# Patient Record
Sex: Male | Born: 1970 | Race: White | Hispanic: No | Marital: Single | Smoking: Former smoker
Health system: Southern US, Community
[De-identification: ages and names within clinical notes are randomized; demographics above are authoritative.]

## PROBLEM LIST (undated history)

## (undated) DIAGNOSIS — E119 Type 2 diabetes mellitus without complications: Secondary | ICD-10-CM

## (undated) DIAGNOSIS — F1411 Cocaine abuse, in remission: Secondary | ICD-10-CM

## (undated) DIAGNOSIS — E785 Hyperlipidemia, unspecified: Secondary | ICD-10-CM

## (undated) DIAGNOSIS — I82419 Acute embolism and thrombosis of unspecified femoral vein: Secondary | ICD-10-CM

## (undated) DIAGNOSIS — I2583 Coronary atherosclerosis due to lipid rich plaque: Secondary | ICD-10-CM

## (undated) DIAGNOSIS — Z8673 Personal history of transient ischemic attack (TIA), and cerebral infarction without residual deficits: Secondary | ICD-10-CM

## (undated) DIAGNOSIS — I639 Cerebral infarction, unspecified: Secondary | ICD-10-CM

## (undated) DIAGNOSIS — D689 Coagulation defect, unspecified: Secondary | ICD-10-CM

## (undated) DIAGNOSIS — I82409 Acute embolism and thrombosis of unspecified deep veins of unspecified lower extremity: Secondary | ICD-10-CM

## (undated) DIAGNOSIS — I2510 Atherosclerotic heart disease of native coronary artery without angina pectoris: Secondary | ICD-10-CM

## (undated) DIAGNOSIS — G08 Intracranial and intraspinal phlebitis and thrombophlebitis: Secondary | ICD-10-CM

## (undated) DIAGNOSIS — M1990 Unspecified osteoarthritis, unspecified site: Secondary | ICD-10-CM

## (undated) DIAGNOSIS — I219 Acute myocardial infarction, unspecified: Secondary | ICD-10-CM

## (undated) HISTORY — DX: Hyperlipidemia, unspecified: E78.5

## (undated) HISTORY — PX: HIP SURGERY: SHX245

## (undated) HISTORY — PX: WRIST SURGERY: SHX841

## (undated) HISTORY — DX: Cocaine abuse, in remission: F14.11

## (undated) HISTORY — PX: KNEE ARTHROSCOPY: SUR90

## (undated) HISTORY — DX: Coagulation defect, unspecified: D68.9

## (undated) HISTORY — DX: Personal history of transient ischemic attack (TIA), and cerebral infarction without residual deficits: Z86.73

## (undated) HISTORY — DX: Intracranial and intraspinal phlebitis and thrombophlebitis: G08

## (undated) HISTORY — DX: Coronary atherosclerosis due to lipid rich plaque: I25.83

## (undated) HISTORY — DX: Acute embolism and thrombosis of unspecified femoral vein: I82.419

## (undated) HISTORY — DX: Acute embolism and thrombosis of unspecified deep veins of unspecified lower extremity: I82.409

## (undated) HISTORY — DX: Atherosclerotic heart disease of native coronary artery without angina pectoris: I25.10

---

## 1998-12-08 ENCOUNTER — Emergency Department (HOSPITAL_COMMUNITY): Admission: EM | Admit: 1998-12-08 | Discharge: 1998-12-08 | Payer: Self-pay | Admitting: Emergency Medicine

## 1998-12-08 ENCOUNTER — Encounter: Payer: Self-pay | Admitting: Emergency Medicine

## 1999-04-28 ENCOUNTER — Ambulatory Visit (HOSPITAL_BASED_OUTPATIENT_CLINIC_OR_DEPARTMENT_OTHER): Admission: RE | Admit: 1999-04-28 | Discharge: 1999-04-28 | Payer: Self-pay | Admitting: Orthopedic Surgery

## 1999-08-11 ENCOUNTER — Ambulatory Visit (HOSPITAL_BASED_OUTPATIENT_CLINIC_OR_DEPARTMENT_OTHER): Admission: RE | Admit: 1999-08-11 | Discharge: 1999-08-11 | Payer: Self-pay | Admitting: Orthopedic Surgery

## 2000-01-13 ENCOUNTER — Encounter: Admission: RE | Admit: 2000-01-13 | Discharge: 2000-04-12 | Payer: Self-pay | Admitting: Orthopedic Surgery

## 2001-07-29 ENCOUNTER — Emergency Department (HOSPITAL_COMMUNITY): Admission: EM | Admit: 2001-07-29 | Discharge: 2001-07-29 | Payer: Self-pay | Admitting: Emergency Medicine

## 2001-07-29 ENCOUNTER — Encounter: Payer: Self-pay | Admitting: Emergency Medicine

## 2001-09-21 ENCOUNTER — Emergency Department (HOSPITAL_COMMUNITY): Admission: EM | Admit: 2001-09-21 | Discharge: 2001-09-21 | Payer: Self-pay | Admitting: Emergency Medicine

## 2002-11-07 ENCOUNTER — Emergency Department (HOSPITAL_COMMUNITY): Admission: EM | Admit: 2002-11-07 | Discharge: 2002-11-07 | Payer: Self-pay | Admitting: Emergency Medicine

## 2003-03-27 ENCOUNTER — Ambulatory Visit (HOSPITAL_COMMUNITY): Admission: RE | Admit: 2003-03-27 | Discharge: 2003-03-27 | Payer: Self-pay | Admitting: *Deleted

## 2003-06-30 ENCOUNTER — Emergency Department (HOSPITAL_COMMUNITY): Admission: EM | Admit: 2003-06-30 | Discharge: 2003-06-30 | Payer: Self-pay | Admitting: Family Medicine

## 2006-01-11 HISTORY — PX: CARDIAC CATHETERIZATION: SHX172

## 2006-08-11 ENCOUNTER — Ambulatory Visit: Payer: Self-pay | Admitting: Psychiatry

## 2006-08-11 ENCOUNTER — Inpatient Hospital Stay (HOSPITAL_COMMUNITY): Admission: AD | Admit: 2006-08-11 | Discharge: 2006-08-15 | Payer: Self-pay | Admitting: Psychiatry

## 2007-03-07 ENCOUNTER — Emergency Department (HOSPITAL_COMMUNITY): Admission: EM | Admit: 2007-03-07 | Discharge: 2007-03-08 | Payer: Self-pay | Admitting: Emergency Medicine

## 2007-03-30 ENCOUNTER — Ambulatory Visit (HOSPITAL_BASED_OUTPATIENT_CLINIC_OR_DEPARTMENT_OTHER): Admission: RE | Admit: 2007-03-30 | Discharge: 2007-03-30 | Payer: Self-pay | Admitting: Orthopedic Surgery

## 2007-04-13 ENCOUNTER — Ambulatory Visit (HOSPITAL_BASED_OUTPATIENT_CLINIC_OR_DEPARTMENT_OTHER): Admission: RE | Admit: 2007-04-13 | Discharge: 2007-04-13 | Payer: Self-pay | Admitting: Orthopedic Surgery

## 2007-11-08 ENCOUNTER — Inpatient Hospital Stay (HOSPITAL_COMMUNITY): Admission: EM | Admit: 2007-11-08 | Discharge: 2007-11-10 | Payer: Self-pay | Admitting: Emergency Medicine

## 2009-09-09 ENCOUNTER — Encounter: Admission: RE | Admit: 2009-09-09 | Discharge: 2009-09-09 | Payer: Self-pay | Admitting: Internal Medicine

## 2010-03-11 ENCOUNTER — Inpatient Hospital Stay (HOSPITAL_COMMUNITY): Admission: RE | Admit: 2010-03-11 | Payer: BC Managed Care – PPO | Source: Ambulatory Visit

## 2010-05-26 NOTE — H&P (Signed)
[name], [name] NO.:  [id number]   MEDICAL RECORD NO.:  [id number]          PATIENT TYPE:  IPS   LOCATION:  0504                          FACILITY:  [facility]   PHYSICIAN:  Geoffery Lyons, M.D.      DATE OF BIRTH:  [birth year]   DATE OF ADMISSION:  08/11/2006  DATE OF DISCHARGE:                       PSYCHIATRIC ADMISSION ASSESSMENT   A 40 year old single white male voluntarily admitted on August 11, 2006.   HISTORY OF PRESENT ILLNESS:  The patient presents with a history of drug  use.  Has been using crack cocaine, states a lot of it.  Crack for the  past 4 months, powder for 7 years.  Also has been drinking vodka up to a  half a gallon every 3-4 days, drinking in the morning, experiencing  blackouts.  Denies any seizure activity.  He states that he is at the  point now where he may use enough to kill himself.  His last drink was 6  days ago.  Reports no history of any withdrawal symptoms.  States when  he does drink it is more binge type drinking.  Sleep and appetite has  been satisfactory. He denies any hallucinations.  He states that in the  past he was told he was bipolar.   PAST PSYCHIATRIC HISTORY:  The first admission to [facility].  Was at [facility] before.  In the past has been on  Pamelor and Tegretol.  Reports a history of a suicide gesture in the  past by cutting.   SOCIAL HISTORY:  A 40 year old single white male with two children, 57  and 55 years of age.  He lives with his parents.  It is a new living  arrangement for him.  He works in Sport and exercise psychologist.  No  legal problems.   FAMILY HISTORY:  None.   ALCOHOL/DRUG HISTORY:  The patient smokes.  Denies any IV drug use.   Primary care [name] Holland is none.   MEDICAL PROBLEMS:  Denies any acute or major medical issues.   MEDICATIONS:  None.   DRUG ALLERGIES:  NO KNOWN ALLERGIES.   The patient reports a past history of coronary artery disease,  experiencing two MI's.   His last MI was 2 years ago.  Has had no current  follow-up.  Has not been on any medications.   REVIEW OF SYSTEMS:  The patient denies any fever, chills.  Reports a  decreased appetite with drug use.  Positive for chest pain in the past.  No shortness of breath.  No nausea, vomiting.  No dysuria.  No  headaches.  No seizures.  Positive for depression.  Positive for  polysubstance use.   PHYSICAL EXAMINATION:  Temperature is 98.5, 81 heart rate, 20  respirations, blood pressure 111/78, 6 feet 1 inch tall, 283 pounds.  This is a muscular male in no acute distress.  His head is atraumatic.  EOMs intact.  Negative lymphadenopathy.  CHEST:  Clear.  HEART:  Regular  rate and rhythm with no murmurs or gallops auscultated.  ABDOMEN:  A  soft abdomen with positive bowel sounds  auscultated.  PELVIC/GU:  deferred.  EXTREMITIES:  The patient moves all extremities.  No  clubbing, no deformity, no edema.  SKIN:  Tattoos are present but there  is no lacerations or rashes noted.  NEUROLOGIC:  Findings are intact.  Gait steady.  No tremors.   LABORATORY DATA:  CBC within normal limits.  TSH 2.286.   MENTAL STATUS EXAM:  He is fully alert, cooperative, good eye contact.  Speech is clear, normal pace and tone.  The patient's mood is depressed.  The patient's affect is pleasant, sad.  Thought process; no evidence of  any thought disorder.  Cognitive function intact.  Memory is good.  Judgment and insight is fair.  Concentration intact.  Average  intelligence.   AXIS I:  Polysubstance abuse.  Rule out dependence.  Rule out substance  induced mood disorder. Rule out bipolar disorder per history.  AXIS II:  Deferred.  AXIS III: History of coronary artery disease with status post MI 2 years  ago per patient.  AXIS IV:  Psychosocial problems related to chronic  drug use.  AXIS V:  Current is 45.   PLAN:  To contract for safety.  He will have Librium available on a  p.r.n. basis.  Work on relapse  prevention.  Will reconsider putting the  patient back on his Tegretol.  Will have trazodone for sleep.  The  patient is to attend groups.  Case manager is to assess any potential  rehab programs.  Encouraged the patient to obtain a family medical  doctor for history of coronary artery disease and for other health  maintenance issues.  Tentative length of stay is 4-5 days.      Landry Corporal, N.P.      Geoffery Lyons, M.D.  Electronically Signed    JO/MEDQ  D:  08/15/2006  T:  08/15/2006  Job:  [id number]

## 2010-05-26 NOTE — Cardiovascular Report (Signed)
[name] Holland, [name] Holland NO.:  [id number]   MEDICAL RECORD NO.:  [id number]          PATIENT TYPE:  AMB   LOCATION:  CATH                         FACILITY:  [facility]   PHYSICIAN:  Madaline Savage, M.D.DATE OF BIRTH:  22-Nov-1970   DATE OF PROCEDURE:  11/09/2007  DATE OF DISCHARGE:                            CARDIAC CATHETERIZATION   PROCEDURES PERFORMED:  1. Selective coronary angiography by Judkins technique.  2. Retrograde left heart catheterization.  3. Left ventricular angiography.  4. Intracoronary artery nitroglycerin administration for proximal      right coronary artery spasm successfully reversed.   COMPLICATIONS:  None.   ENTRY SITE:  Right femoral.   DYE USED:  Omnipaque.   CATHETERS USED:  5-French Judkins catheters.   PATIENT PROFILE:  [name] Holland is a 40 year old gentleman with a history  of chest pain occurring at rest starting in his left arm and radiating  down into the left side of his head, shoulders, and across his chest.  He complained of numbness in his arms and fingers and chest pressure.  There was associated shortness of breath and nausea without vomiting.  He reports that he has not used drugs in 4 months.  His drug screen at  [facility] was negative.  On arrival to [facility], his chest pain was still 6/10.  His EKG did not show any  ischemic change.  His history shows that he has multiple cardiac risk  factors including obesity with his current weight being about 315,  history of tobacco use, and family history of his father having had a  heart attack.  He has no known drug allergies.  Today's procedure was  performed by the right percutaneous femoral approach without any  complications.   RESULTS:  Pressures:  The patient's non-simultaneous central aortic and  left ventricular pressure was as follows.  Central aorta was 147/89.  His left ventricular pressure was 115/9, end-diastolic pressure 16.  These were non-simultaneous pressures.   ANGIOGRAPHIC RESULTS:  The patient had angiographically patent coronary  arteries throughout his coronary circulation!   Anatomically, the coronaries were consisted of a huge diameter but short  in length left main coronary artery.  There was a tiny intermediate  ramus branch coming off the left main.  LAD coursed to cardiac apex and  gave rise to one diagonal branch and the LAD itself was rather small.  The circumflex coronary artery was the largest vessel containing a large  trifurcating obtuse marginal branch arising from the midportion of the  circumflex.  There was a posterior descending and a posterolateral  branch coming off the distal circumflex and they were normal.   Right coronary artery was small to medium in size.  There was obvious  catheter induced spasm of the vessel.  It was reversed with 200 mcg of  intracoronary nitroglycerin and following that intervention, there was  no area of narrowing in this nondominant RCA.   Left French ventricular angiogram showed normal LV systolic function  with ejection fraction of 60%.  No wall motion abnormalities.   FINAL IMPRESSIONS:  1.  Angiographically patent coronary arteries.  2. Normal left ventricular systolic function.  3. Proximal right coronary artery pseudostenosis related to catheter-      induced spasm of that vessel.  4. Successful spasm of the proximal right coronary artery with 200 mcg      intracoronary nitroglycerin.  5. Normal left ventricular systolic function.   PLAN:  The patient will be returned to [facility] to  the [facility] and will be a candidate for discharge at the  time that is deemed appropriate by the [facility] Team.           ______________________________  Madaline Savage, M.D.     WHG/MEDQ  D:  11/09/2007  T:  11/10/2007  Job:  [id number]   cc:   [facility] Team

## 2010-05-26 NOTE — Op Note (Signed)
NAMECORNELIUS, MARULLO NO.:  [id number]   MEDICAL RECORD NO.:  [id number]          PATIENT TYPE:  AMB   LOCATION:  DSC                          FACILITY:  [facility]   PHYSICIAN:  Loreta Ave, M.D. DATE OF BIRTH:  January 25, 1970   DATE OF PROCEDURE:  04/13/2007  DATE OF DISCHARGE:                               OPERATIVE REPORT   PREOPERATIVE DIAGNOSIS:  Right knee medial plica and chondral injury  medial femoral condyle.   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   PROCEDURE:  Right knee exam under anesthesia, arthroscopy, chondroplasty  medial femoral condyle and patella, excision medial plica and extensive  lysis and debridement of anterior adhesions.   SURGEON:  Loreta Ave, M.D.   ASSISTANT:  Genene Churn. Barry Dienes, P.A.-C.   ANESTHESIA:  General.   ESTIMATED BLOOD LOSS:  Minimal.   SPECIMEN:  None.   CULTURES:  None.   COMPLICATIONS:  None.   DRESSING:  Soft compressive.   TOURNIQUET TIME:  Not employed.   PROCEDURE IN DETAIL:  The patient was brought to the operating room and  after adequate anesthesia had been obtained, the right knee examined.  Full motion, good stability.  Tourniquet and leg holder applied.  Leg  prepped and draped in the usual sterile fashion.  Three portals created,  one superolateral, one each medial and lateral parapatellar.  Inflow  catheter introduced, knee distended, arthroscope introduced, knee  inspected.  Patellofemoral joint had good tracking.  Some fragmentation  grade 2 and 3 just at the medial border debrided.  Extensive thickening,  arthrofibrosis entire fat extending into a large medial and a small  lateral plica.  All of this completely excised.  Lateral meniscus,  lateral compartment, cruciate ligaments  normal.  Medially, there was a  deep grade 3 lesion with loose chondral flaps right in the center of the  weight bearing dome about 1.5 cm in diameter.  Chondroplasty to the  stable surface.  Although deep, was not full thickness, so  microfracturing not indicated.  I made sure the margins were stable.  Chondral fragments were removed.  Medial meniscus intact.  Entire knee  examined and no other findings appreciated.  Instruments and fluid  removed.  Portals of the knee injected with Marcaine.  Portals closed  with nylon.  Sterile compressive dressing applied.  Anesthesia reversed.  Brought to the recovery room. Tolerated surgery well.  No complications.      Loreta Ave, M.D.  Electronically Signed     DFM/MEDQ  D:  04/13/2007  T:  04/13/2007  Job:  [id number]

## 2010-05-26 NOTE — H&P (Signed)
[name] Holland NO.:  [id number]   MEDICAL RECORD NO.:  [id number]          PATIENT TYPE:  INP   LOCATION:  1425                         FACILITY:  [facility]   PHYSICIAN:  Vania Rea, M.D. DATE OF BIRTH:  [birth year]   DATE OF ADMISSION:  11/08/2007  DATE OF DISCHARGE:                              HISTORY & PHYSICAL   PRIMARY CARE PHYSICIAN:  Unassigned.   CHIEF COMPLAINT:  Left arm and chest pain.   HISTORY OF THE PRESENT ILLNESS:  This is a 40 year old Caucasian  gentleman with a history of cocaine-induced acute MI about 3 years ago  in [place] who also has a history of bipolar disorder and reports that  he has been off all bipolar medications, and has not used cocaine in the  past 4 months.  However, for the past 2 days he has been having a numb-  tingling feeling in his left little and ring finger; and, today it  started to move up his arm into his chest and up into the left side of  his neck.  More recently he also noted ongoing numbness in his right  arm.  He states the chest pain feels like somebody is sitting on his  chest and is associated with some nausea, but there is no diaphoresis or  shortness of breath, or syncope.  He does have a history of sometimes  being short of breath on climbing stairs, but this gentleman is very  obese.  He has a history of snoring when asleep and his wife reports he  sometimes stops breathing in his sleep.  He has daytime somnolence.  He  has no palpitations and no lower extremity edema.  He has no history of  hypertension or diabetes.  He chews tobacco, 1 can a day, but he is not  currently smoking, does not use alcohol or illicit drugs.   PAST MEDICAL HISTORY:  1. 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  2. History of bipolar disorder.  3. Noncompliant with medications.   MEDICATIONS:  Noncompliant with Tegretol and Wellbutrin.   ALLERGIES:  No known drug allergies.   SOCIAL HISTORY:   The social history is as noted above.  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   FAMILY HISTORY:  The family history is positive for diabetes,  hypertension, coronary artery disease and cancers.   REVIEW OF SYSTEMS:  On review of systems, other than noted above, a 10-  point review of systems is unremarkable.   PHYSICAL EXAMINATION:  GENERAL APPEARANCE:  On physical exam this is an  obese young Caucasian gentleman lying flat on the stretcher in no acute  distress.  VITAL SIGNS:  The patient's temperature is 97.8, pulse is 86,  respirations are 18, blood pressure is 123/53, and he is saturating at  96% on room air.  GENERAL:  The patient's pain is described as 6/10.  HEENT:  Pupils are round, equal and react.  Mucous membranes are pink  and he is anicteric.  NECK:  He has no cervical lymphadenopathy, but has a  thick neck.  No  jugular venous distention observed.  CHEST:  The chest is clear to auscultation bilaterally.  HEART:  Cardiovascular system; regular rhythm.  ABDOMEN:  The patient's abdomen is obese, soft and nontender.  EXTREMITIES:  The patient's extremities are without edema.  He has 2+  pulses bilaterally.  MUSCULOSKELETAL:  The patient is tender over about the seventh cervical  vertebra and also tender in the supraclavicular area.  He has no chest  wall tenderness.  NEUROLOGIC EXAMINATION:  Central nervous system; cranial nerves II-XII  are grossly intact.  He has no focal neurological  deficit.   LABORATORY DATA:  White count is not recorded and his hemoglobin is  14.6.  His serum chemistries are unremarkable.  Cardiac enzymes are  negative.  His urine drug screen is negative for all tested substances.  His chest x-ray shows low lung volumes and no acute findings.   ASSESSMENT:  1. Atypical chest pain.  2. Obesity.  3. Probable obstructive sleep apnea.  4. Possible cervical musculoskeletal disease causing the above       symptoms.   PLAN:  1. Because of history of myocardial disease we will admit this      gentleman for cardiac enzymes.  2. We will order a 2-D echo.  3. We will get the medical records from [facility] in      [place].  4. We will cardiology for assistance with management.  5. We will also do a CT scan of the neck to rule out musculoskeletal      neck problems.      Vania Rea, M.D.  Electronically Signed     LC/MEDQ  D:  11/08/2007  T:  11/09/2007  Job:  [id number]

## 2010-05-26 NOTE — Op Note (Signed)
[name], Holland NO.:  [id number]   MEDICAL RECORD NO.:  [id number]          PATIENT TYPE:  AMB   LOCATION:  DSC                          FACILITY:  [facility]   PHYSICIAN:  Loreta Ave, M.D. 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  Medial plica.   POSTOPERATIVE DIAGNOSIS:  Left knee chondromalacia medial femoral  condyle and patella.  Medial plica.   PROCEDURE:  Left knee exam under anesthesia arthroscopy, excision medial  plica.  Chondroplasty apex of patella and centrally weightbearing dome  medial femoral condyle for grade II to III changes.  Removal chondral  loose bodies.   SURGEON:  Loreta Ave, M.D.   ASSISTANT:  Zonia Kief, PA   ANESTHESIA:  General.   BLOOD LOSS:  Minimal.   TOURNIQUET:  Not employed.   SPECIMENS:  None.   CULTURES:  None.   COMPLICATIONS:  None.   DRESSING:  Soft compressive.   PROCEDURE:  The patient brought to the operating room, placed on  operating table in the supine position.  After adequate anesthesia had  been obtained, left knee examined.  Full motion and good stability.  Good patellofemoral tracking.  Tourniquet leg holder applied.  Leg  prepped and draped in usual sterile fashion.  Three portals created, one  superolateral, one each medial and lateral parapatellar.  Inflow  catheter introduced in the knee, standard arthroscope was introduced,  knee inspected.  Large fibrotic medial plica with abrasive changes on  the condyle.  Excised in its entirety.  Some mild grade 3 chondral  changes with flaps apex and patella debrided to a stable surface.  Some  grade 2 changes over lateral border debrided.  Trochlea looked normal.  Excellent tracking.  No tethering.  Medially, there was a relatively  deep half-thickness grade 3  lesion right in the middle of the  weightbearing dome about a centimeter in diameter.  Chondroplasty to a  stable surface.  Not full-thickness.  Chondral loose bodies removed.  Medial tibial plateau, medial meniscus, lateral meniscus, cruciate  ligaments all intact.  Entire knee examined, no other findings  appreciated.  Instruments and fluid removed.  Portals of the knee  injected with Marcaine.  Portals closed with 4-0 nylon.  Sterile  compressive dressing applied.  Anesthesia reversed.  Brought to the  room.  Tolerated surgery well.  No complications.      Loreta Ave, M.D.  Electronically Signed     DFM/MEDQ  D:  03/30/2007  T:  03/30/2007  Job:  [id number]

## 2010-05-29 NOTE — Op Note (Signed)
[place]  Patient:    [name] Holland, [name] Holland                      MRN: [id number] Proc. Date: 04/28/99 Attending:  Katy Fitch. Naaman Plummer., M.D. CC:         Katy Fitch. Sypher, Montez Hageman., M.D. (2)                           Operative Report  PREOPERATIVE DIAGNOSIS:  Chronic right wrist ulnar sided pain, status post fall on outstretched right hand on February 06, 1999.  Rule out peripheral triangular fibrocartilage tear.  POSTOPERATIVE DIAGNOSIS:  Extensive peripheral and dorsal triangular fibrocartilage tear with associated minor hyaline articular chondral injury and lunatotriquetral interosseous ligament partial tear.  SURGEON:  Katy Fitch. Naaman Plummer., M.D.  OPERATION PERFORMED: 1. Diagnostic arthroscopy, right wrist. 2. Arthroscopic debridement of synovitis and hyaline articular cartilage    injury/lunatotriquetral interosseous ligament injury. 3. Arthroscopic reconstruction of peripheral triangular fibrocartilage tear    with 0 Tycron suture x 2.  ANESTHESIA:  General orotracheal.  SUPERVISING ANESTHESIOLOGIST:  Dr. Krista Blue.  INDICATIONS FOR PROCEDURE:  [name] Holland is a 40 year old man who fell six feet off a ladder onto his outstretched right hand on February 06, 1999.  He was initially evaluated in outpatient [facility] where he was noted to have normal x-rays.  He continued to have ulnar sided wrist pain; therefore, a hand surgery consult was requested.  Clinical examination suggested a peripheral triangular fibrocartilage tear.  Repeat plain films did not reveal signs of a fracture or instability.  There was some widening of the distal radial ulnar joint suggesting a substantial triangular fibrocartilage tear with disruption of the radial ulnar ligaments.  Due to his mechanical symptoms and x-ray abnormality he is scheduled for diagnostic arthroscopy at this time anticipating repair of his triangular fibrocartilage.  DESCRIPTION OF  PROCEDURE:  [name] Holland was brought to the operating room and placed in supine position on the operating table.  Following induction of general orotracheal anesthesia, he was carefully positioned in supine position and his right arm prepped with Betadine soap and solution and sterilely draped.  Following exsanguination of the limb with an Esmarch bandage, the arterial tourniquet was inflated to 240 mmHg.  The procedure commenced with distraction of his wrist in a tower designed for wrist arthroscopy.  Finger traps were applied to the index and long fingers and countertraction on the forearm.  10 pounds distraction was applied.  The scope was introduced with standard blunt technique through a 3-4 dorsal portal.  Diagnostic arthroscopy revealed intact hyaline articular cartilage surfaces on the scaphoid, most of the lunate and triquetrum.  The dorsal ulnar aspect of the lunate had an area of partial thickness cartilage injury with a flap tear.  This was photographically documented.  There was a small tear of the lunatotriquetral interosseous ligament and reactive synovitis adjacent to it.  The radial carpal ligaments were intact.  The ulnar carpal ligaments were intact and the radial attachment of the triangular fibrocartilage was intact. There was a significant dorsal and ulnar peripheral triangular fibrocartilage tear with reactive synovitis surrounding it.  This was photographically documented and subsequently thoroughly debrided with a suction shaver brought in through a 6R portal.  After debridement of the hyaline articular cartilage and the LT ligament flap, the margins of the triangular fibrocartilage tear were roughened with the suction shaver.  A Tuohy needle  was used with inside out technique to place two 0 Tycron sutures.  This led to a satisfactory closure of the peripheral tear.  The Tycron sutures were tied over the ulnar and dorsal capsule taking care to avoid the dorsal  ulnar sensory branch and the large caliber dorsal veins.  The tear was satisfactorily closed and photographic documentation completed.  The portals were then infiltrated with 0.25% Marcaine and repaired with intradermal 3-0 Prolene suture.  The wrist was immobilized with Xeroflo, sterile gauze and sterile Webril dressing followed by application of a sugartong splint maintaining the forearm in full supination.  There were no apparent complications.  [name] Holland was awakened from anesthesia and transferred to the recovery room with stable vital signs.  He will be discharged with prescriptions for Levaquin 500 mg 1 p.o. q.d. x 5 days as a prophylactic antibiotic and Percocet 5/325 1 to 2 tablets p.o. q.4-6h. p.r.n. for pain, 24 tablets without refill. DD:  04/28/99 TD:  04/28/99 Job: [id number]

## 2010-05-29 NOTE — Op Note (Signed)
[place]. [facility]  Patient:    [name] Holland                      MRN: [id number] Proc. Date: 08/11/99 Adm. Date:  [id number] Attending:  Susa Day                           Operative Report  PREOPERATIVE DIAGNOSIS:  Status post repair of peripheral triquetral cartilage tear, right wrist with development of recurrent pain approximately four weeks following repair, unresponsive to splinting, activity modification, and therapy.  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  OPERATIONS: 1. Diagnostic arthroscopy of right wrist. 2. Arthroscopic synovectomy and resection of a dorsal plica with abrasion    chondroplasty of dorsal ulnar aspect of lunate.  SURGEON:  [name] Holland, [name] Hageman., M.D.  ASSISTANT:  [name] Holland, P.A.  ANESTHESIA:  Axillary block supplied by anesthesiologist, [name] Holland, M.D.  INDICATIONS:  [name] Holland is a 40 year old man who sustained an injury to his right wrist approximately six months prior.  He developed ulnar sided wrist pain and presented for a consultation in early 2001.  Clinical examination suggested a peripheral triquetral cartilage tear.  He was brought to the operating room for diagnostic arthroscopy, was found to have a peripheral triquetral cartilage tear, and underwent repair approximately three months ago.  Four weeks following his repair, he began to experience significant recurrent ulnar sided wrist pain after an episode where he had to catch a child and strained his wrist.  Clinical examination subsequently suggested possible recurrent internal derangement.  We rested him for a period of six weeks without relief.  Due to his constant mechanical symptoms, he is returned to the operating room at this time for  diagnostic arthroscopy and appropriate intervention.  DESCRIPTION OF PROCEDURE:  [name] Holland was brought to the operating room and placed in the supine position on the operating table.  Axillary placed by [name] Holland, M.D., in the holding area led to satisfactory anesthesia of the right arm.  The arm was prepped with Betadine soap and solution and sterilely draped.  Upon exsanguination of the limb with Esmarch bandage, the arterial tourniquet was inflated to 220 mmHg.  The procedure commenced with distraction of the wrist in the tower designed for wrist arthroscopy.  Ten pounds of traction were applied to the fingertips and the index and long fingers and retraction on the forearm.  The scope was introduced through a standard 3-4 dorsal portal with blunt technique.  Diagnostic arthroscopy revealed intact articular surfaces on the scaphoid lunate and triquetral, palmar, and intermediate surfaces.  There was an area of near full-thickness chondromalacia on the dorsal aspect of the lunate.  I could not visualize the triquetrum due to a large dorsal synovial shelf consistent with a plica.  The scope was maneuvered past the plica and the peripheral triquetral for cartilage repair inspected.  There was noted to be a very satisfactory scar formation at the site of the repair and the Ti-Cron sutures placed previously were not visible.  The triquetral cartilage was carefully palpated with a nerve hook through a 6R portal and was found to have satisfactory tension. Minimal synovitis was noted.  The 6R portal was established with blunt technique and a suction shaver used to debride the  synovitis.  The scope was then placed in the 6R portal and the synovial shelf inspected.  This was documented photographically.  There was noted to be a kissing chondromalacia lesion on the dorsal ulnar aspect of the lunate.  A suction shaver was placed on 3-4 and used to remove the dorsal synovial shelf.  The  margins of the resected synovium were smoothed with a suction shaver followed by inspection of the entire triquetral cartilage. There was noted to be a partial thickness tear of the radial palmar attachment of the triquetral cartilage that was debrided.  The triquetral cartilage was thoroughly debrided and no other lesions identified.  The portals were then repaired with interrupted sutures of 5-0 nylon followed by application Xeroflo, sterile gauze, and a volar plaster splint.  [name] Holland was awakened from anesthesia and transferred to the recovery room in stable condition.  He will be discharged with prescriptions for Percocet, Motrin, and Keflex.  He will return to our office where he will follow up in a week or sooner if there are any problems. DD:  08/11/99 TD:  08/12/99 Job: [id number]

## 2010-05-29 NOTE — Discharge Summary (Signed)
NAMERUSSEL, MORAIN              ACCOUNT NO.:  [id number]   MEDICAL RECORD NO.:  [id number]          PATIENT TYPE:  INP   LOCATION:  1425                         FACILITY:  [facility]   PHYSICIAN:  Hind I Elsaid, MD      DATE OF BIRTH:  1970/10/12   DATE OF ADMISSION:  11/08/2007  DATE OF DISCHARGE:  11/10/2007                               DISCHARGE SUMMARY   DISCHARGE DIAGNOSES:  1. Atypical chest pain.  2. Hyperlipidemia.  3. Left 2 medial finger pain, resolved.  Possibly secondary to ulnar      compression.  4. Obesity.  5. History of obstructive sleep apnea.  6. 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   DISCHARGE MEDICATIONS:  Crestor 10 mg at bedtime.   PROCEDURE:  Cardiac cath which is normal.   CONSULTATIONS:  [facility] consulted for chest  pain.   HISTORY OF PRESENT ILLNESS:  This is a 40 year old Caucasian male with a  history of cocaine-induced acute MI, about 3 years ago, admitted to the  hospital secondary to chest pain with pressure radiating to the left arm  with some numbness of his fingers.  The patient was admitted to the  hospital.  Cardiac enzymes were re-cycled.  Cardiology consulted.  He  underwent cardiac cath.  Results were negative.  The patient was noticed  to have hyperlipidemia, started on Crestor.  The patient was counseled  regarding obesity.  Had a CT scan of his neck to rule out  musculoskeletal neck problems, which was negative.  We felt the left  finger pain was secondary to ulnar nerve compression, and he needs to  follow up with his primary care physician as an outpatient.  The patient  has good peripheral pulses.  At this time, the patient is medically  stable for discharge home.      Hind Bosie Helper, MD  Electronically Signed     HIE/MEDQ  D:  12/01/2007  T:  12/01/2007  Job:  [id number]

## 2010-05-29 NOTE — Discharge Summary (Signed)
NAMERENOLD, KOZAR NO.:  [id number]   MEDICAL RECORD NO.:  [id number]          PATIENT TYPE:  IPS   LOCATION:  0504                          [facility]ACILITY[facility]  [facility]   PHYSICIAN:  Geoffery Lyons, M.D.      DATE OF BIRTH:  [birth year]   DATE OF ADMISSION:  08/11/2006  DATE OF DISCHARGE:  08/15/2006                               DISCHARGE SUMMARY   CHIEF COMPLAINT AND PRESENT ILLNESS:  This was the first admission to  [facility] for this 40 year old single white male  voluntarily admitted.  Had been using crack cocaine, states a lot of it.  Crack for the past four months.  Powder for seven years.  Also drinking  vodka up to half a gallon every three or four days, drinking in the  morning, experiencing blackouts.  Denies any seizure activity.  He was  at a point, he claimed, that he would like to use enough to kill  himself.  Last drink six days prior to this admission.  No history of  withdrawal.  Drinking more so binge-type.   PAST PSYCHIATRIC HISTORY:  First time at [facility].  Was at  [facility] in the past.  Has been on Pamelor and Tegretol.  Past  history of suicide gesture by cutting.   ALCOHOL/DRUG HISTORY:  As already stated, persistent use of cocaine,  snorting and then crack and some alcohol and some binge-drinking.   MEDICAL HISTORY:  Noncontributory.   MEDICATIONS:  None.   PHYSICAL EXAMINATION:  Performed and failed to show any acute findings.   LABORATORY DATA:  CBC revealed white blood cells 7.5, hemoglobin 14.7.  Sodium 143, potassium 4.0, glucose 117.  SGOT 21, SGPT 35, total  bilirubin 0.3, TSH 2.586.   MENTAL STATUS EXAM:  Alert, cooperative male, good eye contact.  Speech  is clear, normal rate, tempo and production.  Mood is depressed.  Affect  is depressed.  Thought processes are clear, rational and goal-oriented.  No evidence of delusions.  No active suicidal or homicidal ideation.  No  hallucinations.  Cognition  well-preserved.   ADMISSION DIAGNOSES:  AXIS I:  Cocaine dependence.  Alcohol abuse; rule  out dependence.  Rule out substance-induced mood disorder.  AXIS II:  No diagnosis.  AXIS III:  History of coronary artery disease, status post myocardial  infarction.  AXIS IV:  Moderate.  AXIS V:  GAF upon admission 35; highest GAF in the last year 70.   HOSPITAL COURSE:  He was admitted.  He was started in individual and  group psychotherapy.  He was given trazodone for sleep.  He was  basically started on Tegretol.  As already stated, crack for the last  four months, seven years cocaine.  He was abstinent for five months in a  halfway house.  Depression got to him.  He claimed as a teenager had  mood swings.  As a teenager, he went to [facility] in [place]  for eight months.  He was placed on Tegretol for mood swings.  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  Family  history of alcohol use.  Alcohol half a gallon every 3-4 days since he  was 22 except for the five months.  Had been on Pamelor and then  Tegretol.  Endorsed mood swings, no happy medium, negative thoughts,  depression or rage, tried to overdose on cocaine.  Endorsed that he felt  he was not going to be able to make it if he was not to going to go into  a residential treatment program.  We put him back on Tegretol.  He was  tolerating the Tegretol pretty well.  On August 14, 2006, he was still  dealing with a lot of the cravings so was started Symmetrel 100 twice a  day.  No side effects to the Tegretol.  Was wanting to pursue it.  On  August 15, 2006, he was in full contact with reality.  He was going to go  into [facility].  He was going to pursue further treatment  through them.  Endorsed he was motivated and committed to abstaining.   DISCHARGE DIAGNOSES:  AXIS I:  Cocaine dependence.  Alcohol dependence.   Mood disorder not otherwise specified.  AXIS II:  No diagnosis.  AXIS III:  Status post myocardial infarction.  AXIS IV:  Moderate.  AXIS V:  GAF upon discharge 55-60.   DISCHARGE ACTIVITIES:  1. Symmetrel 100 mg twice a day.  2. Wellbutrin XL 150 mg per day.  3. Tegretol 200 mg twice a day.   FOLLOWUP:  [facility].      Geoffery Lyons, M.D.  Electronically Signed     IL/MEDQ  D:  09/01/2006  T:  09/02/2006  Job:  [id number]

## 2010-10-02 LAB — URINALYSIS, ROUTINE W REFLEX MICROSCOPIC
Bilirubin Urine: NEGATIVE
Glucose, UA: NEGATIVE
Nitrite: NEGATIVE
Specific Gravity, Urine: 1.031 — ABNORMAL HIGH
pH: 5.5

## 2010-10-02 LAB — CBC
HCT: 43.6
Hemoglobin: 14.9
RBC: 4.91
RDW: 13.9
WBC: 13.3 — ABNORMAL HIGH

## 2010-10-02 LAB — DIFFERENTIAL
Basophils Absolute: 0.2 — ABNORMAL HIGH
Eosinophils Relative: 1
Lymphocytes Relative: 20
Lymphs Abs: 2.6
Monocytes Absolute: 0.8
Monocytes Relative: 6
Neutro Abs: 9.6 — ABNORMAL HIGH

## 2010-10-02 LAB — I-STAT 8, (EC8 V) (CONVERTED LAB)
Acid-base deficit: 1
BUN: 16
Chloride: 106
HCT: 48
Operator id: 277751
Potassium: 3.4 — ABNORMAL LOW
pH, Ven: 7.372 — ABNORMAL HIGH

## 2010-10-02 LAB — RAPID URINE DRUG SCREEN, HOSP PERFORMED
Barbiturates: NOT DETECTED
Cocaine: POSITIVE — AB

## 2010-10-02 LAB — URINE MICROSCOPIC-ADD ON

## 2010-10-02 LAB — POCT CARDIAC MARKERS
CKMB, poc: 4.1
Myoglobin, poc: 112

## 2010-10-02 LAB — POCT I-STAT CREATININE: Operator id: 277751

## 2010-10-05 LAB — POCT HEMOGLOBIN-HEMACUE: Hemoglobin: 14.7

## 2010-10-06 LAB — POCT HEMOGLOBIN-HEMACUE: Hemoglobin: 14.1

## 2010-10-12 LAB — CBC
HCT: 42
Platelets: 143 — ABNORMAL LOW
Platelets: 182
RBC: 4.45
WBC: 7.7
WBC: 9.7

## 2010-10-12 LAB — DIFFERENTIAL
Eosinophils Relative: 1
Lymphocytes Relative: 26
Lymphs Abs: 2.5

## 2010-10-12 LAB — POCT I-STAT, CHEM 8
Calcium, Ion: 1.14
HCT: 43
Hemoglobin: 14.6
TCO2: 27

## 2010-10-12 LAB — TSH: TSH: 6.605 — ABNORMAL HIGH

## 2010-10-12 LAB — POCT CARDIAC MARKERS: Myoglobin, poc: 131

## 2010-10-12 LAB — LIPID PANEL
HDL: 22 — ABNORMAL LOW
Total CHOL/HDL Ratio: 9.3
Triglycerides: 211 — ABNORMAL HIGH
VLDL: 42 — ABNORMAL HIGH

## 2010-10-12 LAB — HOMOCYSTEINE: Homocysteine: 6.9

## 2010-10-12 LAB — BASIC METABOLIC PANEL
BUN: 11
BUN: 13
CO2: 31
Chloride: 105
Creatinine, Ser: 0.96
GFR calc non Af Amer: >60
Glucose, Bld: 119 — ABNORMAL HIGH
Potassium: 3.6
Sodium: 139

## 2010-10-12 LAB — CARDIAC PANEL(CRET KIN+CKTOT+MB+TROPI)
Total CK: 231
Troponin I: <0.01
Troponin I: <0.01

## 2010-10-12 LAB — RAPID URINE DRUG SCREEN, HOSP PERFORMED
Amphetamines: NOT DETECTED
Tetrahydrocannabinol: NOT DETECTED

## 2010-10-12 LAB — MAGNESIUM: Magnesium: 2

## 2010-10-12 LAB — HEMOGLOBIN A1C: Hgb A1c MFr Bld: 6

## 2010-10-26 LAB — COMPREHENSIVE METABOLIC PANEL
Alkaline Phosphatase: 79
BUN: 15
CO2: 27
Chloride: 111
GFR calc non Af Amer: >60
Glucose, Bld: 117 — ABNORMAL HIGH
Potassium: 4
Total Bilirubin: 0.3

## 2010-10-26 LAB — CBC
HCT: 42.2
Hemoglobin: 14.7
MCV: 87.2
WBC: 7.5

## 2010-10-26 LAB — CARBAMAZEPINE LEVEL, TOTAL: Carbamazepine Lvl: 5.4

## 2010-10-26 LAB — TSH: TSH: 2.586

## 2011-12-09 ENCOUNTER — Emergency Department (HOSPITAL_COMMUNITY)
Admission: EM | Admit: 2011-12-09 | Discharge: 2011-12-09 | Disposition: A | Discharge status: Home / Self Care | Payer: Self-pay | Attending: Emergency Medicine | Admitting: Emergency Medicine

## 2011-12-09 ENCOUNTER — Encounter (HOSPITAL_COMMUNITY): Payer: Self-pay | Admitting: Emergency Medicine

## 2011-12-09 ENCOUNTER — Emergency Department (HOSPITAL_COMMUNITY): Payer: Self-pay

## 2011-12-09 DIAGNOSIS — R300 Dysuria: Secondary | ICD-10-CM | POA: Insufficient documentation

## 2011-12-09 DIAGNOSIS — M545 Low back pain, unspecified: Secondary | ICD-10-CM | POA: Insufficient documentation

## 2011-12-09 DIAGNOSIS — N410 Acute prostatitis: Secondary | ICD-10-CM

## 2011-12-09 DIAGNOSIS — N509 Disorder of male genital organs, unspecified: Secondary | ICD-10-CM | POA: Insufficient documentation

## 2011-12-09 DIAGNOSIS — R509 Fever, unspecified: Secondary | ICD-10-CM | POA: Insufficient documentation

## 2011-12-09 DIAGNOSIS — Z79899 Other long term (current) drug therapy: Secondary | ICD-10-CM | POA: Insufficient documentation

## 2011-12-09 DIAGNOSIS — R319 Hematuria, unspecified: Secondary | ICD-10-CM | POA: Insufficient documentation

## 2011-12-09 LAB — BASIC METABOLIC PANEL
Calcium: 9.2 mg/dL (ref 8.4–10.5)
Chloride: 97 mEq/L (ref 96–112)
Creatinine, Ser: 0.87 mg/dL (ref 0.50–1.35)
GFR calc Af Amer: >90 mL/min (ref >90)
Sodium: 133 mEq/L — ABNORMAL LOW (ref 135–145)

## 2011-12-09 LAB — CBC WITH DIFFERENTIAL/PLATELET
Basophils Absolute: 0 10*3/uL (ref 0.0–0.1)
Basophils Relative: 0 % (ref 0–1)
HCT: 41.3 % (ref 39.0–52.0)
MCHC: 32.9 g/dL (ref 30.0–36.0)
Monocytes Absolute: 1 10*3/uL (ref 0.1–1.0)
Neutro Abs: 9.3 10*3/uL — ABNORMAL HIGH (ref 1.7–7.7)
Neutrophils Relative %: 73 % (ref 43–77)
Platelets: 150 10*3/uL (ref 150–400)
RDW: 13.6 % (ref 11.5–15.5)

## 2011-12-09 LAB — URINALYSIS, ROUTINE W REFLEX MICROSCOPIC
Ketones, ur: NEGATIVE mg/dL
Nitrite: NEGATIVE
Protein, ur: 100 mg/dL — AB
pH: 6.5 (ref 5.0–8.0)

## 2011-12-09 MED ORDER — SODIUM CHLORIDE 0.9 % IV SOLN
INTRAVENOUS | Status: DC
  Administered 2011-12-09: 11:00:00 via INTRAVENOUS

## 2011-12-09 MED ORDER — CIPROFLOXACIN HCL 500 MG PO TABS
500.0000 mg | ORAL_TABLET | Freq: Once | ORAL | Status: AC
  Administered 2011-12-09: 500 mg via ORAL
  Filled 2011-12-09: qty 1

## 2011-12-09 MED ORDER — CIPROFLOXACIN HCL 500 MG PO TABS: 500.0000 mg | ORAL_TABLET | Freq: Two times a day (BID) | ORAL | Status: DC

## 2011-12-09 MED ORDER — SODIUM CHLORIDE 0.9 % IV BOLUS (SEPSIS)
1000.0000 mL | Freq: Once | INTRAVENOUS | Status: AC
  Administered 2011-12-09: 1000 mL via INTRAVENOUS

## 2011-12-09 MED ORDER — ONDANSETRON 8 MG PO TBDP: 8.0000 mg | ORAL_TABLET | Freq: Three times a day (TID) | ORAL | Status: DC | PRN

## 2011-12-09 MED ORDER — ACETAMINOPHEN 325 MG PO TABS
650.0000 mg | ORAL_TABLET | Freq: Once | ORAL | Status: DC
  Filled 2011-12-09: qty 2

## 2011-12-09 MED ORDER — ONDANSETRON HCL 4 MG/2ML IJ SOLN
4.0000 mg | Freq: Once | INTRAMUSCULAR | Status: AC
  Administered 2011-12-09: 4 mg via INTRAVENOUS
  Filled 2011-12-09: qty 2

## 2011-12-09 MED ORDER — IBUPROFEN 600 MG PO TABS: 600.0000 mg | ORAL_TABLET | Freq: Four times a day (QID) | ORAL | Status: DC | PRN

## 2011-12-09 NOTE — ED Notes (Signed)
Pt c/o fever yesterday with pain in right flank/lower back area; pt sts hematuria that this am with some abd pain

## 2011-12-09 NOTE — ED Provider Notes (Signed)
History     CSN: [id number]  Arrival date & time 12/09/11  1009   First MD Initiated Contact with Patient 12/09/11 1017      Chief Complaint  Patient presents with  . Hematuria  . Fever  . Flank Pain    (Consider location/radiation/quality/duration/timing/severity/associated sxs/prior treatment) HPI Comments: Patient with past urinary tract problems or kidney stones -- presents with complaint of fever yesterday to 102F and gradually worsening left lower back pain, dysuria, hematuria with clots noted this morning. Pain radiates into R testicle. No headache, upper respiratory infection symptoms, chest pain, shortness of breath, change in stools, pain with defecation. Patient denies urethral discharge. No skin changes or rashes. Patient took Tylenol yesterday with some relief. Onset acute. Course is gradually worsening. Nothing makes the symptoms worse.  The history is provided by the patient.    History reviewed. No pertinent past medical history.  History reviewed. No pertinent past surgical history.  History reviewed. No pertinent family history.  History  Substance Use Topics  . Smoking status: Never Smoker   . Smokeless tobacco: Not on file  . Alcohol Use: No      Review of Systems  Constitutional: Positive for fever and chills.  HENT: Negative for sore throat and rhinorrhea.   Eyes: Negative for redness.  Respiratory: Negative for cough.   Cardiovascular: Negative for chest pain.  Gastrointestinal: Negative for nausea, vomiting, abdominal pain and diarrhea.  Genitourinary: Positive for dysuria, hematuria, flank pain and testicular pain. Negative for frequency, discharge, penile swelling and difficulty urinating.  Musculoskeletal: Negative for myalgias.  Skin: Negative for rash.  Neurological: Negative for headaches.    Allergies  Review of patient's allergies indicates no known allergies.  Home Medications   Current Outpatient Rx  Name  Route  Sig   Dispense  Refill  . CARBAMAZEPINE 200 MG PO TABS   Oral   Take 100 mg by mouth 2 (two) times daily.         Marland Kitchen FLUOXETINE HCL 20 MG PO CAPS   Oral   Take 20 mg by mouth daily.           BP 141/93  Pulse 102  Temp 98.9 F (37.2 C) (Oral)  Resp 18  SpO2 95%  Physical Exam  Nursing note and vitals reviewed. Constitutional: He appears well-developed and well-nourished.  HENT:  Head: Normocephalic and atraumatic.  Eyes: Conjunctivae normal are normal. Right eye exhibits no discharge. Left eye exhibits no discharge.  Neck: Normal range of motion. Neck supple.  Cardiovascular: Normal rate, regular rhythm and normal heart sounds.   Pulmonary/Chest: Effort normal and breath sounds normal.  Abdominal: Soft. There is no tenderness. There is CVA tenderness (Left-sided). There is no rigidity, no rebound and no guarding.  Genitourinary: Right testis shows tenderness (mild). Right testis shows no mass and no swelling. Right testis is descended. Left testis shows no mass, no swelling and no tenderness. Left testis is descended. Circumcised. No penile erythema or penile tenderness. No discharge found.  Neurological: He is alert.  Skin: Skin is warm and dry.  Psychiatric: He has a normal mood and affect.    ED Course  Procedures (including critical care time)  Labs Reviewed  CBC WITH DIFFERENTIAL - Abnormal; Notable for the following:    WBC 12.7 (*)     Neutro Abs 9.3 (*)     All other components within normal limits  BASIC METABOLIC PANEL - Abnormal; Notable for the following:    Sodium 133 (*)  All other components within normal limits  URINALYSIS, ROUTINE W REFLEX MICROSCOPIC - Abnormal; Notable for the following:    APPearance CLOUDY (*)     Specific Gravity, Urine 1.003 (*)     Hgb urine dipstick LARGE (*)     Protein, ur 100 (*)     Leukocytes, UA MODERATE (*)     All other components within normal limits  URINE MICROSCOPIC-ADD ON  URINE CULTURE   Ct Abdomen Pelvis  Wo Contrast  12/09/2011  *RADIOLOGY REPORT*  Clinical Data: Hematuria, back and lower abdominal pain, fever  CT ABDOMEN AND PELVIS WITHOUT CONTRAST  Technique:  Multidetector CT imaging of the abdomen and pelvis was performed following the standard protocol without intravenous contrast.  Comparison: None.  Findings:  The lack of intravenous contrast limits the ability to evaluate solid abdominal organs.  Examination is further degraded secondary to patient body habitus.  Prostatic calcifications.  The urinary bladder appears minimally thick walled.  No free fluid within the pelvis, however stranding is noted adjacent to the base of the urinary bladder, prostate and seminal vesicles.  Incidental note is made of a prominent perirectal lymph node measuring 6 mm in short axis diameter (image 98, series 2).  Colonic diverticulosis.  There is a minimal amount of mesenteric stranding adjacent to the sigmoid colon within the left hemi pelvis (images 89 and 90, series 2).  The bowel is otherwise normal in course and caliber without wall thickening or evidence of obstruction.  Normal appearance of the appendix.  No pneumoperitoneum, pneumatosis or portal venous gas.  Normal hepatic contour.  There is diffuse decreased attenuation of the hepatic parenchyma suggestive of hepatic steatosis.  Normal noncontrast appearance of the gallbladder.  No ascites.  Normal noncontrast appearance of the bilateral kidneys.  No renal stones or evidence of urinary obstruction.  No perinephric stranding.  Normal noncontrast appearance of the bilateral adrenal glands, pancreas and spleen, though note, the spleen is borderline enlarged.  Normal caliber of the abdominal aorta.  Incidental note is made of a prominent perirectal lymph node measuring 6 mm in short axis diameter (image 98, series 2).  No retroperitoneal, mesenteric or inguinal lymphadenopathy on this noncontrast examination.  Limited visualization of the lower thorax is negative for  focal airspace opacity or pleural effusion.  Normal heart size.  No pericardial effusion.  No acute or aggressive osseous abnormalities.  L5 - S1 degenerative change. Bilateral mesenteric fat containing indirect inguinal hernias, left greater than right.  IMPRESSION:  1.  Mild thickening of the urinary bladder wall, which while possibly secondary to under distension, is worrisome for an infectious or inflammatory process.  Correlation with urinalysis is recommended. 2.  Nonspecific stranding centered within the lower pelvis center around the prostate and seminal vesicles.  Clinical correlation for an STD or prostatitis is advised.  3.  Prominent left-sided perirectal lymph node is presumably reactive in etiology.  If not recently performed, further evaluation with non emergent colonoscopy is recommended. 4.  Colonic diverticulosis with minimal mesenteric stranding adjacent to the sigmoid colon within the left lobe lower abdominal quadrant, while possibly an extension of the inflammatory process centered within the lower pelvis, early uncomplicated diverticulitis may have a similar appearance.  5.  No evidence of nephrolithiasis or urinary obstruction. 6.  Hepatic steatosis with borderline splenomegaly.  Correlation with LFTs is recommended.   Original Report Authenticated By: Tacey Ruiz, MD      1. Acute prostatitis with hematuria     10:23  AM Patient not yet in room.   10:33 AM Patient seen and examined. Work-up initiated. Pain medication deferred.   Vital signs reviewed and are as follows: Filed Vitals:   12/09/11 1017  BP: 141/93  Pulse: 102  Temp: 98.9 F (37.2 C)  Resp: 18   12:15 PM Patient d/w Dr. Patria Mane. CT abd/pelvis without contrast ordered.  CT suggests acute prostatitis/cystitis? Will treat with cipro for 14 days. Urology f/u encouraged, especially in light of prostatic calcifications. Patient agrees. Urged return with high persistent fever, persistent vomiting, worsening  symptoms, or other concerns. He will use tylenol/ibuprofen for pain.   Patient verbalizes understanding and agrees with plan.     MDM  Acute prostatitis likely cause of fever, chills, hematuria. Patient improved in ED and is tolerating PO meds. He is appropriate for outpatient treatment with PO meds, urology follow-up with prostatic calcifications. Doubt STD etiology given history, calcifications.         Renne Crigler, [place] 12/09/11 [phone]

## 2011-12-09 NOTE — ED Provider Notes (Signed)
Medical screening examination/treatment/procedure(s) were performed by non-physician practitioner and as supervising physician I was immediately available for consultation/collaboration.   Lyanne Co, MD 12/09/11 [phone]

## 2011-12-09 NOTE — ED Notes (Signed)
Pt presents to department for evaluation of hematuria, dysuria and abdominal pain. Pt states that he had fever of 102.0 last night, also states lower back pain, and body aches. States he woke up this morning and began passing large blood clots in urine. 3/10 pain at the time. He is alert and oriented x4. Pt is recovering from narcotic abuse. No signs of distress noted at the time.

## 2011-12-09 NOTE — ED Notes (Signed)
Pt transported to CT scan.

## 2011-12-09 NOTE — ED Notes (Signed)
Pt returned to exam room. 

## 2011-12-09 NOTE — ED Notes (Signed)
Pt took 500mg  of Tylenol from home, PA notified.

## 2011-12-11 LAB — URINE CULTURE: Colony Count: 70000

## 2011-12-12 NOTE — ED Notes (Signed)
+  Urine. Patient treated with Cipro. Sensitive to same. Per protocol MD. °

## 2012-01-12 DIAGNOSIS — F1411 Cocaine abuse, in remission: Secondary | ICD-10-CM

## 2012-01-12 HISTORY — DX: Cocaine abuse, in remission: F14.11

## 2012-06-06 ENCOUNTER — Encounter (HOSPITAL_BASED_OUTPATIENT_CLINIC_OR_DEPARTMENT_OTHER): Payer: Self-pay | Admitting: *Deleted

## 2012-06-06 ENCOUNTER — Emergency Department (HOSPITAL_BASED_OUTPATIENT_CLINIC_OR_DEPARTMENT_OTHER)
Admission: EM | Admit: 2012-06-06 | Discharge: 2012-06-07 | Disposition: A | Discharge status: Home / Self Care | Payer: Self-pay | Attending: Emergency Medicine | Admitting: Emergency Medicine

## 2012-06-06 DIAGNOSIS — R0602 Shortness of breath: Secondary | ICD-10-CM | POA: Insufficient documentation

## 2012-06-06 DIAGNOSIS — R079 Chest pain, unspecified: Secondary | ICD-10-CM

## 2012-06-06 DIAGNOSIS — R0789 Other chest pain: Secondary | ICD-10-CM | POA: Insufficient documentation

## 2012-06-06 DIAGNOSIS — Z79899 Other long term (current) drug therapy: Secondary | ICD-10-CM | POA: Insufficient documentation

## 2012-06-06 LAB — CBC WITH DIFFERENTIAL/PLATELET
Basophils Absolute: 0 10*3/uL (ref 0.0–0.1)
Eosinophils Relative: 2 % (ref 0–5)
Lymphocytes Relative: 30 % (ref 12–46)
Neutro Abs: 5.4 10*3/uL (ref 1.7–7.7)
Platelets: 176 10*3/uL (ref 150–400)
RDW: 14.4 % (ref 11.5–15.5)
WBC: 9.1 10*3/uL (ref 4.0–10.5)

## 2012-06-06 LAB — BASIC METABOLIC PANEL
Calcium: 9.4 mg/dL (ref 8.4–10.5)
Creatinine, Ser: 0.9 mg/dL (ref 0.50–1.35)
GFR calc Af Amer: >90 mL/min (ref >90)

## 2012-06-06 LAB — TROPONIN I: Troponin I: <0.3 ng/mL (ref <0.30)

## 2012-06-06 MED ORDER — ASPIRIN 81 MG PO CHEW
162.0000 mg | CHEWABLE_TABLET | Freq: Once | ORAL | Status: AC
  Administered 2012-06-06: 162 mg via ORAL
  Filled 2012-06-06: qty 2

## 2012-06-06 NOTE — ED Notes (Signed)
Chest pain.  30 minutes ago while laying down his hands got numb then his chest started to hurt. Dull aching pain. Hx of same when he was diagnosed with pleurisy. In pt at [facility]. Off cocaine x 2 weeks.

## 2012-06-07 MED ORDER — ASPIRIN EC 325 MG PO TBEC: 325.0000 mg | DELAYED_RELEASE_TABLET | Freq: Every day | ORAL | Status: DC

## 2012-06-07 NOTE — ED Provider Notes (Signed)
History     CSN: [id number]  Arrival date & time 06/06/12  2154   First MD Initiated Contact with Patient 06/06/12 2306      Chief Complaint  Patient presents with  . Chest Pain    (Consider location/radiation/quality/duration/timing/severity/associated sxs/prior treatment) HPI Comments: Pt comes in w/ cc of chest pain. Pt has no medical problems, but admits to using cocaine. States that he started having sudden onset chest pain around 9 pm. The pain is sharp, constant, left sided, and his hands got numb. During my evaluation, he had a mild chest pain. No nausea, diaphoresis admits to some dib. Last cocaine use was 2 weeks ago. Pt's other cardiac risk factor is smoking. No family hx of premature CAD. No trauma, no cough, no hx of DVT and PE and no risk factors for the same.  Patient is a 42 y.o. male presenting with chest pain. The history is provided by the patient.  Chest Pain Associated symptoms: shortness of breath   Associated symptoms: no cough, no dizziness, no fever and no headache     History reviewed. No pertinent past medical history.  History reviewed. No pertinent past surgical history.  No family history on file.  History  Substance Use Topics  . Smoking status: Never Smoker   . Smokeless tobacco: Not on file  . Alcohol Use: No      Review of Systems  Constitutional: Negative for fever, chills and activity change.  HENT: Negative for neck pain.   Eyes: Negative for visual disturbance.  Respiratory: Positive for shortness of breath. Negative for cough and chest tightness.   Cardiovascular: Positive for chest pain.  Gastrointestinal: Negative for abdominal distention.  Genitourinary: Negative for dysuria, enuresis and difficulty urinating.  Musculoskeletal: Negative for arthralgias.  Neurological: Negative for dizziness, light-headedness and headaches.  Psychiatric/Behavioral: Negative for confusion.    Allergies  Review of patient's allergies  indicates no known allergies.  Home Medications   Current Outpatient Rx  Name  Route  Sig  Dispense  Refill  . ARIPiprazole (ABILIFY PO)   Oral   Take by mouth.         . HydrOXYzine Pamoate (VISTARIL PO)   Oral   Take by mouth.         . carbamazepine (TEGRETOL) 200 MG tablet   Oral   Take 100 mg by mouth 2 (two) times daily.         . ciprofloxacin (CIPRO) 500 MG tablet   Oral   Take 1 tablet (500 mg total) by mouth 2 (two) times daily. Take for 14 days.   28 tablet   0   . FLUoxetine (PROZAC) 20 MG capsule   Oral   Take 20 mg by mouth daily.         Marland Kitchen ibuprofen (ADVIL,MOTRIN) 600 MG tablet   Oral   Take 1 tablet (600 mg total) by mouth every 6 (six) hours as needed for pain.   20 tablet   0   . ondansetron (ZOFRAN ODT) 8 MG disintegrating tablet   Oral   Take 1 tablet (8 mg total) by mouth every 8 (eight) hours as needed for nausea.   6 tablet   0     BP 137/90  Pulse 88  Temp(Src) 98.5 F (36.9 C) (Oral)  Resp 20  Wt 316 lb (143.337 kg)  SpO2 97%  Physical Exam  Nursing note and vitals reviewed. Constitutional: He is oriented to person, place, and time. He appears well-developed.  HENT:  Head: Normocephalic and atraumatic.  Eyes: Conjunctivae and EOM are normal. Pupils are equal, round, and reactive to light.  Neck: Normal range of motion. Neck supple. No JVD present.  Cardiovascular: Normal rate and regular rhythm.   Pulmonary/Chest: Effort normal and breath sounds normal.  Abdominal: Soft. Bowel sounds are normal. He exhibits no distension. There is no tenderness. There is no rebound and no guarding.  Neurological: He is alert and oriented to person, place, and time.  Skin: Skin is warm.    ED Course  Procedures (including critical care time)  Labs Reviewed  BASIC METABOLIC PANEL - Abnormal; Notable for the following:    Glucose, Bld 119 (*)    All other components within normal limits  CBC WITH DIFFERENTIAL  TROPONIN I  TROPONIN  I   No results found.   No diagnosis found.    MDM   Date: 06/07/2012  Rate: 87  Rhythm: normal sinus rhythm  QRS Axis: normal  Intervals: normal  ST/T Wave abnormalities: normal  Conduction Disutrbances: none  Narrative Interpretation: unremarkable  Differential diagnosis includes: ACS syndrome CHF exacerbation Valvular disorder Myocarditis Pericarditis Pericardial effusion Pneumonia Pleural effusion Pulmonary edema PE Anemia Musculoskeletal pain  Pt comes in with cc of chest pain. Pt has hx of cocaine use, admits to similar pain in the past - likely with cocaine use. Although last use was 2 weeks back ,this could be cocaine related coronary vasospasms. We will get tropx 2, and give ASA. Benzo for pain if needed.  4:23 AM Pt is pain free. Trops x 2 negative. Will d.c.    Derwood Kaplan, MD 06/07/12 [phone]

## 2012-06-07 NOTE — ED Notes (Signed)
Pt provided with ginger ale and crackers

## 2012-06-07 NOTE — ED Notes (Signed)
MD at bedside. 

## 2012-06-23 ENCOUNTER — Emergency Department (HOSPITAL_BASED_OUTPATIENT_CLINIC_OR_DEPARTMENT_OTHER)
Admission: EM | Admit: 2012-06-23 | Discharge: 2012-06-23 | Disposition: A | Discharge status: Home / Self Care | Payer: Self-pay | Attending: Emergency Medicine | Admitting: Emergency Medicine

## 2012-06-23 ENCOUNTER — Emergency Department (HOSPITAL_BASED_OUTPATIENT_CLINIC_OR_DEPARTMENT_OTHER): Payer: Self-pay

## 2012-06-23 ENCOUNTER — Encounter (HOSPITAL_BASED_OUTPATIENT_CLINIC_OR_DEPARTMENT_OTHER): Payer: Self-pay | Admitting: Emergency Medicine

## 2012-06-23 DIAGNOSIS — W219XXA Striking against or struck by unspecified sports equipment, initial encounter: Secondary | ICD-10-CM | POA: Insufficient documentation

## 2012-06-23 DIAGNOSIS — S8990XA Unspecified injury of unspecified lower leg, initial encounter: Secondary | ICD-10-CM | POA: Insufficient documentation

## 2012-06-23 DIAGNOSIS — Z79899 Other long term (current) drug therapy: Secondary | ICD-10-CM | POA: Insufficient documentation

## 2012-06-23 DIAGNOSIS — Y92838 Other recreation area as the place of occurrence of the external cause: Secondary | ICD-10-CM | POA: Insufficient documentation

## 2012-06-23 DIAGNOSIS — Z96659 Presence of unspecified artificial knee joint: Secondary | ICD-10-CM | POA: Insufficient documentation

## 2012-06-23 DIAGNOSIS — Y936A Activity, physical games generally associated with school recess, summer camp and children: Secondary | ICD-10-CM | POA: Insufficient documentation

## 2012-06-23 DIAGNOSIS — Y9239 Other specified sports and athletic area as the place of occurrence of the external cause: Secondary | ICD-10-CM | POA: Insufficient documentation

## 2012-06-23 DIAGNOSIS — S99929A Unspecified injury of unspecified foot, initial encounter: Secondary | ICD-10-CM | POA: Insufficient documentation

## 2012-06-23 MED ORDER — IBUPROFEN 800 MG PO TABS: 800.0000 mg | ORAL_TABLET | Freq: Three times a day (TID) | ORAL | Status: DC

## 2012-06-23 MED ORDER — IBUPROFEN 800 MG PO TABS
800.0000 mg | ORAL_TABLET | Freq: Once | ORAL | Status: AC
  Administered 2012-06-23: 800 mg via ORAL
  Filled 2012-06-23: qty 1

## 2012-06-23 NOTE — ED Notes (Signed)
Pt sts hit L great  toe unto ground while playing kickball, ecchmymosis and pain to toe, limited movement to toe d/t pain.

## 2012-06-23 NOTE — ED Provider Notes (Signed)
History/physical exam/procedure(s) were performed by non-physician practitioner and as supervising physician I was immediately available for consultation/collaboration. I have reviewed all notes and am in agreement with care and plan.   Marcos Ruelas S Addisynn Vassell, MD 06/23/12 2207 

## 2012-06-23 NOTE — ED Provider Notes (Signed)
History     CSN: [id number]  Arrival date & time 06/23/12  1434   None     Chief Complaint  Patient presents with  . Toe Injury    (Consider location/radiation/quality/duration/timing/severity/associated sxs/prior treatment) HPI Comments: Patient is a 42 year old male who presents with left great toe pain that started today prior to arrival. Patient reports playing kickball when he kicked his toe into the ground. He reports sudden onset of throbbing, severe pain that does not radiate. He reports associated bruising and swelling. Walking and movement of the toe makes the pain worse. Nothing makes the pain better.    No past medical history on file.  Past Surgical History  Procedure Laterality Date  . Knee arthroscopy      No family history on file.  History  Substance Use Topics  . Smoking status: Never Smoker   . Smokeless tobacco: Not on file  . Alcohol Use: No      Review of Systems  Musculoskeletal: Positive for joint swelling.  All other systems reviewed and are negative.    Allergies  Review of patient's allergies indicates no known allergies.  Home Medications   Current Outpatient Rx  Name  Route  Sig  Dispense  Refill  . ARIPiprazole (ABILIFY PO)   Oral   Take by mouth.         Marland Kitchen aspirin EC 325 MG tablet   Oral   Take 1 tablet (325 mg total) by mouth daily.   30 tablet   0   . carbamazepine (TEGRETOL) 200 MG tablet   Oral   Take 100 mg by mouth 2 (two) times daily.         . ciprofloxacin (CIPRO) 500 MG tablet   Oral   Take 1 tablet (500 mg total) by mouth 2 (two) times daily. Take for 14 days.   28 tablet   0   . FLUoxetine (PROZAC) 20 MG capsule   Oral   Take 20 mg by mouth daily.         . HydrOXYzine Pamoate (VISTARIL PO)   Oral   Take by mouth.         Marland Kitchen ibuprofen (ADVIL,MOTRIN) 600 MG tablet   Oral   Take 1 tablet (600 mg total) by mouth every 6 (six) hours as needed for pain.   20 tablet   0   . ondansetron  (ZOFRAN ODT) 8 MG disintegrating tablet   Oral   Take 1 tablet (8 mg total) by mouth every 8 (eight) hours as needed for nausea.   6 tablet   0     BP 132/77  Pulse 98  Temp(Src) 98.4 F (36.9 C) (Oral)  Resp 16  Ht 6\' 1"  (1.854 m)  Wt 315 lb (142.883 kg)  BMI 41.57 kg/m2  SpO2 99%  Physical Exam  Nursing note and vitals reviewed. Constitutional: He appears well-developed and well-nourished. No distress.  HENT:  Head: Normocephalic and atraumatic.  Eyes: Conjunctivae are normal.  Neck: Normal range of motion.  Cardiovascular: Normal rate and regular rhythm.  Exam reveals no gallop and no friction rub.   No murmur heard. Pulmonary/Chest: Effort normal and breath sounds normal. He has no wheezes. He has no rales. He exhibits no tenderness.  Abdominal: Soft. There is no tenderness.  Musculoskeletal: Normal range of motion.  Left great toe limited ROM due to pain. No obvious deformity.   Neurological: He is alert.  Speech is goal-oriented. Moves limbs without ataxia.  Skin: Skin is warm and dry.  Erythema and bruising noted to left great toe.   Psychiatric: He has a normal mood and affect. His behavior is normal.    ED Course  Procedures (including critical care time)  Labs Reviewed - No data to display Dg Toe Great Left  06/23/2012   *RADIOLOGY REPORT*  Clinical Data: Great toe injury, pain and bruising  LEFT GREAT TOE  Comparison: None.  Findings: Three views of the left first toe submitted.  No acute fracture or subluxation.  No radiopaque foreign body.  IMPRESSION: No acute fracture or subluxation.   Original Report Authenticated By: Natasha Mead, M.D.     1. Injury of great toe of left foot, left, initial encounter       MDM  2:58 PM Xray left great toe pending.   3:40 PM Xray of left great toe unremarkable for acute changes. Patient will have ibuprofen for pain. No neurovascular compromise. Vitals stable and patient afebrile. Patient instructed to ice and  elevate affected toe.        Emilia Beck, PA-C 06/23/12 1547

## 2013-05-28 ENCOUNTER — Encounter (HOSPITAL_COMMUNITY): Payer: Self-pay | Admitting: Emergency Medicine

## 2013-05-28 ENCOUNTER — Emergency Department (HOSPITAL_COMMUNITY)
Admission: EM | Admit: 2013-05-28 | Discharge: 2013-05-29 | Disposition: A | Discharge status: Home / Self Care | Payer: Self-pay | Attending: Emergency Medicine | Admitting: Emergency Medicine

## 2013-05-28 ENCOUNTER — Emergency Department (HOSPITAL_COMMUNITY): Payer: BC Managed Care – PPO

## 2013-05-28 DIAGNOSIS — R7309 Other abnormal glucose: Secondary | ICD-10-CM | POA: Insufficient documentation

## 2013-05-28 DIAGNOSIS — R21 Rash and other nonspecific skin eruption: Secondary | ICD-10-CM | POA: Insufficient documentation

## 2013-05-28 DIAGNOSIS — R109 Unspecified abdominal pain: Secondary | ICD-10-CM | POA: Insufficient documentation

## 2013-05-28 DIAGNOSIS — R739 Hyperglycemia, unspecified: Secondary | ICD-10-CM

## 2013-05-28 LAB — URINALYSIS, ROUTINE W REFLEX MICROSCOPIC
Bilirubin Urine: NEGATIVE
Glucose, UA: >1000 mg/dL — AB
Ketones, ur: NEGATIVE mg/dL
Leukocytes, UA: NEGATIVE
NITRITE: NEGATIVE
PH: 5.5 (ref 5.0–8.0)
Protein, ur: 30 mg/dL — AB
SPECIFIC GRAVITY, URINE: 1.034 — AB (ref 1.005–1.030)
UROBILINOGEN UA: 0.2 mg/dL (ref 0.0–1.0)

## 2013-05-28 LAB — URINE MICROSCOPIC-ADD ON

## 2013-05-28 MED ORDER — IBUPROFEN 200 MG PO TABS
400.0000 mg | ORAL_TABLET | Freq: Once | ORAL | Status: AC
  Administered 2013-05-28: 400 mg via ORAL
  Filled 2013-05-28: qty 2

## 2013-05-28 NOTE — ED Notes (Signed)
Pt st's he had sudden onset of left flank pain earlier today and continues to have the pain.  Also c/o poison ivy on trunk of body.

## 2013-05-28 NOTE — ED Notes (Signed)
Patient transported to CT 

## 2013-05-28 NOTE — ED Notes (Signed)
Pt states his rash is continuing to itch. Denies any swelling to throat or tongue. 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

## 2013-05-28 NOTE — ED Provider Notes (Signed)
CSN: [id number]     Arrival date & time 05/28/13  1837 History   First MD Initiated Contact with Patient 05/28/13 2248     Chief Complaint  Patient presents with  . Flank Pain     (Consider location/radiation/quality/duration/timing/severity/associated sxs/prior Treatment) HPI Comments: Patient presents with a chief complaint of left flank pain.  He reports that the pain came on suddenly at 3:30 PM this afternoon.  He describes the pain as a sharp, stabbing pain.  Pain does not radiate. He denies any hematuria.  Denies dysuria, increased frequency, or urgency.  Denies fever or chills.  Denies numbness, tingling, bowel or bladder incontinence.  He reports that the pain is worse with standing and also with movement.  He has not taken anything for pain.  He denies any injury.  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  However, he reports that he has not done anything out of the ordinary.  He denies any prior history of Kidney Stones.  The history is provided by the patient.    History reviewed. No pertinent past medical history. Past Surgical History  Procedure Laterality Date  . Knee arthroscopy     No family history on file. History  Substance Use Topics  . Smoking status: Never Smoker   . Smokeless tobacco: Not on file  . Alcohol Use: No    Review of Systems  All other systems reviewed and are negative.     Allergies  Review of patient's allergies indicates no known allergies.  Home Medications   Prior to Admission medications   Not on File   BP 125/61  Pulse 89  Temp(Src) 97.8 F (36.6 C) (Oral)  Resp 18  Ht 6\' 1"  (1.854 m)  Wt 360 lb (163.295 kg)  BMI 47.51 kg/m2  SpO2 96% Physical Exam  Nursing note and vitals reviewed. Constitutional: He appears well-developed and well-nourished.  HENT:  Head: Normocephalic and atraumatic.  Mouth/Throat: Oropharynx is clear and moist.  Neck: Normal range of  motion. Neck supple.  Cardiovascular: Normal rate, regular rhythm and normal heart sounds.   Pulmonary/Chest: Effort normal and breath sounds normal.  Abdominal: Normal appearance and bowel sounds are normal. There is no tenderness. There is CVA tenderness.  Left CVA tenderness  Neurological: He is alert.  Skin: Skin is warm and dry.  Psychiatric: He has a normal mood and affect.    ED Course  Procedures (including critical care time) Labs Review Labs Reviewed  URINALYSIS, ROUTINE W REFLEX MICROSCOPIC - Abnormal; Notable for the following:    Specific Gravity, Urine 1.034 (*)    Glucose, UA >1000 (*)    Hgb urine dipstick TRACE (*)    Protein, ur 30 (*)    All other components within normal limits  URINE MICROSCOPIC-ADD ON  CBC WITH DIFFERENTIAL  BASIC METABOLIC PANEL    Imaging Review No results found.   EKG Interpretation None      MDM   Final diagnoses:  None   Patient presenting with left flank pain.  No known injury.  UA not showing signs of infection.  Labs unremarkable.  Abdominal CT does not show any evidence of kidney stones.  Therefore, suspect that the pain is musculoskeletal.  Patient did not want pain medication or muscle relaxer due to his past history of addiction. Patient stable for discharge.  Return precautions given.    Santiago Glad, PA-C 05/31/13 0002

## 2013-05-29 LAB — CBC WITH DIFFERENTIAL/PLATELET
BASOS ABS: 0 10*3/uL (ref 0.0–0.1)
BASOS PCT: 0 % (ref 0–1)
EOS ABS: 0.3 10*3/uL (ref 0.0–0.7)
EOS PCT: 4 % (ref 0–5)
HCT: 42.4 % (ref 39.0–52.0)
Hemoglobin: 13.9 g/dL (ref 13.0–17.0)
Lymphocytes Relative: 33 % (ref 12–46)
Lymphs Abs: 2.9 10*3/uL (ref 0.7–4.0)
MCH: 29.2 pg (ref 26.0–34.0)
MCHC: 32.8 g/dL (ref 30.0–36.0)
MCV: 89.1 fL (ref 78.0–100.0)
Monocytes Absolute: 0.6 10*3/uL (ref 0.1–1.0)
Monocytes Relative: 7 % (ref 3–12)
Neutro Abs: 4.8 10*3/uL (ref 1.7–7.7)
Neutrophils Relative %: 56 % (ref 43–77)
PLATELETS: 171 10*3/uL (ref 150–400)
RBC: 4.76 MIL/uL (ref 4.22–5.81)
RDW: 14 % (ref 11.5–15.5)
WBC: 8.6 10*3/uL (ref 4.0–10.5)

## 2013-05-29 LAB — BASIC METABOLIC PANEL
BUN: 16 mg/dL (ref 6–23)
CALCIUM: 9.3 mg/dL (ref 8.4–10.5)
CO2: 25 mEq/L (ref 19–32)
Chloride: 105 mEq/L (ref 96–112)
Creatinine, Ser: 0.8 mg/dL (ref 0.50–1.35)
Glucose, Bld: 256 mg/dL — ABNORMAL HIGH (ref 70–99)
Potassium: 3.8 mEq/L (ref 3.7–5.3)
SODIUM: 141 meq/L (ref 137–147)

## 2013-05-29 MED ORDER — METHOCARBAMOL 500 MG PO TABS
500.0000 mg | ORAL_TABLET | Freq: Once | ORAL | Status: DC
  Filled 2013-05-29: qty 1

## 2013-05-29 MED ORDER — KETOROLAC TROMETHAMINE 30 MG/ML IJ SOLN
30.0000 mg | Freq: Once | INTRAMUSCULAR | Status: AC
  Administered 2013-05-29: 30 mg via INTRAMUSCULAR
  Filled 2013-05-29: qty 1

## 2013-05-29 MED ORDER — DEXAMETHASONE SODIUM PHOSPHATE 10 MG/ML IJ SOLN
10.0000 mg | Freq: Once | INTRAMUSCULAR | Status: AC
  Administered 2013-05-29: 10 mg via INTRAMUSCULAR
  Filled 2013-05-29: qty 1

## 2013-05-29 MED ORDER — METFORMIN HCL 500 MG PO TABS
500.0000 mg | ORAL_TABLET | Freq: Two times a day (BID) | ORAL | Status: DC
Start: 2013-05-29 — End: 2014-06-02

## 2013-05-29 MED ORDER — HYDROCORTISONE 2.5 % EX LOTN: TOPICAL_LOTION | Freq: Two times a day (BID) | CUTANEOUS | Status: DC

## 2013-05-31 NOTE — ED Provider Notes (Signed)
Medical screening examination/treatment/procedure(s) were performed by non-physician practitioner and as supervising physician I was immediately available for consultation/collaboration.   Hazelene Doten, MD 05/31/13 0742 

## 2013-07-19 ENCOUNTER — Emergency Department (INDEPENDENT_AMBULATORY_CARE_PROVIDER_SITE_OTHER)
Admission: EM | Admit: 2013-07-19 | Discharge: 2013-07-19 | Disposition: A | Discharge status: Home / Self Care | Payer: Self-pay | Source: Home / Self Care | Attending: Family Medicine | Admitting: Family Medicine

## 2013-07-19 ENCOUNTER — Encounter (HOSPITAL_COMMUNITY): Payer: Self-pay | Admitting: Emergency Medicine

## 2013-07-19 DIAGNOSIS — E860 Dehydration: Secondary | ICD-10-CM

## 2013-07-19 DIAGNOSIS — E1165 Type 2 diabetes mellitus with hyperglycemia: Secondary | ICD-10-CM

## 2013-07-19 DIAGNOSIS — E119 Type 2 diabetes mellitus without complications: Secondary | ICD-10-CM

## 2013-07-19 DIAGNOSIS — J069 Acute upper respiratory infection, unspecified: Secondary | ICD-10-CM

## 2013-07-19 LAB — POCT I-STAT, CHEM 8
BUN: 12 mg/dL (ref 6–23)
CREATININE: 0.7 mg/dL (ref 0.50–1.35)
Calcium, Ion: 1.28 mmol/L — ABNORMAL HIGH (ref 1.12–1.23)
Chloride: 99 mEq/L (ref 96–112)
Glucose, Bld: 134 mg/dL — ABNORMAL HIGH (ref 70–99)
HCT: 53 % — ABNORMAL HIGH (ref 39.0–52.0)
HEMOGLOBIN: 18 g/dL — AB (ref 13.0–17.0)
Potassium: 4.3 mEq/L (ref 3.7–5.3)
SODIUM: 140 meq/L (ref 137–147)
TCO2: 27 mmol/L (ref 0–100)

## 2013-07-19 LAB — POCT RAPID STREP A: STREPTOCOCCUS, GROUP A SCREEN (DIRECT): NEGATIVE

## 2013-07-19 MED ORDER — METFORMIN HCL 500 MG PO TABS: 500.0000 mg | ORAL_TABLET | Freq: Every day | ORAL | Status: DC

## 2013-07-19 NOTE — ED Notes (Signed)
C/o  Productive cough with green mucus off/on.  Mild sob.  Sore throat.  Sinus pressure and pain.  Chills.   Symptoms present 7/6.   No relief with otc meds.

## 2013-07-19 NOTE — ED Provider Notes (Signed)
CSN: [id number]     Arrival date & time 07/19/13  1428 History   First MD Initiated Contact with Patient 07/19/13 1540     Chief Complaint  Patient presents with  . URI  . Medication Refill   (Consider location/radiation/quality/duration/timing/severity/associated sxs/prior Treatment) HPI Comments: Also mentions that he does not have PCP and has run out of Metformin that he takes for T2DM.   Patient is a 43 y.o. male presenting with URI. The history is provided by the patient.  URI Presenting symptoms: congestion, cough, rhinorrhea and sore throat   Presenting symptoms: no ear pain, no facial pain, no fatigue and no fever   Severity:  Moderate Onset quality:  Gradual Duration:  4 days Timing:  Constant Progression:  Unchanged Chronicity:  New Associated symptoms: sinus pain   Associated symptoms: no arthralgias, no headaches, no myalgias, no neck pain, no swollen glands and no wheezing   Risk factors: sick contacts   Risk factors comment:  States girlfriend ill with same last week.    History reviewed. No pertinent past medical history. Past Surgical History  Procedure Laterality Date  . Knee arthroscopy     History reviewed. No pertinent family history. History  Substance Use Topics  . Smoking status: Never Smoker   . Smokeless tobacco: Not on file  . Alcohol Use: No    Review of Systems  Constitutional: Negative for fever and fatigue.  HENT: Positive for congestion, rhinorrhea and sore throat. Negative for ear pain, mouth sores, postnasal drip, trouble swallowing and voice change.   Eyes: Negative.   Respiratory: Positive for cough. Negative for wheezing.   Cardiovascular: Negative.   Gastrointestinal: Negative.   Endocrine: Negative for polydipsia, polyphagia and polyuria.  Genitourinary: Negative.   Musculoskeletal: Negative for arthralgias, back pain, myalgias and neck pain.  Allergic/Immunologic: Negative for immunocompromised state.  Neurological: Negative for  dizziness, weakness and headaches.    Allergies  Review of patient's allergies indicates no known allergies.  Home Medications   Prior to Admission medications   Medication Sig Start Date End Date Taking? Authorizing Provider  metFORMIN (GLUCOPHAGE) 500 MG tablet Take 1 tablet (500 mg total) by mouth 2 (two) times daily with a meal. 05/29/13  Yes Heather Laisure, PA-C  hydrocortisone 2.5 % lotion Apply topically 2 (two) times daily. 05/29/13   Santiago Glad, PA-C  metFORMIN (GLUCOPHAGE) 500 MG tablet Take 1 tablet (500 mg total) by mouth daily with breakfast. 07/19/13   Jess Barters Bedie Dominey, PA   BP 128/73  Pulse 115  Temp(Src) 98.7 F (37.1 C) (Oral)  Resp 18  SpO2 93% Physical Exam  Nursing note and vitals reviewed. Constitutional: He is oriented to person, place, and time. He appears well-developed and well-nourished. No distress.  HENT:  Head: Normocephalic and atraumatic.  Right Ear: Hearing, tympanic membrane, external ear and ear canal normal.  Left Ear: Hearing, tympanic membrane, external ear and ear canal normal.  Nose: Nose normal.  Mouth/Throat: Uvula is midline, oropharynx is clear and moist and mucous membranes are normal.  Eyes: Conjunctivae are normal. No scleral icterus.  Neck: Normal range of motion. Neck supple.  Cardiovascular: Normal rate, regular rhythm and normal heart sounds.   Pulmonary/Chest: Effort normal and breath sounds normal.  Musculoskeletal: Normal range of motion.  Lymphadenopathy:    He has no cervical adenopathy.  Neurological: He is alert and oriented to person, place, and time.  Skin: Skin is warm and dry. No rash noted. No erythema.  Psychiatric: He has a  normal mood and affect. His behavior is normal.    ED Course  Procedures (including critical care time) Labs Review Labs Reviewed  POCT I-STAT, CHEM 8 - Abnormal; Notable for the following:    Glucose, Bld 134 (*)    Calcium, Ion 1.28 (*)    Hemoglobin 18.0 (*)    HCT 53.0 (*)     All other components within normal limits  POCT RAPID STREP A ([facility]ARE ONLY)    Imaging Review No results found.   MDM   1. Dehydration   2. Hyperglycemia due to type 2 diabetes mellitus   3. URI (upper respiratory infection)   Mild hyperglycemia with mild dehydration but no evidence of DKA. Advised to resume taking metformin as prescribed and work towrd locating primary car provider. Increase oral fluid intake at home. Rapid strep negative. Symptomatic care of mild viral URI symptoms at home.    Jess BartersJennifer Lee MuncyPresson, GeorgiaPA 07/19/13 [phone]

## 2013-07-19 NOTE — ED Notes (Signed)
Pt requesting refill on metformin.  States had last dose on 7/1.   Mw,cma

## 2013-07-19 NOTE — ED Provider Notes (Signed)
Medical screening examination/treatment/procedure(s) were performed by resident physician or non-physician practitioner and as supervising physician I was immediately available for consultation/collaboration.   Ivo Moga DOUGLAS MD.   Olympia Adelsberger D Mililani Murthy, MD 07/19/13 2039 

## 2013-07-19 NOTE — Discharge Instructions (Signed)
Antibiotic Nonuse  Your caregiver felt that the infection or problem was not one that would be helped with an antibiotic. Infections may be caused by viruses or bacteria. Only a caregiver can tell which one of these is the likely cause of an illness. A cold is the most common cause of infection in both adults and children. A cold is a virus. Antibiotic treatment will have no effect on a viral infection. Viruses can lead to many lost days of work caring for sick children and many missed days of school. Children may catch as many as 10 "colds" or "flus" per year during which they can be tearful, cranky, and uncomfortable. The goal of treating a virus is aimed at keeping the ill person comfortable. Antibiotics are medications used to help the body fight bacterial infections. There are relatively few types of bacteria that cause infections but there are hundreds of viruses. While both viruses and bacteria cause infection they are very different types of germs. A viral infection will typically go away by itself within 7 to 10 days. Bacterial infections may spread or get worse without antibiotic treatment. Examples of bacterial infections are:  Sore throats (like strep throat or tonsillitis).  Infection in the lung (pneumonia).  Ear and skin infections. Examples of viral infections are:  Colds or flus.  Most coughs and bronchitis.  Sore throats not caused by Strep.  Runny noses. It is often best not to take an antibiotic when a viral infection is the cause of the problem. Antibiotics can kill off the helpful bacteria that we have inside our body and allow harmful bacteria to start growing. Antibiotics can cause side effects such as allergies, nausea, and diarrhea without helping to improve the symptoms of the viral infection. Additionally, repeated uses of antibiotics can cause bacteria inside of our body to become resistant. That resistance can be passed onto harmful bacterial. The next time you have  an infection it may be harder to treat if antibiotics are used when they are not needed. Not treating with antibiotics allows our own immune system to develop and take care of infections more efficiently. Also, antibiotics will work better for [place] when they are prescribed for bacterial infections. Treatments for a child that is ill may include:  Give extra fluids throughout the day to stay hydrated.  Get plenty of rest.  Only give your child over-the-counter or prescription medicines for pain, discomfort, or fever as directed by your caregiver.  The use of a cool mist humidifier may help stuffy noses.  Cold medications if suggested by your caregiver. Your caregiver may decide to start you on an antibiotic if:  The problem you were seen for today continues for a longer length of time than expected.  You develop a secondary bacterial infection. SEEK MEDICAL CARE IF:  Fever lasts longer than 5 days.  Symptoms continue to get worse after 5 to 7 days or become severe.  Difficulty in breathing develops.  Signs of dehydration develop (poor drinking, rare urinating, dark colored urine).  Changes in behavior or worsening tiredness (listlessness or lethargy). Document Released: 03/08/2001 Document Revised: 03/22/2011 Document Reviewed: 09/04/2008 [facility] Patient Information 2015 [place], [place]. This information is not intended to replace advice given to you by your health care provider. Make sure you discuss any questions you have with your health care provider.  Cough, Adult  A cough is a reflex that helps clear your throat and airways. It can help heal the body or may be a reaction  to an irritated airway. A cough may only last 2 or 3 weeks (acute) or may last more than 8 weeks (chronic).  CAUSES Acute cough:  Viral or bacterial infections. Chronic cough:  Infections.  Allergies.  Asthma.  Post-nasal drip.  Smoking.  Heartburn or acid reflux.  Some medicines.  Chronic  lung problems (COPD).  Cancer. SYMPTOMS   Cough.  Fever.  Chest pain.  Increased breathing rate.  High-pitched whistling sound when breathing (wheezing).  Colored mucus that you cough up (sputum). TREATMENT   A bacterial cough may be treated with antibiotic medicine.  A viral cough must run its course and will not respond to antibiotics.  Your caregiver may recommend other treatments if you have a chronic cough. HOME CARE INSTRUCTIONS   Only take over-the-counter or prescription medicines for pain, discomfort, or fever as directed by your caregiver. Use cough suppressants only as directed by your caregiver.  Use a cold steam vaporizer or humidifier in your bedroom or home to help loosen secretions.  Sleep in a semi-upright position if your cough is worse at night.  Rest as needed.  Stop smoking if you smoke. SEEK IMMEDIATE MEDICAL CARE IF:   You have pus in your sputum.  Your cough starts to worsen.  You cannot control your cough with suppressants and are losing sleep.  You begin coughing up blood.  You have difficulty breathing.  You develop pain which is getting worse or is uncontrolled with medicine.  You have a fever. MAKE SURE YOU:   Understand these instructions.  Will watch your condition.  Will get help right away if you are not doing well or get worse. Document Released: 06/26/2010 Document Revised: 03/22/2011 Document Reviewed: 06/26/2010 [facility] Patient Information 2015 [place], [place]. This information is not intended to replace advice given to you by your health care provider. Make sure you discuss any questions you have with your health care provider.  Dehydration, Adult Dehydration is when you lose more fluids from the body than you take in. Vital organs like the kidneys, brain, and heart cannot function without a proper amount of fluids and salt. Any loss of fluids from the body can cause dehydration.  CAUSES    Vomiting.  Diarrhea.  Excessive sweating.  Excessive urine output.  Fever. SYMPTOMS  Mild dehydration  Thirst.  Dry lips.  Slightly dry mouth. Moderate dehydration  Very dry mouth.  Sunken eyes.  Skin does not bounce back quickly when lightly pinched and released.  Dark urine and decreased urine production.  Decreased tear production.  Headache. Severe dehydration  Very dry mouth.  Extreme thirst.  Rapid, weak pulse (more than 100 beats per minute at rest).  Cold hands and feet.  Not able to sweat in spite of heat and temperature.  Rapid breathing.  Blue lips.  Confusion and lethargy.  Difficulty being awakened.  Minimal urine production.  No tears. DIAGNOSIS  Your caregiver will diagnose dehydration based on your symptoms and your exam. Blood and urine tests will help confirm the diagnosis. The diagnostic evaluation should also identify the cause of dehydration. TREATMENT  Treatment of mild or moderate dehydration can often be done at home by increasing the amount of fluids that you drink. It is best to drink small amounts of fluid more often. Drinking too much at one time can make vomiting worse. Refer to the home care instructions below. Severe dehydration needs to be treated at the hospital where you will probably be given intravenous (IV) fluids that contain  water and electrolytes. HOME CARE INSTRUCTIONS   Ask your caregiver about specific rehydration instructions.  Drink enough fluids to keep your urine clear or pale yellow.  Drink small amounts frequently if you have nausea and vomiting.  Eat as you normally do.  Avoid:  Foods or drinks high in sugar.  Carbonated drinks.  Juice.  Extremely hot or cold fluids.  Drinks with caffeine.  Fatty, greasy foods.  Alcohol.  Tobacco.  Overeating.  Gelatin desserts.  Wash your hands well to avoid spreading bacteria and viruses.  Only take over-the-counter or prescription  medicines for pain, discomfort, or fever as directed by your caregiver.  Ask your caregiver if you should continue all prescribed and over-the-counter medicines.  Keep all follow-up appointments with your caregiver. SEEK MEDICAL CARE IF:  You have abdominal pain and it increases or stays in one area (localizes).  You have a rash, stiff neck, or severe headache.  You are irritable, sleepy, or difficult to awaken.  You are weak, dizzy, or extremely thirsty. SEEK IMMEDIATE MEDICAL CARE IF:   You are unable to keep fluids down or you get worse despite treatment.  You have frequent episodes of vomiting or diarrhea.  You have blood or green matter (bile) in your vomit.  You have blood in your stool or your stool looks black and tarry.  You have not urinated in 6 to 8 hours, or you have only urinated a small amount of very dark urine.  You have a fever.  You faint. MAKE SURE YOU:   Understand these instructions.  Will watch your condition.  Will get help right away if you are not doing well or get worse. Document Released: 12/28/2004 Document Revised: 03/22/2011 Document Reviewed: 08/17/2010 [facility] Patient Information 2015 [place], [place]. This information is not intended to replace advice given to you by your health care provider. Make sure you discuss any questions you have with your health care provider.  Upper Respiratory Infection, Adult An upper respiratory infection (URI) is also sometimes known as the common cold. The upper respiratory tract includes the nose, sinuses, throat, trachea, and bronchi. Bronchi are the airways leading to the lungs. Most people improve within 1 week, but symptoms can last up to 2 weeks. A residual cough may last even longer.  CAUSES Many different viruses can infect the tissues lining the upper respiratory tract. The tissues become irritated and inflamed and often become very moist. Mucus production is also common. A cold is contagious. You can  easily spread the virus to others by oral contact. This includes kissing, sharing a glass, coughing, or sneezing. Touching your mouth or nose and then touching a surface, which is then touched by another person, can also spread the virus. SYMPTOMS  Symptoms typically develop 1 to 3 days after you come in contact with a cold virus. Symptoms vary from person to person. They may include:  Runny nose.  Sneezing.  Nasal congestion.  Sinus irritation.  Sore throat.  Loss of voice (laryngitis).  Cough.  Fatigue.  Muscle aches.  Loss of appetite.  Headache.  Low-grade fever. DIAGNOSIS  You might diagnose your own cold based on familiar symptoms, since most people get a cold 2 to 3 times a year. Your caregiver can confirm this based on your exam. Most importantly, your caregiver can check that your symptoms are not due to another disease such as strep throat, sinusitis, pneumonia, asthma, or epiglottitis. Blood tests, throat tests, and X-rays are not necessary to diagnose a common  cold, but they may sometimes be helpful in excluding other more serious diseases. Your caregiver will decide if any further tests are required. RISKS AND COMPLICATIONS  You may be at risk for a more severe case of the common cold if you smoke cigarettes, have chronic heart disease (such as heart failure) or lung disease (such as asthma), or if you have a weakened immune system. The very young and very old are also at risk for more serious infections. Bacterial sinusitis, middle ear infections, and bacterial pneumonia can complicate the common cold. The common cold can worsen asthma and chronic obstructive pulmonary disease (COPD). Sometimes, these complications can require emergency medical care and may be life-threatening. PREVENTION  The best way to protect against getting a cold is to practice good hygiene. Avoid oral or hand contact with people with cold symptoms. Wash your hands often if contact occurs. There is  no clear evidence that vitamin C, vitamin E, echinacea, or exercise reduces the chance of developing a cold. However, it is always recommended to get plenty of rest and practice good nutrition. TREATMENT  Treatment is directed at relieving symptoms. There is no cure. Antibiotics are not effective, because the infection is caused by a virus, not by bacteria. Treatment may include:  Increased fluid intake. Sports drinks offer valuable electrolytes, sugars, and fluids.  Breathing heated mist or steam (vaporizer or shower).  Eating chicken soup or other clear broths, and maintaining good nutrition.  Getting plenty of rest.  Using gargles or lozenges for comfort.  Controlling fevers with ibuprofen or acetaminophen as directed by your caregiver.  Increasing usage of your inhaler if you have asthma. Zinc gel and zinc lozenges, taken in the first 24 hours of the common cold, can shorten the duration and lessen the severity of symptoms. Pain medicines may help with fever, muscle aches, and throat pain. A variety of non-prescription medicines are available to treat congestion and runny nose. Your caregiver can make recommendations and may suggest nasal or lung inhalers for other symptoms.  HOME CARE INSTRUCTIONS   Only take over-the-counter or prescription medicines for pain, discomfort, or fever as directed by your caregiver.  Use a warm mist humidifier or inhale steam from a shower to increase air moisture. This may keep secretions moist and make it easier to breathe.  Drink enough water and fluids to keep your urine clear or pale yellow.  Rest as needed.  Return to work when your temperature has returned to normal or as your caregiver advises. You may need to stay home longer to avoid infecting others. You can also use a face mask and careful hand washing to prevent spread of the virus. SEEK MEDICAL CARE IF:   After the first few days, you feel you are getting worse rather than better.  You  need your caregiver's advice about medicines to control symptoms.  You develop chills, worsening shortness of breath, or brown or red sputum. These may be signs of pneumonia.  You develop yellow or brown nasal discharge or pain in the face, especially when you bend forward. These may be signs of sinusitis.  You develop a fever, swollen neck glands, pain with swallowing, or white areas in the back of your throat. These may be signs of strep throat. SEEK IMMEDIATE MEDICAL CARE IF:   You have a fever.  You develop severe or persistent headache, ear pain, sinus pain, or chest pain.  You develop wheezing, a prolonged cough, cough up blood, or have a change in  your usual mucus (if you have chronic lung disease).  You develop sore muscles or a stiff neck. Document Released: 06/23/2000 Document Revised: 03/22/2011 Document Reviewed: 05/01/2010 [facility] Patient Information 2015 [place], [place]. This information is not intended to replace advice given to you by your health care provider. Make sure you discuss any questions you have with your health care provider.  Rehydration, Adult Rehydration is the replacement of body fluids lost during dehydration. Dehydration is an extreme loss of body fluids to the point of body function impairment. There are many ways extreme fluid loss can occur, including vomiting, diarrhea, or excess sweating. Recovering from dehydration requires replacing lost fluids, continuing to eat to maintain strength, and avoiding foods and beverages that may contribute to further fluid loss or may increase nausea. HOW TO REHYDRATE In most cases, rehydration involves the replacement of not only fluids but also carbohydrates and basic body salts. Rehydration with an oral rehydration solution is one way to replace essential nutrients lost through dehydration. An oral rehydration solution can be purchased at pharmacies, retail stores, and online. Premixed packets of powder that you combine  with water to make a solution are also sold. You can prepare an oral rehydration solution at home by mixing the following ingredients together:    - tsp table salt.   tsp baking soda.   tsp salt substitute containing potassium chloride.  1 tablespoons sugar.  1 L (34 oz) of water. Be sure to use exact measurements. Including too much sugar can make diarrhea worse. Drink -1 cup (120-240 mL) of oral rehydration solution each time you have diarrhea or vomit. If drinking this amount makes your vomiting worse, try drinking smaller amounts more often. For example, drink 1-3 tsp every 5-10 minutes.  A general rule for staying hydrated is to drink 1-2 L of fluid per day. Talk to your caregiver about the specific amount you should be drinking each day. Drink enough fluids to keep your urine clear or pale yellow. EATING WHEN DEHYDRATED Even if you have had severe sweating or you are having diarrhea, do not stop eating. Many healthy items in a normal diet are okay to continue eating while recovering from dehydration. The following tips can help you to lessen nausea when you eat:  Ask someone else to prepare your food. Cooking smells may worsen nausea.  Eat in a well-ventilated room away from cooking smells.  Sit up when you eat. Avoid lying down until 1-2 hours after eating.  Eat small amounts when you eat.  Eat foods that are easy to digest. These include soft, well-cooked, or mashed foods. FOODS AND BEVERAGES TO AVOID Avoid eating or drinking the following foods and beverages that may increase nausea or further loss of fluid:   Fruit juices with a high sugar content, such as concentrated juices.  Alcohol.  Beverages containing caffeine.  Carbonated drinks. They may cause a lot of gas.  Foods that may cause a lot of gas, such as cabbage, broccoli, and beans.  Fatty, greasy, and fried foods.  Spicy, very salty, and very sweet foods or drinks.  Foods or drinks that are very hot or  very cold. Consume food or drinks at or near room temperature.  Foods that need a lot of chewing, such as raw vegetables.  Foods that are sticky or hard to swallow, such as peanut butter. Document Released: 03/22/2011 Document Revised: 09/22/2011 Document Reviewed: 03/22/2011 [facility] Patient Information 2015 [place], [place]. This information is not intended to replace advice given to  you by your health care provider. Make sure you discuss any questions you have with your health care provider.

## 2013-07-21 LAB — CULTURE, GROUP A STREP

## 2014-01-11 DIAGNOSIS — I219 Acute myocardial infarction, unspecified: Secondary | ICD-10-CM

## 2014-01-11 HISTORY — DX: Acute myocardial infarction, unspecified: I21.9

## 2014-05-12 DIAGNOSIS — I639 Cerebral infarction, unspecified: Secondary | ICD-10-CM

## 2014-05-12 HISTORY — DX: Cerebral infarction, unspecified: I63.9

## 2014-05-31 ENCOUNTER — Encounter (HOSPITAL_COMMUNITY): Payer: Self-pay

## 2014-05-31 ENCOUNTER — Inpatient Hospital Stay (HOSPITAL_COMMUNITY)
Admission: EM | Admit: 2014-05-31 | Discharge: 2014-06-02 | DRG: 092 | Disposition: A | Discharge status: Home / Self Care | Payer: Self-pay | Attending: Internal Medicine | Admitting: Internal Medicine

## 2014-05-31 ENCOUNTER — Emergency Department (HOSPITAL_COMMUNITY): Payer: Self-pay

## 2014-05-31 DIAGNOSIS — Z79899 Other long term (current) drug therapy: Secondary | ICD-10-CM

## 2014-05-31 DIAGNOSIS — I8290 Acute embolism and thrombosis of unspecified vein: Secondary | ICD-10-CM | POA: Insufficient documentation

## 2014-05-31 DIAGNOSIS — M542 Cervicalgia: Secondary | ICD-10-CM

## 2014-05-31 DIAGNOSIS — Z6841 Body Mass Index (BMI) 40.0 and over, adult: Secondary | ICD-10-CM

## 2014-05-31 DIAGNOSIS — E119 Type 2 diabetes mellitus without complications: Secondary | ICD-10-CM | POA: Diagnosis present

## 2014-05-31 DIAGNOSIS — Z8673 Personal history of transient ischemic attack (TIA), and cerebral infarction without residual deficits: Secondary | ICD-10-CM

## 2014-05-31 DIAGNOSIS — Z8249 Family history of ischemic heart disease and other diseases of the circulatory system: Secondary | ICD-10-CM

## 2014-05-31 DIAGNOSIS — Z833 Family history of diabetes mellitus: Secondary | ICD-10-CM

## 2014-05-31 DIAGNOSIS — E1141 Type 2 diabetes mellitus with diabetic mononeuropathy: Secondary | ICD-10-CM

## 2014-05-31 DIAGNOSIS — I639 Cerebral infarction, unspecified: Secondary | ICD-10-CM | POA: Diagnosis present

## 2014-05-31 DIAGNOSIS — Z87891 Personal history of nicotine dependence: Secondary | ICD-10-CM

## 2014-05-31 DIAGNOSIS — E1149 Type 2 diabetes mellitus with other diabetic neurological complication: Secondary | ICD-10-CM | POA: Diagnosis present

## 2014-05-31 DIAGNOSIS — E785 Hyperlipidemia, unspecified: Secondary | ICD-10-CM | POA: Diagnosis present

## 2014-05-31 DIAGNOSIS — E669 Obesity, unspecified: Secondary | ICD-10-CM | POA: Insufficient documentation

## 2014-05-31 DIAGNOSIS — G08 Intracranial and intraspinal phlebitis and thrombophlebitis: Secondary | ICD-10-CM

## 2014-05-31 DIAGNOSIS — Z86718 Personal history of other venous thrombosis and embolism: Secondary | ICD-10-CM

## 2014-05-31 DIAGNOSIS — R202 Paresthesia of skin: Secondary | ICD-10-CM

## 2014-05-31 DIAGNOSIS — G4482 Headache associated with sexual activity: Secondary | ICD-10-CM | POA: Insufficient documentation

## 2014-05-31 HISTORY — DX: Intracranial and intraspinal phlebitis and thrombophlebitis: G08

## 2014-05-31 HISTORY — DX: Personal history of transient ischemic attack (TIA), and cerebral infarction without residual deficits: Z86.73

## 2014-05-31 LAB — I-STAT CHEM 8, ED
BUN: 9 mg/dL (ref 6–20)
Calcium, Ion: 1.19 mmol/L (ref 1.12–1.23)
Chloride: 100 mmol/L — ABNORMAL LOW (ref 101–111)
Creatinine, Ser: 1 mg/dL (ref 0.61–1.24)
Glucose, Bld: 205 mg/dL — ABNORMAL HIGH (ref 65–99)
HEMATOCRIT: 54 % — AB (ref 39.0–52.0)
HEMOGLOBIN: 18.4 g/dL — AB (ref 13.0–17.0)
Potassium: 3.9 mmol/L (ref 3.5–5.1)
SODIUM: 138 mmol/L (ref 135–145)
TCO2: 22 mmol/L (ref 0–100)

## 2014-05-31 LAB — APTT: APTT: 80 s — AB (ref 24–37)

## 2014-05-31 LAB — PROTIME-INR
INR: 1.11 (ref 0.00–1.49)
PROTHROMBIN TIME: 14.5 s (ref 11.6–15.2)

## 2014-05-31 MED ORDER — HEPARIN BOLUS VIA INFUSION
4000.0000 [IU] | Freq: Once | INTRAVENOUS | Status: AC
  Administered 2014-05-31: 4000 [IU] via INTRAVENOUS
  Filled 2014-05-31: qty 4000

## 2014-05-31 MED ORDER — IOHEXOL 350 MG/ML SOLN
100.0000 mL | Freq: Once | INTRAVENOUS | Status: AC | PRN
  Administered 2014-05-31: 100 mL via INTRAVENOUS

## 2014-05-31 MED ORDER — HEPARIN (PORCINE) IN NACL 100-0.45 UNIT/ML-% IJ SOLN
2000.0000 [IU]/h | INTRAMUSCULAR | Status: DC
  Administered 2014-05-31: 2000 [IU]/h via INTRAVENOUS
  Filled 2014-05-31: qty 250

## 2014-05-31 MED ORDER — IOHEXOL 350 MG/ML SOLN: 100.0000 mL | Freq: Once | INTRAVENOUS | Status: AC | PRN

## 2014-05-31 NOTE — H&P (Addendum)
PCP: none   Referring provider MErcedez   Chief Complaint:  headache  HPI: [name] Holland is a 44 y.o. male   has a past medical history of Diabetes mellitus without complication.   At 4 PM he exerted himself felt a sharp pop in his left side of the neck and severe pain followed by tingling in the left side of face. Deneis any weakness. CT was unremarkable but CTA showed Filling defect left transverse sinus compatible with thrombus which appears acute.   Hospitalist was called for admission for CVA  Review of Systems:    Pertinent positives include:  Facial tingling  Constitutional:  No weight loss, night sweats, Fevers, chills, fatigue, weight loss  HEENT:  No headaches, Difficulty swallowing,Tooth/dental problems,Sore throat,  No sneezing, itching, ear ache, nasal congestion, post nasal drip,  Cardio-vascular:  No chest pain, Orthopnea, PND, anasarca, dizziness, palpitations.no Bilateral lower extremity swelling  GI:  No heartburn, indigestion, abdominal pain, nausea, vomiting, diarrhea, change in bowel habits, loss of appetite, melena, blood in stool, hematemesis Resp:  no shortness of breath at rest. No dyspnea on exertion, No excess mucus, no productive cough, No non-productive cough, No coughing up of blood.No change in color of mucus.No wheezing. Skin:  no rash or lesions. No jaundice GU:  no dysuria, change in color of urine, no urgency or frequency. No straining to urinate.  No flank pain.  Musculoskeletal:  No joint pain or no joint swelling. No decreased range of motion. No back pain.  Psych:  No change in mood or affect. No depression or anxiety. No memory loss.  Neuro: no localizing neurological complaints, no tingling, no weakness, no double vision, no gait abnormality, no slurred speech, no confusion  Otherwise ROS are negative except for above, 10 systems were reviewed  Past Medical History: Past Medical History  Diagnosis Date  . Diabetes  mellitus without complication    Past Surgical History  Procedure Laterality Date  . Knee arthroscopy       Medications: Prior to Admission medications   Medication Sig Start Date End Date Taking? Authorizing Provider  hydrocortisone 2.5 % lotion Apply topically 2 (two) times daily. Patient not taking: Reported on 05/31/2014 05/29/13   Santiago Glad, PA-C  metFORMIN (GLUCOPHAGE) 500 MG tablet Take 1 tablet (500 mg total) by mouth 2 (two) times daily with a meal. Patient not taking: Reported on 05/31/2014 05/29/13   Santiago Glad, PA-C  metFORMIN (GLUCOPHAGE) 500 MG tablet Take 1 tablet (500 mg total) by mouth daily with breakfast. 07/19/13   Ria Clock, PA    Allergies:  No Known Allergies  Social History:  Ambulatory  Independently  Lives at home   With family     reports that he has quit smoking. He does not have any smokeless tobacco history on file. He reports that he does not drink alcohol or use illicit drugs.    Family History: family history includes CAD in his father; Diabetes type II in his mother; Other in his mother.    Physical Exam: Patient Vitals for the past 24 hrs:  BP Temp Temp src Pulse Resp SpO2 Height Weight  05/31/14 2134 - - - - - - 6\' 1"  (1.854 m) (!) 161.027 kg (355 lb)  05/31/14 1807 107/57 mmHg - - (!) 52 16 94 % - -  05/31/14 1715 137/84 mmHg 98.4 F (36.9 C) Oral 107 18 96 % - -    1. General:  in No Acute distress 2.  Psychological: Alert and   Oriented 3. Head/ENT:   Moist  Mucous Membranes                          Head Non traumatic, neck supple                          Normal  Dentition 4. SKIN: normal  Skin turgor,  Skin clean Dry and intact no rash 5. Heart: Regular rate and rhythm no Murmur, Rub or gallop 6. Lungs: Clear to auscultation bilaterally, no wheezes or crackles   7. Abdomen: Soft, non-tender, Non distended 8. Lower extremities: no clubbing, cyanosis, or edema 9. Neurologically Grossly intact, moving all 4  extremities equally 10. MSK: Normal range of motion  body mass index is 46.85 kg/(m^2).   Labs on Admission:   Results for orders placed or performed during the hospital encounter of 05/31/14 (from the past 24 hour(s))  I-stat chem 8, ed     Status: Abnormal   Collection Time: 05/31/14  6:05 PM  Result Value Ref Range   Sodium 138 135 - 145 mmol/L   Potassium 3.9 3.5 - 5.1 mmol/L   Chloride 100 (L) 101 - 111 mmol/L   BUN 9 6 - 20 mg/dL   Creatinine, Ser 1.61 0.61 - 1.24 mg/dL   Glucose, Bld 096 (H) 65 - 99 mg/dL   Calcium, Ion 0.45 4.09 - 1.23 mmol/L   TCO2 22 0 - 100 mmol/L   Hemoglobin 18.4 (H) 13.0 - 17.0 g/dL   HCT 81.1 (H) 91.4 - 78.2 %  Protime-INR     Status: None   Collection Time: 05/31/14 10:06 PM  Result Value Ref Range   Prothrombin Time 14.5 11.6 - 15.2 seconds   INR 1.11 0.00 - 1.49  APTT     Status: Abnormal   Collection Time: 05/31/14 10:06 PM  Result Value Ref Range   aPTT 80 (H) 24 - 37 seconds    UA no evidence of UTI  Lab Results  Component Value Date   HGBA1C  11/09/2007    6.0 (NOTE)   The ADA recommends the following therapeutic goal for glycemic   control related to Hgb A1C measurement:   Goal of Therapy:   < 7.0% Hgb A1C   Reference: American Diabetes Association: Clinical Practice   Recommendations 2008, Diabetes Care,  2008, 31:(Suppl 1).    Estimated Creatinine Clearance: 151.3 mL/min (by C-G formula based on Cr of 1).  BNP (last 3 results) No results for input(s): PROBNP in the last 8760 hours.  Other results:  I have pearsonaly reviewed this: ECG REPORT  Rate: 87  Rhythm: Sinus rhythm  ST&T Change:  Inverted T waves in multiple leads   Filed Weights   05/31/14 2134  Weight: 161.027 kg (355 lb)     Cultures:    Component Value Date/Time   SDES THROAT 07/19/2013 1549   SPECREQUEST NONE 07/19/2013 1549   CULT  07/19/2013 1549    No Beta Hemolytic Streptococci Isolated Performed at [facility]   REPTSTATUS  07/21/2013 FINAL 07/19/2013 1549     Radiological Exams on Admission: Ct Angio Head W/cm &/or Wo Cm  05/31/2014   CLINICAL DATA:  Headache.  Posterior neck pain.  Diabetes.  EXAM: CT ANGIOGRAPHY HEAD AND NECK  TECHNIQUE: Multidetector CT imaging of the head and neck was performed using the standard protocol during bolus administration of intravenous contrast.  Multiplanar CT image reconstructions and MIPs were obtained to evaluate the vascular anatomy. Carotid stenosis measurements (when applicable) are obtained utilizing NASCET criteria, using the distal internal carotid diameter as the denominator.  CONTRAST:  OMNIPAQUE IOHEXOL 350 MG/ML SOLN  COMPARISON:  None.  FINDINGS: CT HEAD  Brain: Ventricle size normal. Negative for acute or chronic infarction. Negative for hemorrhage or mass lesion. Normal appearance of the brain.  Calvarium and skull base: Negative  Paranasal sinuses: Negative  Orbits: Negative  Image quality degraded by morbid obesity specially in the neck  CTA NECK  Aortic arch: Limited evaluation of the aortic arch due to patient size. Only the top of the arch was scanned.  Right carotid system: Negative  Left carotid system: Negative  Vertebral arteries:Left vertebral artery dominant. Small right vertebral artery difficult to evaluate due to small size but no evidence of vertebral artery dissection or stenosis bilaterally.  Skeleton: Negative  Other neck: Negative  CTA HEAD  Anterior circulation: Cavernous carotid widely patent bilaterally. Anterior and middle cerebral arteries normal.  Posterior circulation: Left vertebral artery dominant. Both vertebral arteries contribute to the basilar. PICA patent bilaterally. Basilar widely patent. Superior cerebellar and posterior cerebral arteries patent bilaterally.  Venous sinuses: Filling defect in the left transverse sinus with irregular margins. This measures approximately 2.5 cm in length and appears be within the sinus. This sinus remains  patent. This is most likely thrombus. The patient has left posterior neck pain. Right transverse sinus patent. Sagittal sinus patent.  Anatomic variants: Negative for aneurysm  Delayed phase: Normal enhancement  IMPRESSION: Carotid and vertebral arteries are patent bilaterally. 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  Critical Value/emergent results were called by telephone at the time of interpretation on 05/31/2014 at 7:18 pm to [facility] CAMPRUBI-SOMS PA , who verbally acknowledged these results.   Electronically Signed   By: Marlan Palau M.D.   On: 05/31/2014 19:20   Ct Angio Neck W/cm &/or Wo/cm  05/31/2014   CLINICAL DATA:  Headache.  Posterior neck pain.  Diabetes.  EXAM: CT ANGIOGRAPHY HEAD AND NECK  TECHNIQUE: Multidetector CT imaging of the head and neck was performed using the standard protocol during bolus administration of intravenous contrast. Multiplanar CT image reconstructions and MIPs were obtained to evaluate the vascular anatomy. Carotid stenosis measurements (when applicable) are obtained utilizing NASCET criteria, using the distal internal carotid diameter as the denominator.  CONTRAST:  OMNIPAQUE IOHEXOL 350 MG/ML SOLN  COMPARISON:  None.  FINDINGS: CT HEAD  Brain: Ventricle size normal. Negative for acute or chronic infarction. Negative for hemorrhage or mass lesion. Normal appearance of the brain.  Calvarium and skull base: Negative  Paranasal sinuses: Negative  Orbits: Negative  Image quality degraded by morbid obesity specially in the neck  CTA NECK  Aortic arch: Limited evaluation of the aortic arch due to patient size. Only the top of the arch was scanned.  Right carotid system: Negative  Left carotid system: Negative  Vertebral arteries:Left vertebral artery dominant. Small right vertebral artery difficult to evaluate due to small size but no  evidence of vertebral artery dissection or stenosis bilaterally.  Skeleton: Negative  Other neck: Negative  CTA HEAD  Anterior circulation: Cavernous carotid widely patent bilaterally. Anterior and middle cerebral arteries normal.  Posterior circulation: Left vertebral artery dominant. Both vertebral arteries contribute to the basilar. PICA patent bilaterally. Basilar widely  patent. Superior cerebellar and posterior cerebral arteries patent bilaterally.  Venous sinuses: Filling defect in the left transverse sinus with irregular margins. This measures approximately 2.5 cm in length and appears be within the sinus. This sinus remains patent. This is most likely thrombus. The patient has left posterior neck pain. Right transverse sinus patent. Sagittal sinus patent.  Anatomic variants: Negative for aneurysm  Delayed phase: Normal enhancement  IMPRESSION: Carotid and vertebral arteries are patent bilaterally. 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  Critical Value/emergent results were called by telephone at the time of interpretation on 05/31/2014 at 7:18 pm to [facility] CAMPRUBI-SOMS PA , who verbally acknowledged these results.   Electronically Signed   By: Marlan Palau M.D.   On: 05/31/2014 19:20    Chart has been reviewed  Family  at  Bedside  plan of care was discussed with  Son and fianc [phone]  Assessment/Plan  44 year old gentleman with history of diabetes, presented with sudden onset of neck pain and tingling in his left face after strenuous activity. In emergency department CT angiogram of the head and neck showed intracranial sinus thrombosis emergency department has contacted Dr. Roseanne Reno who recommended transfer to [facility] and heparin drip  Present on Admission:  . intracranial sinus thrombosis  -  as per neurology will continue heparin drip  already  started by emergency department. Monitor neurologically. Obtain MRI MRA and MRV. Transit patient to [facility] and have neurology consult. Father is stratify. Obtaining hypercoagulable panel. Obtain echogram O heart and carotid Dopplers . DM type 2 causing neurological disease, not at goal Holding metformin given recent contrast studies.  sensitive sliding scale    Prophylaxis: SCD   CODE STATUS:  FULL CODE  as per patient    Disposition: To home once workup is complete and patient is stable  Other plan as per orders.  I have spent a total of 75 min on this admission extract and was taken to discuss case with neurology as well as pharmacy and to transfer patient to [facility]   [facility] 05/31/2014, 11:07 PM  [facility]  Pager [phone]   after 2 AM please page floor coverage PA If 7AM-7PM, please contact the day team taking care of the patient  [email or web address]  Password [email or web address]

## 2014-05-31 NOTE — ED Notes (Signed)
Pt reports headache, posterior neck pain, and L side facial tingling after "doing some excursion at work and feeling pop" just below L ear around 1630.  Pain score 7/10.  Pt reports similar, less severe pain over the week following "excursion."  Pt clarified that the "excursion" was strenuous work.  Pt works in [employer], [employer].  Facial symmetry noted.  Denies weakness.

## 2014-05-31 NOTE — ED Notes (Signed)
EKG given to Hospitalist,Doutova,MD., for review.

## 2014-05-31 NOTE — ED Notes (Signed)
Patient transported to CT 
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
## 2014-05-31 NOTE — Progress Notes (Signed)
ANTICOAGULATION CONSULT NOTE - Initial Consult  Pharmacy Consult for Heparin IV Indication: L transverse sinus thrombus  No Known Allergies  Patient Measurements: Height: 6\' 1"  (185.4 cm) Weight: (!) 355 lb (161.027 kg) IBW/kg (Calculated) : 79.9 Heparin Dosing Weight: 118  Vital Signs: Temp: 98.4 F (36.9 C) (05/20 1715) Temp Source: Oral (05/20 1715) BP: 107/57 mmHg (05/20 1807) Pulse Rate: 52 (05/20 1807)  Labs:  Recent Labs  05/31/14 1805  HGB 18.4*  HCT 54.0*  CREATININE 1.00    Estimated Creatinine Clearance: 151.3 mL/min (by C-G formula based on Cr of 1).   Medical History: Past Medical History  Diagnosis Date  . Diabetes mellitus without complication     Medications:  Scheduled:  . heparin  4,000 Units Intravenous Once   PRN: iohexol  Assessment: 44yoM with PMH DMT2 presenting with L sided neck pain, found on CTa to have L transverse sinus thrombus.  To start heparin IV per pharmacy with plans to transfer to [facility] for further neurological evaluation.   Baseline INR, aPTT: in process  Prior anticoagulation: none  Today, 05/31/2014:  CBC: H/H wnl; Plt not ordered and no recent values  CrCl > 90 ml/min CG/N  Goal of Therapy: Heparin level 0.3-0.7 units/ml Monitor platelets by anticoagulation protocol: Yes  Plan:  Heparin 4000 units IV bolus x 1  Heparin 2000 units/hr IV infusion  Check heparin level 6 hrs after start  Daily CBC, daily heparin level once stable  Monitor for signs of bleeding or thrombosis   Bernadene Person, PharmD Pager: [phone] 05/31/2014, 9:55 PM

## 2014-06-01 ENCOUNTER — Inpatient Hospital Stay (HOSPITAL_COMMUNITY): Payer: Self-pay

## 2014-06-01 DIAGNOSIS — G459 Transient cerebral ischemic attack, unspecified: Secondary | ICD-10-CM

## 2014-06-01 DIAGNOSIS — I6789 Other cerebrovascular disease: Secondary | ICD-10-CM

## 2014-06-01 DIAGNOSIS — E669 Obesity, unspecified: Secondary | ICD-10-CM | POA: Insufficient documentation

## 2014-06-01 DIAGNOSIS — G4482 Headache associated with sexual activity: Secondary | ICD-10-CM | POA: Insufficient documentation

## 2014-06-01 LAB — LIPID PANEL
CHOL/HDL RATIO: 10.3 ratio
Cholesterol: 227 mg/dL — ABNORMAL HIGH (ref 0–200)
HDL: 22 mg/dL — ABNORMAL LOW (ref >40)
LDL CALC: 150 mg/dL — AB (ref 0–99)
Triglycerides: 277 mg/dL — ABNORMAL HIGH (ref <150)
VLDL: 55 mg/dL — ABNORMAL HIGH (ref 0–40)

## 2014-06-01 LAB — BETA-2-GLYCOPROTEIN I ABS, IGG/M/A: Beta-2 Glyco I IgG: UNDETERMINED GPI IgG units

## 2014-06-01 LAB — CBC
HCT: 48.9 % (ref 39.0–52.0)
HEMOGLOBIN: 16.1 g/dL (ref 13.0–17.0)
MCH: 28.4 pg (ref 26.0–34.0)
MCHC: 32.9 g/dL (ref 30.0–36.0)
MCV: 86.4 fL (ref 78.0–100.0)
Platelets: 179 10*3/uL (ref 150–400)
RBC: 5.66 MIL/uL (ref 4.22–5.81)
RDW: 15.1 % (ref 11.5–15.5)
WBC: 9.5 10*3/uL (ref 4.0–10.5)

## 2014-06-01 LAB — HEPARIN LEVEL (UNFRACTIONATED)
Heparin Unfractionated: <0.1 IU/mL — ABNORMAL LOW (ref 0.30–0.70)
Heparin Unfractionated: 0.16 IU/mL — ABNORMAL LOW (ref 0.30–0.70)
Heparin Unfractionated: 0.42 IU/mL (ref 0.30–0.70)

## 2014-06-01 LAB — RAPID URINE DRUG SCREEN, HOSP PERFORMED
Amphetamines: NOT DETECTED
BARBITURATES: NOT DETECTED
Benzodiazepines: NOT DETECTED
COCAINE: NOT DETECTED
OPIATES: NOT DETECTED
TETRAHYDROCANNABINOL: NOT DETECTED

## 2014-06-01 LAB — GLUCOSE, CAPILLARY
GLUCOSE-CAPILLARY: 178 mg/dL — AB (ref 65–99)
Glucose-Capillary: 139 mg/dL — ABNORMAL HIGH (ref 65–99)
Glucose-Capillary: 199 mg/dL — ABNORMAL HIGH (ref 65–99)
Glucose-Capillary: 188 mg/dL — ABNORMAL HIGH (ref 65–99)
Glucose-Capillary: 165 mg/dL — ABNORMAL HIGH (ref 65–99)
Glucose-Capillary: 208 mg/dL — ABNORMAL HIGH (ref 65–99)

## 2014-06-01 LAB — URINALYSIS, ROUTINE W REFLEX MICROSCOPIC
Bilirubin Urine: NEGATIVE
Glucose, UA: 100 mg/dL — AB
KETONES UR: NEGATIVE mg/dL
Leukocytes, UA: NEGATIVE
Nitrite: NEGATIVE
Protein, ur: 100 mg/dL — AB
Specific Gravity, Urine: >1.046 — ABNORMAL HIGH (ref 1.005–1.030)
UROBILINOGEN UA: 0.2 mg/dL (ref 0.0–1.0)
pH: 5.5 (ref 5.0–8.0)

## 2014-06-01 LAB — TROPONIN I
Troponin I: <0.03 ng/mL (ref <0.031)
Troponin I: 0.04 ng/mL — ABNORMAL HIGH (ref <0.031)

## 2014-06-01 LAB — URINE MICROSCOPIC-ADD ON

## 2014-06-01 LAB — CARDIOLIPIN ANTIBODIES, IGG, IGM, IGA: ANTICARDIOLIPIN IGG: UNDETERMINED GPL U/mL

## 2014-06-01 LAB — ANTITHROMBIN III: AntiThromb III Func: 87 % (ref 75–120)

## 2014-06-01 MED ORDER — PNEUMOCOCCAL VAC POLYVALENT 25 MCG/0.5ML IJ INJ
0.5000 mL | INJECTION | INTRAMUSCULAR | Status: AC
  Administered 2014-06-02: 0.5 mL via INTRAMUSCULAR
  Filled 2014-06-01: qty 0.5

## 2014-06-01 MED ORDER — ACETAMINOPHEN 325 MG PO TABS: 650.0000 mg | ORAL_TABLET | ORAL | Status: DC | PRN

## 2014-06-01 MED ORDER — INSULIN ASPART 100 UNIT/ML ~~LOC~~ SOLN
0.0000 [IU] | SUBCUTANEOUS | Status: DC
  Administered 2014-06-01: 2 [IU] via SUBCUTANEOUS
  Administered 2014-06-01 (×2): 3 [IU] via SUBCUTANEOUS

## 2014-06-01 MED ORDER — INSULIN ASPART 100 UNIT/ML ~~LOC~~ SOLN
0.0000 [IU] | Freq: Every day | SUBCUTANEOUS | Status: DC
  Administered 2014-06-01: 2 [IU] via SUBCUTANEOUS

## 2014-06-01 MED ORDER — HEPARIN (PORCINE) IN NACL 100-0.45 UNIT/ML-% IJ SOLN
2550.0000 [IU]/h | INTRAMUSCULAR | Status: DC
  Administered 2014-06-01: 2550 [IU]/h via INTRAVENOUS
  Administered 2014-06-01: 2300 [IU]/h via INTRAVENOUS
  Filled 2014-06-01 (×3): qty 250

## 2014-06-01 MED ORDER — SENNOSIDES-DOCUSATE SODIUM 8.6-50 MG PO TABS: 1.0000 | ORAL_TABLET | Freq: Every evening | ORAL | Status: DC | PRN

## 2014-06-01 MED ORDER — HEPARIN BOLUS VIA INFUSION
3500.0000 [IU] | INTRAVENOUS | Status: AC
  Administered 2014-06-01: 3500 [IU] via INTRAVENOUS
  Filled 2014-06-01: qty 3500

## 2014-06-01 MED ORDER — ACETAMINOPHEN 650 MG RE SUPP: 650.0000 mg | RECTAL | Status: DC | PRN

## 2014-06-01 MED ORDER — STROKE: EARLY STAGES OF RECOVERY BOOK
Freq: Once | Status: DC
  Filled 2014-06-01: qty 1

## 2014-06-01 MED ORDER — ATORVASTATIN CALCIUM 40 MG PO TABS
40.0000 mg | ORAL_TABLET | Freq: Every day | ORAL | Status: DC
  Administered 2014-06-01: 40 mg via ORAL
  Filled 2014-06-01: qty 1

## 2014-06-01 MED ORDER — EZETIMIBE 10 MG PO TABS
10.0000 mg | ORAL_TABLET | Freq: Every day | ORAL | Status: DC
  Administered 2014-06-01 – 2014-06-02 (×2): 10 mg via ORAL
  Filled 2014-06-01 (×2): qty 1

## 2014-06-01 MED ORDER — SODIUM CHLORIDE 0.9 % IV SOLN
INTRAVENOUS | Status: DC
  Administered 2014-06-01: 02:00:00 via INTRAVENOUS

## 2014-06-01 MED ORDER — INSULIN ASPART 100 UNIT/ML ~~LOC~~ SOLN
0.0000 [IU] | Freq: Three times a day (TID) | SUBCUTANEOUS | Status: DC
  Administered 2014-06-01 (×2): 3 [IU] via SUBCUTANEOUS
  Administered 2014-06-02 (×2): 2 [IU] via SUBCUTANEOUS

## 2014-06-01 NOTE — Progress Notes (Signed)
STROKE TEAM PROGRESS NOTE   HISTORY [name] Holland is an 44 y.o. male history diabetes mellitus and obesity presenting with acute onset of numbness and tingling involving the left side of his neck and face. Onset of symptoms was associated with exertion. He had no visual changes and no changes in speech. He also had no focal motor or sensory changes involving extremities. CT angiogram of head and neck showed findings consistent with a lateral sinus thrombus which appeared to be acute. Findings were also seen on MRV of the head. MRI showed no acute stroke. Patient was started on IV heparin drip and a coagulation. NIH stroke score was 0.  LSN: [id number] on 05/31/2014 tPA Given: No: Minimal subjective sensory symptoms only. mRankin:   SUBJECTIVE (INTERVAL HISTORY) No family members present. Later a family member arrived. The patient states he had a bad headache at left side occipital region during sex. He had previous episode last Saturday during sex too. Consistent with coital cephalgia. However, his MRV and CTV do concern for acute left transverse sinus thrombosis. Currently on intravenous heparin. Symptoms resolved.  OBJECTIVE Temp:  [97.9 F (36.6 C)-99 F (37.2 C)] 97.9 F (36.6 C) (05/21 0800) Pulse Rate:  [52-107] 100 (05/21 0800) Cardiac Rhythm:  [-] Normal sinus rhythm;Sinus tachycardia (05/21 0800) Resp:  [16-23] 18 (05/21 0800) BP: (107-146)/(56-84) 121/56 mmHg (05/21 0800) SpO2:  [93 %-97 %] 97 % (05/21 0800) Weight:  [159.984 kg (352 lb 11.2 oz)-161.027 kg (355 lb)] 159.984 kg (352 lb 11.2 oz) (05/21 0200)   Recent Labs Lab 06/01/14 0159 06/01/14 0514 06/01/14 0727  GLUCAP 139* 199* 188*    Recent Labs Lab 05/31/14 1805  NA 138  K 3.9  CL 100*  GLUCOSE 205*  BUN 9  CREATININE 1.00   No results for input(s): AST, ALT, ALKPHOS, BILITOT, PROT, ALBUMIN in the last 168 hours.  Recent Labs Lab 05/31/14 1805 06/01/14 0754  WBC  --  9.5  HGB 18.4* 16.1  HCT 54.0*  48.9  MCV  --  86.4  PLT  --  179   No results for input(s): CKTOTAL, CKMB, CKMBINDEX, TROPONINI in the last 168 hours.  Recent Labs  05/31/14 2206  LABPROT 14.5  INR 1.11    Recent Labs  05/31/14 2331  COLORURINE YELLOW  LABSPEC >1.046*  PHURINE 5.5  GLUCOSEU 100*  HGBUR TRACE*  BILIRUBINUR NEGATIVE  KETONESUR NEGATIVE  PROTEINUR 100*  UROBILINOGEN 0.2  NITRITE NEGATIVE  LEUKOCYTESUR NEGATIVE       Component Value Date/Time   CHOL 227* 06/01/2014 0448   TRIG 277* 06/01/2014 0448   HDL 22* 06/01/2014 0448   CHOLHDL 10.3 06/01/2014 0448   VLDL 55* 06/01/2014 0448   LDLCALC 150* 06/01/2014 0448   Lab Results  Component Value Date   HGBA1C  11/09/2007    6.0 (NOTE)   The ADA recommends the following therapeutic goal for glycemic   control related to Hgb A1C measurement:   Goal of Therapy:   < 7.0% Hgb A1C   Reference: American Diabetes Association: Clinical Practice   Recommendations 2008, Diabetes Care,  2008, 31:(Suppl 1).      Component Value Date/Time   LABOPIA NONE DETECTED 05/31/2014 2331   COCAINSCRNUR NONE DETECTED 05/31/2014 2331   LABBENZ NONE DETECTED 05/31/2014 2331   AMPHETMU NONE DETECTED 05/31/2014 2331   THCU NONE DETECTED 05/31/2014 2331   LABBARB NONE DETECTED 05/31/2014 2331    No results for input(s): ETH in the last 168 hours.  Ct  Angio Head and Neck W/cm &/or Wo Cm 05/31/2014    Carotid and vertebral arteries are patent bilaterally. 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    Dg Chest 2 View 06/01/2014    Hypoventilatory chest without definite acute process.     Mr MRV MRA Brain Wo Contrast 06/01/2014    Habitus and motion degraded examination.  Normal noncontrast MRI of the brain with aforementioned limitations.  Motion degraded MRA without large vessel occlusion.  Tubular nonocclusive filling defect LEFT  transverse sinus concerning for acute thrombosis as seen on prior CT of the head.     2-D echocardiogram 06/01/2014 Study Conclusions - Procedure narrative: Transthoracic echocardiography. Image quality was adequate. The study was technically difficult, as a result of body habitus. - Left ventricle: The cavity size was normal. There was mild concentric hypertrophy. Systolic function was normal. The estimated ejection fraction was in the range of 55% to 60%. Wall motion was normal; there were no regional wall motion abnormalities. Diastolic dysfunction present, indeterminate grade. - Aortic valve: Mildly calcified annulus. - Mitral valve: Mildly calcified annulus.  CUS - Bilateral: 1-39% ICA stenosis. Vertebral artery flow is antegrade.   PHYSICAL EXAM  Temp:  [97.9 F (36.6 C)-99 F (37.2 C)] 98.6 F (37 C) (05/21 1600) Pulse Rate:  [52-107] 74 (05/21 1600) Resp:  [16-23] 18 (05/21 1600) BP: (107-146)/(54-84) 115/64 mmHg (05/21 1600) SpO2:  [93 %-98 %] 95 % (05/21 1600) Weight:  [352 lb 11.2 oz (159.984 kg)-355 lb (161.027 kg)] 352 lb 11.2 oz (159.984 kg) (05/21 0200)  General - morbid obesity, well developed, in no apparent distress.  Cardiovascular - Regular rate and rhythm with no murmur.  Neck - supple, no bruits  Mental Status -  Level of arousal and orientation to time, place, and person were intact. Language including expression, naming, repetition, comprehension was assessed and found intact Fund of Knowledge was assessed and was intact.  Cranial Nerves II - XII - II - Visual field intact OU. III, IV, VI - Extraocular movements intact. V - Facial sensation intact bilaterally. VII - Facial movement intact bilaterally. VIII - Hearing & vestibular intact bilaterally. X - Palate elevates symmetrically. XI - Chin turning & shoulder shrug intact bilaterally. XII - Tongue protrusion intact.  Motor Strength - The patient's strength was normal in all  extremities and pronator drift was absent.  Bulk was normal and fasciculations were absent.   Motor Tone - Muscle tone was assessed at the neck and appendages and was normal.  Reflexes - The patient's reflexes were 1+ in all extremities and he had no pathological reflexes.  Sensory - Light touch, temperature/pinprick, vibration and proprioception, and Romberg testing were assessed and were symmetrical.    Coordination - The patient had normal movements in the hands and feet with no ataxia or dysmetria.  Tremor was absent.  Gait and Station - The patient's transfers, posture, gait, station, and turns were observed as normal.   ASSESSMENT/Holland [name] Holland is a 44 y.o. male with history of  diabetes mellitus presenting with pain and numbness involving the left side of the face on the left neck. He did not receive IV t-PA due to minimal deficits.  Left transverse sinus compatible with thrombus which appears acute.  May be incidental finding as his symptoms more consistent with coital cephalgia.   MRI  No acute infarct  MRA  Unremarkable  CTV and MRV - concerning for acute transverse venous thrombosis, but chronic thrombosis not able to exclude. Also atypical for subarachnoid granulation.   Carotid Doppler unremarkable  2D Echo unremarkable  LDL 150, not at goal  HgbA1c pending  IV heparin for VTE prophylaxis  Diet Carb Modified Fluid consistency:: Thin; Room service appropriate?: Yes  no antithrombotic prior to admission, now on IV Heparin  Ongoing aggressive stroke risk factor management  Therapy recommendations: Pending  Disposition: Pending  ? Coital cephalgia  Two episodes of HA during sexual intercourse  Not associated with transverse sinus thrombosis  Follow up as outpt to consider beta blocker if recurrent  Hyperlipidemia  Home meds:  No lipid lowering medications prior to admission  LDL 150, goal < 70  Add Lipitor 40  Also add zetia for high  TG  Continue statin at discharge  Diabetes  HgbA1c pending, goal < 7.0  Uncontrolled  Morbid obesity  Weight loss recommended  Pt is willing to consider  Other Stroke Risk Factors  Cigarette smoker, quit smoking years ago   Obesity, Body mass index is 46.54 kg/(m^2).    Other Active Problems    Other Pertinent History    Hospital day # 1  Delton See [facility] Pager [phone] 06/01/2014, 3:16 PM  I, the attending vascular neurologist, have personally obtained a history, examined the patient, evaluated laboratory data, individually viewed imaging studies and agree with radiology interpretations.  also discussed with pt and [name] regarding his care Holland. Together with the NP/PA, we formulated the assessment and Holland of care which reflects our mutual decision.  I have made any additions or clarifications directly to the above note and agree with the findings and Holland as currently documented.   44 yo M with hx of DM and morbid obesity admitted for HA during sexual intercourse with left facial numbness. Had similar episode one week ago during sex. Consider coital cephalgia. However, MRA and CTA do concern for acute transverse sinus thrombosis, although chronic thrombosis and subarachnoid granulation not able to completely ruled out. Put on heparin IV. If tolerable, will transition to po anticoagulation. Follow up in clinic for possible HA prophylaxis of coital cephalgia.  [name] Plan, MD PhD Stroke Neurology 06/01/2014 5:12 PM   To contact Stroke Continuity provider, please refer to [email or web address]. After hours, contact General Neurology

## 2014-06-01 NOTE — Progress Notes (Signed)
[facility] hospital[facility] progress note. Chief complaint. Transfer note. History of present illness. This 44 year old male presented with onset neck pain and tingling of the left face. CT angiogram of the head/neck showed intracranial sinus thrombosis. Neurology was contacted and recommended that the patient be initiated on a heparin drip and transferred to [facility]. The patient is now arrived in transfer and I'm seeing him at bedside to ensure he remains clinically stable and that his orders transferred appropriately. Physical exam. Vital signs. Temperature 98.6, pulse 60, respiration 20, blood pressure 146/67. O2 sats 96%. General appearance. Well-developed male who is alert and in no distress. Cardiac. Rate and rhythm regular. Lungs. Breath sounds clear and equal. Impression/plan. Problem #1.intracranial sinus thrombosis. Patient arrived in transfer to [facility] obtain MRI/MRA/MRV. Also the clinic the we'll be obtaining hypercoagulability panel. Echocardiogram and carotid Dopplers pending. Problem #2. Diabetes. Will cover with sensitive sliding scale while in hospital. Patient appears clinically stable post transfer. All orders transferred appropriately.

## 2014-06-01 NOTE — Progress Notes (Addendum)
ANTICOAGULATION CONSULT NOTE - Initial Consult  Pharmacy Consult for Heparin IV Indication: L transverse sinus thrombus  No Known Allergies  Patient Measurements: Height: 6\' 1"  (185.4 cm) Weight: (!) 352 lb 11.2 oz (159.984 kg) IBW/kg (Calculated) : 79.9 Heparin Dosing Weight: 118 kg  Vital Signs: Temp: 97.9 F (36.6 C) (05/21 0454) Temp Source: Axillary (05/21 0454) BP: 128/62 mmHg (05/21 0454) Pulse Rate: 60 (05/21 0200)  Labs:  Recent Labs  05/31/14 1805 05/31/14 2206 06/01/14 0448  HGB 18.4*  --   --   HCT 54.0*  --   --   APTT  --  80*  --   LABPROT  --  14.5  --   INR  --  1.11  --   HEPARINUNFRC  --   --  <0.10*  CREATININE 1.00  --   --     Estimated Creatinine Clearance: 150.8 mL/min (by C-G formula based on Cr of 1).   Medical History: Past Medical History  Diagnosis Date  . Diabetes mellitus without complication     Medications:  Scheduled:  .  stroke: mapping our early stages of recovery book   Does not apply Once  . insulin aspart  0-15 Units Subcutaneous 6 times per day  . [START ON 06/02/2014] pneumococcal 23 valent vaccine  0.5 mL Intramuscular Tomorrow-1000   PRN: acetaminophen **OR** acetaminophen, senna-docusate  Assessment: 44yoM with PMH DMT2 presenting with L sided neck pain, found on CTa to have L transverse sinus thrombus.  Started on heparin IV per pharmacy last night 05/31/14 at [facility] [place] then transferred to [facility].  Initial 6 hour heparin level is <0.1, undetectable on 2000 units/hr. RN reports hepairn infusin No bleeding noted. MRI showed no acute stroke.    Prior anticoagulation: none  05/31/14 CT head: Negative for acute or chronic infarction. Negative for hemorrhage or mass lesion 5/20: CT angiogram of the head/neck showed intracranial sinus thrombosis  Goal of Therapy: Heparin level 0.3-0.7 units/ml Monitor platelets by anticoagulation protocol: Yes  Plan:  Heparin 3500 units IV bolus x 1  Increase Heparin 2300 units/hr IV  infusion (increase by 3 units/kg/hr)  Check heparin level 6 hrs after start  Daily CBC, daily heparin level once stable  Monitor for signs of bleeding or thrombosis   Noah Delaine, RPh Clinical Pharmacist Pager: [phone] 06/01/2014, 6:25 AM

## 2014-06-01 NOTE — Progress Notes (Signed)
  Echocardiogram 2D Echocardiogram has been performed.  [name] Holland 06/01/2014, 9:48 AM

## 2014-06-01 NOTE — Progress Notes (Signed)
OT Cancellation Note  Patient Details Name: [name] Holland MRN: [id number] DOB: 12/02/1970   Cancelled Treatment:    Reason Eval/Treat Not Completed: Other (comment). Orders to start 5/22. Earlie Raveling OTR/L [phone] 06/01/2014, 8:20 AM

## 2014-06-01 NOTE — Progress Notes (Signed)
PROGRESS NOTE  [name] Holland QMV:[id number] DOB: [birth year] DOA: 05/31/2014 PCP: No PCP Per Patient  44 year old gentleman with history of diabetes, presented with sudden onset of neck pain and tingling in his left face after strenuous activity. In emergency department CT angiogram of the head and neck showed intracranial sinus thrombosis emergency department has contacted Dr. Roseanne Reno who recommended transfer to [facility] and heparin drip  Assessment/Plan:  intracranial sinus thrombosis -  -heparin drip- change to newer agent???- no insurance - MRI MRA- motion degraded- no acute issues - hypercoagulable panel pending -echocardiogram  -carotid Dopplers  DM type 2 causing neurological disease, not at goal Holding metformin given recent contrast studies. -SSI   Obesity -need OSA eval as outpatient   Code Status: full Family Communication: patient Disposition Plan:    Consultants:  neuro  Procedures:  echo    HPI/Subjective: Asking to go home  Objective: Filed Vitals:   06/01/14 1000  BP: 123/54  Pulse: 99  Temp: 98.2 F (36.8 C)  Resp: 18   No intake or output data in the 24 hours ending 06/01/14 1137 Filed Weights   05/31/14 2134 06/01/14 0200  Weight: 161.027 kg (355 lb) 159.984 kg (352 lb 11.2 oz)    Exam:   General:  A+OX3, NAD  Cardiovascular:rrr  Respiratory: clear  Abdomen: +BS, soft  Musculoskeletal: no edema   Data Reviewed: Basic Metabolic Panel:  Recent Labs Lab 05/31/14 1805  NA 138  K 3.9  CL 100*  GLUCOSE 205*  BUN 9  CREATININE 1.00   Liver Function Tests: No results for input(s): AST, ALT, ALKPHOS, BILITOT, PROT, ALBUMIN in the last 168 hours. No results for input(s): LIPASE, AMYLASE in the last 168 hours. No results for input(s): AMMONIA in the last 168 hours. CBC:  Recent Labs Lab 05/31/14 1805 06/01/14 0754  WBC  --  9.5  HGB 18.4* 16.1  HCT 54.0* 48.9  MCV  --  86.4  PLT  --  179   Cardiac  Enzymes:  Recent Labs Lab 06/01/14 0754  TROPONINI <0.03   BNP (last 3 results) No results for input(s): BNP in the last 8760 hours.  ProBNP (last 3 results) No results for input(s): PROBNP in the last 8760 hours.  CBG:  Recent Labs Lab 06/01/14 0159 06/01/14 0514 06/01/14 0727  GLUCAP 139* 199* 188*    No results found for this or any previous visit (from the past 240 hour(s)).   Studies: Ct Angio Head W/cm &/or Wo Cm  05/31/2014   CLINICAL DATA:  Headache.  Posterior neck pain.  Diabetes.  EXAM: CT ANGIOGRAPHY HEAD AND NECK  TECHNIQUE: Multidetector CT imaging of the head and neck was performed using the standard protocol during bolus administration of intravenous contrast. Multiplanar CT image reconstructions and MIPs were obtained to evaluate the vascular anatomy. Carotid stenosis measurements (when applicable) are obtained utilizing NASCET criteria, using the distal internal carotid diameter as the denominator.  CONTRAST:  OMNIPAQUE IOHEXOL 350 MG/ML SOLN  COMPARISON:  None.  FINDINGS: CT HEAD  Brain: Ventricle size normal. Negative for acute or chronic infarction. Negative for hemorrhage or mass lesion. Normal appearance of the brain.  Calvarium and skull base: Negative  Paranasal sinuses: Negative  Orbits: Negative  Image quality degraded by morbid obesity specially in the neck  CTA NECK  Aortic arch: Limited evaluation of the aortic arch due to patient size. Only the top of the arch was scanned.  Right carotid system: Negative  Left carotid system:  Negative  Vertebral arteries:Left vertebral artery dominant. Small right vertebral artery difficult to evaluate due to small size but no evidence of vertebral artery dissection or stenosis bilaterally.  Skeleton: Negative  Other neck: Negative  CTA HEAD  Anterior circulation: Cavernous carotid widely patent bilaterally. Anterior and middle cerebral arteries normal.  Posterior circulation: Left vertebral artery dominant. Both  vertebral arteries contribute to the basilar. PICA patent bilaterally. Basilar widely patent. Superior cerebellar and posterior cerebral arteries patent bilaterally.  Venous sinuses: Filling defect in the left transverse sinus with irregular margins. This measures approximately 2.5 cm in length and appears be within the sinus. This sinus remains patent. This is most likely thrombus. The patient has left posterior neck pain. Right transverse sinus patent. Sagittal sinus patent.  Anatomic variants: Negative for aneurysm  Delayed phase: Normal enhancement  IMPRESSION: Carotid and vertebral arteries are patent bilaterally. 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  Critical Value/emergent results were called by telephone at the time of interpretation on 05/31/2014 at 7:18 pm to [facility] CAMPRUBI-SOMS PA , who verbally acknowledged these results.   Electronically Signed   By: Marlan Palau M.D.   On: 05/31/2014 19:20   Dg Chest 2 View  06/01/2014   CLINICAL DATA:  Shortness of breath.  EXAM: CHEST  2 VIEW  COMPARISON:  11/08/2007  FINDINGS: Lung volumes are low leading to crowding of bronchovascular structures. The cardiomediastinal contours are normal. No consolidation, pleural effusion, or pneumothorax. No acute osseous abnormalities are seen.  IMPRESSION: Hypoventilatory chest without definite acute process.   Electronically Signed   By: Rubye Oaks M.D.   On: 06/01/2014 05:11   Ct Angio Neck W/cm &/or Wo/cm  05/31/2014   CLINICAL DATA:  Headache.  Posterior neck pain.  Diabetes.  EXAM: CT ANGIOGRAPHY HEAD AND NECK  TECHNIQUE: Multidetector CT imaging of the head and neck was performed using the standard protocol during bolus administration of intravenous contrast. Multiplanar CT image reconstructions and MIPs were obtained to evaluate the vascular anatomy. Carotid stenosis  measurements (when applicable) are obtained utilizing NASCET criteria, using the distal internal carotid diameter as the denominator.  CONTRAST:  OMNIPAQUE IOHEXOL 350 MG/ML SOLN  COMPARISON:  None.  FINDINGS: CT HEAD  Brain: Ventricle size normal. Negative for acute or chronic infarction. Negative for hemorrhage or mass lesion. Normal appearance of the brain.  Calvarium and skull base: Negative  Paranasal sinuses: Negative  Orbits: Negative  Image quality degraded by morbid obesity specially in the neck  CTA NECK  Aortic arch: Limited evaluation of the aortic arch due to patient size. Only the top of the arch was scanned.  Right carotid system: Negative  Left carotid system: Negative  Vertebral arteries:Left vertebral artery dominant. Small right vertebral artery difficult to evaluate due to small size but no evidence of vertebral artery dissection or stenosis bilaterally.  Skeleton: Negative  Other neck: Negative  CTA HEAD  Anterior circulation: Cavernous carotid widely patent bilaterally. Anterior and middle cerebral arteries normal.  Posterior circulation: Left vertebral artery dominant. Both vertebral arteries contribute to the basilar. PICA patent bilaterally. Basilar widely patent. Superior cerebellar and posterior cerebral arteries patent bilaterally.  Venous sinuses: Filling defect in the left transverse sinus with irregular margins. This measures approximately 2.5 cm in length and appears be within the sinus. This sinus remains patent. This is most likely thrombus. The patient has left posterior  neck pain. Right transverse sinus patent. Sagittal sinus patent.  Anatomic variants: Negative for aneurysm  Delayed phase: Normal enhancement  IMPRESSION: Carotid and vertebral arteries are patent bilaterally. 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  Critical Value/emergent  results were called by telephone at the time of interpretation on 05/31/2014 at 7:18 pm to [facility] CAMPRUBI-SOMS PA , who verbally acknowledged these results.   Electronically Signed   By: Marlan Palau M.D.   On: 05/31/2014 19:20   Mr Brain Wo Contrast  06/01/2014   CLINICAL DATA:  Larey Seat sharp pop and LEFT neck at 4 p.m. with subsequent severe pain and tingling LEFT face. CT angiogram demonstrating LEFT transverse sinus thrombosis. History of diabetes and stroke.  EXAM: MRI HEAD WITHOUT CONTRAST  MRA HEAD WITHOUT CONTRAST  MRV HEAD WITHOUT CONTRAST  TECHNIQUE: Multiplanar, multiecho pulse sequences of the brain and surrounding structures were obtained without and with intravenous contrast. Angiographic images of the head were obtained using MRA and MRV technique without contrast. MIP images provided.  COMPARISON:  CT angiogram of the head and neck May 31, 2014  FINDINGS: MRI HEAD FINDINGS  Larger body habitus results in overall decreased signal to noise ratio. Mild motion degraded examination. The ventricles and sulci are normal for patient's age. No abnormal parenchymal signal, mass lesions, mass effect. No reduced diffusion to suggest acute ischemia. No susceptibility artifact to suggest hemorrhage.  No abnormal extra-axial fluid collections. No extra-axial masses though, contrast enhanced sequences would be more sensitive. Normal major intracranial vascular flow voids seen at the skull base. No abnormal intrinsic T1 shortening with particular attention to the LEFT transverse sinus.  Ocular globes and orbital contents are unremarkable though not tailored for evaluation. No abnormal sellar expansion. Small LEFT maxillary mucosal retention cyst without paranasal sinus air-fluid levels. No suspicious calvarial bone marrow signal. No abnormal sellar expansion. Craniocervical junction maintained.  MRA HEAD FINDINGS  Larger body habitus results in overall decreased signal to noise ratio. Moderately motion degraded  examination.  Anterior circulation: Normal flow related enhancement of the included cervical, petrous, cavernous and supra clinoid internal carotid arteries. Patent anterior communicating artery. Normal flow related enhancement of the anterior and middle cerebral arteries, including more distal segments.  No large vessel occlusion, high-grade stenosis, abnormal luminal irregularity, aneurysm.  Posterior circulation: LEFT vertebral artery is dominant. Basilar artery is patent, with normal flow related enhancement ; motion obscures the main branch vessels. Normal flow related enhancement of the posterior cerebral arteries.  No large vessel occlusion, high-grade stenosis. Motion degrades sensitivity for luminal irregularity or small aneurysm.  MRV HEAD FINDINGS  Normal flow related enhancement superior sagittal sinus and torcula. Nonocclusive focal tubular low signal within the LEFT transverse sinus corresponding to angiographic abnormality. The RIGHT transverse sinus is dominant. Normal flow related enhancement of the sigmoid sinuses, internal jugular veins. Normal flow related enhancement of the internal cerebral veins though, not tailored for evaluation.  IMPRESSION: Habitus and motion degraded examination.  Normal noncontrast MRI of the brain with aforementioned limitations.  Motion degraded MRA without large vessel occlusion.  Tubular nonocclusive filling defect LEFT transverse sinus concerning for acute thrombosis as seen on prior CT of the head.   Electronically Signed   By: Awilda Metro   On: 06/01/2014 04:41   Mr Maxine Glenn Head/brain Wo Cm  06/01/2014   CLINICAL DATA:  Larey Seat sharp pop and LEFT neck at  4 p.m. with subsequent severe pain and tingling LEFT face. CT angiogram demonstrating LEFT transverse sinus thrombosis. History of diabetes and stroke.  EXAM: MRI HEAD WITHOUT CONTRAST  MRA HEAD WITHOUT CONTRAST  MRV HEAD WITHOUT CONTRAST  TECHNIQUE: Multiplanar, multiecho pulse sequences of the brain and  surrounding structures were obtained without and with intravenous contrast. Angiographic images of the head were obtained using MRA and MRV technique without contrast. MIP images provided.  COMPARISON:  CT angiogram of the head and neck May 31, 2014  FINDINGS: MRI HEAD FINDINGS  Larger body habitus results in overall decreased signal to noise ratio. Mild motion degraded examination. The ventricles and sulci are normal for patient's age. No abnormal parenchymal signal, mass lesions, mass effect. No reduced diffusion to suggest acute ischemia. No susceptibility artifact to suggest hemorrhage.  No abnormal extra-axial fluid collections. No extra-axial masses though, contrast enhanced sequences would be more sensitive. Normal major intracranial vascular flow voids seen at the skull base. No abnormal intrinsic T1 shortening with particular attention to the LEFT transverse sinus.  Ocular globes and orbital contents are unremarkable though not tailored for evaluation. No abnormal sellar expansion. Small LEFT maxillary mucosal retention cyst without paranasal sinus air-fluid levels. No suspicious calvarial bone marrow signal. No abnormal sellar expansion. Craniocervical junction maintained.  MRA HEAD FINDINGS  Larger body habitus results in overall decreased signal to noise ratio. Moderately motion degraded examination.  Anterior circulation: Normal flow related enhancement of the included cervical, petrous, cavernous and supra clinoid internal carotid arteries. Patent anterior communicating artery. Normal flow related enhancement of the anterior and middle cerebral arteries, including more distal segments.  No large vessel occlusion, high-grade stenosis, abnormal luminal irregularity, aneurysm.  Posterior circulation: LEFT vertebral artery is dominant. Basilar artery is patent, with normal flow related enhancement ; motion obscures the main branch vessels. Normal flow related enhancement of the posterior cerebral arteries.   No large vessel occlusion, high-grade stenosis. Motion degrades sensitivity for luminal irregularity or small aneurysm.  MRV HEAD FINDINGS  Normal flow related enhancement superior sagittal sinus and torcula. Nonocclusive focal tubular low signal within the LEFT transverse sinus corresponding to angiographic abnormality. The RIGHT transverse sinus is dominant. Normal flow related enhancement of the sigmoid sinuses, internal jugular veins. Normal flow related enhancement of the internal cerebral veins though, not tailored for evaluation.  IMPRESSION: Habitus and motion degraded examination.  Normal noncontrast MRI of the brain with aforementioned limitations.  Motion degraded MRA without large vessel occlusion.  Tubular nonocclusive filling defect LEFT transverse sinus concerning for acute thrombosis as seen on prior CT of the head.   Electronically Signed   By: Awilda Metro   On: 06/01/2014 04:41   Mr Mrv Head Wo Cm  06/01/2014   CLINICAL DATA:  Larey Seat sharp pop and LEFT neck at 4 p.m. with subsequent severe pain and tingling LEFT face. CT angiogram demonstrating LEFT transverse sinus thrombosis. History of diabetes and stroke.  EXAM: MRI HEAD WITHOUT CONTRAST  MRA HEAD WITHOUT CONTRAST  MRV HEAD WITHOUT CONTRAST  TECHNIQUE: Multiplanar, multiecho pulse sequences of the brain and surrounding structures were obtained without and with intravenous contrast. Angiographic images of the head were obtained using MRA and MRV technique without contrast. MIP images provided.  COMPARISON:  CT angiogram of the head and neck May 31, 2014  FINDINGS: MRI HEAD FINDINGS  Larger body habitus results in overall decreased signal to noise ratio. Mild motion degraded examination. The ventricles and sulci are normal for patient's age. No abnormal parenchymal  signal, mass lesions, mass effect. No reduced diffusion to suggest acute ischemia. No susceptibility artifact to suggest hemorrhage.  No abnormal extra-axial fluid collections.  No extra-axial masses though, contrast enhanced sequences would be more sensitive. Normal major intracranial vascular flow voids seen at the skull base. No abnormal intrinsic T1 shortening with particular attention to the LEFT transverse sinus.  Ocular globes and orbital contents are unremarkable though not tailored for evaluation. No abnormal sellar expansion. Small LEFT maxillary mucosal retention cyst without paranasal sinus air-fluid levels. No suspicious calvarial bone marrow signal. No abnormal sellar expansion. Craniocervical junction maintained.  MRA HEAD FINDINGS  Larger body habitus results in overall decreased signal to noise ratio. Moderately motion degraded examination.  Anterior circulation: Normal flow related enhancement of the included cervical, petrous, cavernous and supra clinoid internal carotid arteries. Patent anterior communicating artery. Normal flow related enhancement of the anterior and middle cerebral arteries, including more distal segments.  No large vessel occlusion, high-grade stenosis, abnormal luminal irregularity, aneurysm.  Posterior circulation: LEFT vertebral artery is dominant. Basilar artery is patent, with normal flow related enhancement ; motion obscures the main branch vessels. Normal flow related enhancement of the posterior cerebral arteries.  No large vessel occlusion, high-grade stenosis. Motion degrades sensitivity for luminal irregularity or small aneurysm.  MRV HEAD FINDINGS  Normal flow related enhancement superior sagittal sinus and torcula. Nonocclusive focal tubular low signal within the LEFT transverse sinus corresponding to angiographic abnormality. The RIGHT transverse sinus is dominant. Normal flow related enhancement of the sigmoid sinuses, internal jugular veins. Normal flow related enhancement of the internal cerebral veins though, not tailored for evaluation.  IMPRESSION: Habitus and motion degraded examination.  Normal noncontrast MRI of the brain with  aforementioned limitations.  Motion degraded MRA without large vessel occlusion.  Tubular nonocclusive filling defect LEFT transverse sinus concerning for acute thrombosis as seen on prior CT of the head.   Electronically Signed   By: Awilda Metro   On: 06/01/2014 04:41    Scheduled Meds: .  stroke: mapping our early stages of recovery book   Does not apply Once  . insulin aspart  0-15 Units Subcutaneous 6 times per day  . [START ON 06/02/2014] pneumococcal 23 valent vaccine  0.5 mL Intramuscular Tomorrow-1000   Continuous Infusions: . sodium chloride 75 mL/hr at 06/01/14 0216  . heparin 2,300 Units/hr (06/01/14 0649)   Antibiotics Given (last 72 hours)    None      Active Problems:   CVA (cerebral infarction)   DM type 2 causing neurological disease, not at goal   Cerebral venous sinus thrombosis, acute    Time spent: 25 min    Marella Vanderpol  [facility] Pager [phone]. If 7PM-7AM, please contact night-coverage at [email or web address], password [facility] 06/01/2014, 11:37 AM  LOS: 1 day

## 2014-06-01 NOTE — Progress Notes (Addendum)
Patient arrived to 4N17 via carelink. VSS. Patient oriented to room. Heparin drip infusing.Telemetry applied, will continue to monitor patient closely. Monia Pouch, RN

## 2014-06-01 NOTE — Progress Notes (Signed)
ANTICOAGULATION CONSULT NOTE  Pharmacy Consult for Heparin IV Indication: L transverse sinus thrombus  No Known Allergies  Patient Measurements: Height: 6\' 1"  (185.4 cm) Weight: (!) 352 lb 11.2 oz (159.984 kg) IBW/kg (Calculated) : 79.9 Heparin Dosing Weight: 118 kg  Vital Signs: Temp: 98.2 F (36.8 C) (05/21 1000) Temp Source: Oral (05/21 1000) BP: 123/54 mmHg (05/21 1000) Pulse Rate: 99 (05/21 1000)  Labs:  Recent Labs  05/31/14 1805 05/31/14 2206 06/01/14 0448 06/01/14 0754 06/01/14 1309  HGB 18.4*  --   --  16.1  --   HCT 54.0*  --   --  48.9  --   PLT  --   --   --  179  --   APTT  --  80*  --   --   --   LABPROT  --  14.5  --   --   --   INR  --  1.11  --   --   --   HEPARINUNFRC  --   --  <0.10*  --  0.16*  CREATININE 1.00  --   --   --   --   TROPONINI  --   --   --  <0.03  --     Estimated Creatinine Clearance: 150.8 mL/min (by C-G formula based on Cr of 1).  Assessment: 44yoM with PMH DMT2 presenting with L sided neck pain, found on CTA to have L transverse sinus thrombus. Decision made to start IV heparin. Hypercoagulable panel has been sent- awaiting results. Neuro asked [place] to keep patient on lower end of goal.  Heparin level was undetectable this morning- he was bolused and rate was increased. Now heparin level improved but still low at 0.16 units/mL. Spoke with RN and no issues with line and gtt was not stopped.  Hgb 16.1, plts 179- no bleeding noted   Goal of Therapy: Heparin level 0.3-0.5 units/ml Monitor platelets by anticoagulation protocol: Yes  Plan: -incr hep gtt to 2550 units/hr -HL at 2300 -daily HL and CBC -f/u hypercoag panel (sent 5/21) -f/u plans for long term AC  Oluwakemi Salsberry D. Abdalrahman Clementson, PharmD, BCPS Clinical Pharmacist Pager: [phone] 06/01/2014 2:29 PM

## 2014-06-01 NOTE — Progress Notes (Signed)
VASCULAR LAB PRELIMINARY  PRELIMINARY  PRELIMINARY  PRELIMINARY  Carotid duplex completed.    Preliminary report:  Bilateral:  1-39% ICA stenosis.  Vertebral artery flow is antegrade.     [name] Holland, RVS 06/01/2014, 2:12 PM

## 2014-06-01 NOTE — Progress Notes (Signed)
ANTICOAGULATION CONSULT NOTE - Follow Up Consult  Pharmacy Consult for heparin Indication: sinus thombus  Labs:  Recent Labs  05/31/14 1805 05/31/14 2206 06/01/14 0448 06/01/14 0754 06/01/14 1309 06/01/14 2235  HGB 18.4*  --   --  16.1  --   --   HCT 54.0*  --   --  48.9  --   --   PLT  --   --   --  179  --   --   APTT  --  80*  --   --   --   --   LABPROT  --  14.5  --   --   --   --   INR  --  1.11  --   --   --   --   HEPARINUNFRC  --   --  <0.10*  --  0.16* 0.42  CREATININE 1.00  --   --   --   --   --   TROPONINI  --   --   --  <0.03 0.04*  --     Assessment/Plan:  44yo male therapeutic on heparin after rate changes. Will continue gtt at current rate and confirm stable with am labs.   Vernard Gambles, PharmD, BCPS  06/01/2014,11:52 PM

## 2014-06-01 NOTE — Consult Note (Signed)
Admission H&P    Chief Complaint: Left neck pain and numbness involving left side of face.  HPI: [name] Holland is an 44 y.o. male history diabetes mellitus and obesity presenting with acute onset of involving left side of his neck followed by numbness and tingling involving left side of the face. Onset of symptoms was associated with exertion. He had no visual changes and no changes in speech. He also had no focal motor or sensory changes involving extremities. CT angiogram of head and neck showed findings consistent with lateral sinus rhombus which appeared to be acute. Findings were also seen on MRV of the head. MRI showed no acute stroke. Patient was started on IV heparin drip and a coagulation. NIH stroke score was 0.  LSN: [id number] on 05/31/2014 tPA Given: No: Minimal subjective sensory symptoms only. mRankin:  Past Medical History  Diagnosis Date  . Diabetes mellitus without complication     Past Surgical History  Procedure Laterality Date  . Knee arthroscopy      Family History  Problem Relation Age of Onset  . Other Mother   . Diabetes type II Mother   . CAD Father    Social History:  reports that he has quit smoking. He does not have any smokeless tobacco history on file. He reports that he does not drink alcohol or use illicit drugs.  Allergies: No Known Allergies  Medications Prior to Admission  Medication Sig Dispense Refill  . hydrocortisone 2.5 % lotion Apply topically 2 (two) times daily. (Patient not taking: Reported on 05/31/2014) 59 mL 0  . metFORMIN (GLUCOPHAGE) 500 MG tablet Take 1 tablet (500 mg total) by mouth 2 (two) times daily with a meal. (Patient not taking: Reported on 05/31/2014) 30 tablet 0  . metFORMIN (GLUCOPHAGE) 500 MG tablet Take 1 tablet (500 mg total) by mouth daily with breakfast. 30 tablet 1    ROS: History obtained from the patient  General ROS: negative for - chills, fatigue, fever, night sweats, weight gain or weight loss Psychological  ROS: negative for - behavioral disorder, hallucinations, memory difficulties, mood swings or suicidal ideation Ophthalmic ROS: negative for - blurry vision, double vision, eye pain or loss of vision ENT ROS: negative for - epistaxis, nasal discharge, oral lesions, sore throat, tinnitus or vertigo Allergy and Immunology ROS: negative for - hives or itchy/watery eyes Hematological and Lymphatic ROS: negative for - bleeding problems, bruising or swollen lymph nodes Endocrine ROS: negative for - galactorrhea, hair pattern changes, polydipsia/polyuria or temperature intolerance Respiratory ROS: negative for - cough, hemoptysis, shortness of breath or wheezing Cardiovascular ROS: negative for - chest pain, dyspnea on exertion, edema or irregular heartbeat Gastrointestinal ROS: negative for - abdominal pain, diarrhea, hematemesis, nausea/vomiting or stool incontinence Genito-Urinary ROS: negative for - dysuria, hematuria, incontinence or urinary frequency/urgency Musculoskeletal ROS: negative for - joint swelling or muscular weakness Neurological ROS: as noted in HPI Dermatological ROS: negative for rash and skin lesion changes  Physical Examination: Blood pressure 128/62, pulse 60, temperature 97.9 F (36.6 C), temperature source Axillary, resp. rate 18, height 6\' 1"  (1.854 m), weight 159.984 kg (352 lb 11.2 oz), SpO2 94 %.  HEENT-  Normocephalic, no lesions, without obvious abnormality.  Normal external eye and conjunctiva.  Normal TM's bilaterally.  Normal auditory canals and external ears. Normal external nose, mucus membranes and septum.  Normal pharynx. Neck supple with no masses, nodes, nodules or enlargement. Cardiovascular - regular rate and rhythm, S1, S2 normal, no murmur, click, rub or gallop  Lungs - chest clear, no wheezing, rales, normal symmetric air entry Abdomen - soft, non-tender; bowel sounds normal; no masses,  no organomegaly Extremities - no joint deformities, effusion, or  inflammation, no edema and no skin discoloration  Neurologic Examination: Mental Status: Alert, oriented, thought content appropriate.  Speech fluent without evidence of aphasia. Able to follow commands without difficulty. Cranial Nerves: II-Visual fields were normal. III/IV/VI-Pupils were equal and reacted normally to light. Extraocular movements were full and conjugate.    V/VII-no facial numbness and no facial weakness. VIII-normal. X-normal speech and symmetrical palatal movement. XI: trapezius strength/neck flexion strength normal bilaterally XII-midline tongue extension with normal strength. Motor: 5/5 bilaterally with normal tone and bulk Sensory: Normal throughout. Deep Tendon Reflexes: Trace to 1+ and symmetric in upper extremities and absent in lower extremities. Plantars: Mute bilaterally Cerebellar: Normal finger-to-nose testing. Carotid auscultation: Normal  Results for orders placed or performed during the hospital encounter of 05/31/14 (from the past 48 hour(s))  I-stat chem 8, ed     Status: Abnormal   Collection Time: 05/31/14  6:05 PM  Result Value Ref Range   Sodium 138 135 - 145 mmol/L   Potassium 3.9 3.5 - 5.1 mmol/L   Chloride 100 (L) 101 - 111 mmol/L   BUN 9 6 - 20 mg/dL   Creatinine, Ser 1.61 0.61 - 1.24 mg/dL   Glucose, Bld 096 (H) 65 - 99 mg/dL   Calcium, Ion 0.45 4.09 - 1.23 mmol/L   TCO2 22 0 - 100 mmol/L   Hemoglobin 18.4 (H) 13.0 - 17.0 g/dL   HCT 81.1 (H) 91.4 - 78.2 %  Protime-INR     Status: None   Collection Time: 05/31/14 10:06 PM  Result Value Ref Range   Prothrombin Time 14.5 11.6 - 15.2 seconds   INR 1.11 0.00 - 1.49  APTT     Status: Abnormal   Collection Time: 05/31/14 10:06 PM  Result Value Ref Range   aPTT 80 (H) 24 - 37 seconds    Comment:        IF BASELINE aPTT IS ELEVATED, SUGGEST PATIENT RISK ASSESSMENT BE USED TO DETERMINE APPROPRIATE ANTICOAGULANT THERAPY.   Urinalysis, Routine w reflex microscopic     Status: Abnormal    Collection Time: 05/31/14 11:31 PM  Result Value Ref Range   Color, Urine YELLOW YELLOW   APPearance CLEAR CLEAR   Specific Gravity, Urine >1.046 (H) 1.005 - 1.030   pH 5.5 5.0 - 8.0   Glucose, UA 100 (A) NEGATIVE mg/dL   Hgb urine dipstick TRACE (A) NEGATIVE   Bilirubin Urine NEGATIVE NEGATIVE   Ketones, ur NEGATIVE NEGATIVE mg/dL   Protein, ur 956 (A) NEGATIVE mg/dL   Urobilinogen, UA 0.2 0.0 - 1.0 mg/dL   Nitrite NEGATIVE NEGATIVE   Leukocytes, UA NEGATIVE NEGATIVE  Urine rapid drug screen (hosp performed)     Status: None   Collection Time: 05/31/14 11:31 PM  Result Value Ref Range   Opiates NONE DETECTED NONE DETECTED   Cocaine NONE DETECTED NONE DETECTED   Benzodiazepines NONE DETECTED NONE DETECTED   Amphetamines NONE DETECTED NONE DETECTED   Tetrahydrocannabinol NONE DETECTED NONE DETECTED   Barbiturates NONE DETECTED NONE DETECTED    Comment:        DRUG SCREEN FOR MEDICAL PURPOSES ONLY.  IF CONFIRMATION IS NEEDED FOR ANY PURPOSE, NOTIFY LAB WITHIN 5 DAYS.        LOWEST DETECTABLE LIMITS FOR URINE DRUG SCREEN Drug Class       Cutoff (  ng/mL) Amphetamine      1000 Barbiturate      200 Benzodiazepine   200 Tricyclics       300 Opiates          300 Cocaine          300 THC              50   Urine microscopic-add on     Status: None   Collection Time: 05/31/14 11:31 PM  Result Value Ref Range   Squamous Epithelial / LPF RARE RARE   WBC, UA 0-2 <3 WBC/hpf   RBC / HPF 0-2 <3 RBC/hpf   Bacteria, UA RARE RARE  Glucose, capillary     Status: Abnormal   Collection Time: 06/01/14  1:59 AM  Result Value Ref Range   Glucose-Capillary 139 (H) 65 - 99 mg/dL   Comment 1 Notify RN    Comment 2 Document in Chart    Ct Angio Head W/cm &/or Wo Cm  05/31/2014   CLINICAL DATA:  Headache.  Posterior neck pain.  Diabetes.  EXAM: CT ANGIOGRAPHY HEAD AND NECK  TECHNIQUE: Multidetector CT imaging of the head and neck was performed using the standard protocol during bolus  administration of intravenous contrast. Multiplanar CT image reconstructions and MIPs were obtained to evaluate the vascular anatomy. Carotid stenosis measurements (when applicable) are obtained utilizing NASCET criteria, using the distal internal carotid diameter as the denominator.  CONTRAST:  OMNIPAQUE IOHEXOL 350 MG/ML SOLN  COMPARISON:  None.  FINDINGS: CT HEAD  Brain: Ventricle size normal. Negative for acute or chronic infarction. Negative for hemorrhage or mass lesion. Normal appearance of the brain.  Calvarium and skull base: Negative  Paranasal sinuses: Negative  Orbits: Negative  Image quality degraded by morbid obesity specially in the neck  CTA NECK  Aortic arch: Limited evaluation of the aortic arch due to patient size. Only the top of the arch was scanned.  Right carotid system: Negative  Left carotid system: Negative  Vertebral arteries:Left vertebral artery dominant. Small right vertebral artery difficult to evaluate due to small size but no evidence of vertebral artery dissection or stenosis bilaterally.  Skeleton: Negative  Other neck: Negative  CTA HEAD  Anterior circulation: Cavernous carotid widely patent bilaterally. Anterior and middle cerebral arteries normal.  Posterior circulation: Left vertebral artery dominant. Both vertebral arteries contribute to the basilar. PICA patent bilaterally. Basilar widely patent. Superior cerebellar and posterior cerebral arteries patent bilaterally.  Venous sinuses: Filling defect in the left transverse sinus with irregular margins. This measures approximately 2.5 cm in length and appears be within the sinus. This sinus remains patent. This is most likely thrombus. The patient has left posterior neck pain. Right transverse sinus patent. Sagittal sinus patent.  Anatomic variants: Negative for aneurysm  Delayed phase: Normal enhancement  IMPRESSION: Carotid and vertebral arteries are patent bilaterally. Right vertebral artery is small and hypoplastic and  difficult to evaluate due to its size  Filling defect left transverse sinus compatible with thrombus which appears acute.  Critical Value/emergent results were called by telephone at the time of interpretation on 05/31/2014 at 7:18 pm to [facility] CAMPRUBI-SOMS PA , who verbally acknowledged these results.   Electronically Signed   By: Marlan Palau M.D.   On: 05/31/2014 19:20   Dg Chest 2 View  06/01/2014   CLINICAL DATA:  Shortness of breath.  EXAM: CHEST  2 VIEW  COMPARISON:  11/08/2007  FINDINGS: Lung volumes are low leading to crowding of  bronchovascular structures. The cardiomediastinal contours are normal. No consolidation, pleural effusion, or pneumothorax. No acute osseous abnormalities are seen.  IMPRESSION: Hypoventilatory chest without definite acute process.   Electronically Signed   By: Rubye Oaks M.D.   On: 06/01/2014 05:11   Ct Angio Neck W/cm &/or Wo/cm  05/31/2014   CLINICAL DATA:  Headache.  Posterior neck pain.  Diabetes.  EXAM: CT ANGIOGRAPHY HEAD AND NECK  TECHNIQUE: Multidetector CT imaging of the head and neck was performed using the standard protocol during bolus administration of intravenous contrast. Multiplanar CT image reconstructions and MIPs were obtained to evaluate the vascular anatomy. Carotid stenosis measurements (when applicable) are obtained utilizing NASCET criteria, using the distal internal carotid diameter as the denominator.  CONTRAST:  OMNIPAQUE IOHEXOL 350 MG/ML SOLN  COMPARISON:  None.  FINDINGS: CT HEAD  Brain: Ventricle size normal. Negative for acute or chronic infarction. Negative for hemorrhage or mass lesion. Normal appearance of the brain.  Calvarium and skull base: Negative  Paranasal sinuses: Negative  Orbits: Negative  Image quality degraded by morbid obesity specially in the neck  CTA NECK  Aortic arch: Limited evaluation of the aortic arch due to patient size. Only the top of the arch was scanned.  Right carotid system: Negative  Left carotid  system: Negative  Vertebral arteries:Left vertebral artery dominant. Small right vertebral artery difficult to evaluate due to small size but no evidence of vertebral artery dissection or stenosis bilaterally.  Skeleton: Negative  Other neck: Negative  CTA HEAD  Anterior circulation: Cavernous carotid widely patent bilaterally. Anterior and middle cerebral arteries normal.  Posterior circulation: Left vertebral artery dominant. Both vertebral arteries contribute to the basilar. PICA patent bilaterally. Basilar widely patent. Superior cerebellar and posterior cerebral arteries patent bilaterally.  Venous sinuses: Filling defect in the left transverse sinus with irregular margins. This measures approximately 2.5 cm in length and appears be within the sinus. This sinus remains patent. This is most likely thrombus. The patient has left posterior neck pain. Right transverse sinus patent. Sagittal sinus patent.  Anatomic variants: Negative for aneurysm  Delayed phase: Normal enhancement  IMPRESSION: Carotid and vertebral arteries are patent bilaterally. 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  Critical Value/emergent results were called by telephone at the time of interpretation on 05/31/2014 at 7:18 pm to [facility] CAMPRUBI-SOMS PA , who verbally acknowledged these results.   Electronically Signed   By: Marlan Palau M.D.   On: 05/31/2014 19:20   Mr Brain Wo Contrast  06/01/2014   CLINICAL DATA:  Larey Seat sharp pop and LEFT neck at 4 p.m. with subsequent severe pain and tingling LEFT face. CT angiogram demonstrating LEFT transverse sinus thrombosis. History of diabetes and stroke.  EXAM: MRI HEAD WITHOUT CONTRAST  MRA HEAD WITHOUT CONTRAST  MRV HEAD WITHOUT CONTRAST  TECHNIQUE: Multiplanar, multiecho pulse sequences of the brain and surrounding structures were obtained without and with  intravenous contrast. Angiographic images of the head were obtained using MRA and MRV technique without contrast. MIP images provided.  COMPARISON:  CT angiogram of the head and neck May 31, 2014  FINDINGS: MRI HEAD FINDINGS  Larger body habitus results in overall decreased signal to noise ratio. Mild motion degraded examination. The ventricles and sulci are normal for patient's age. No abnormal parenchymal signal, mass lesions, mass effect. No reduced diffusion to suggest acute ischemia. No susceptibility artifact to  suggest hemorrhage.  No abnormal extra-axial fluid collections. No extra-axial masses though, contrast enhanced sequences would be more sensitive. Normal major intracranial vascular flow voids seen at the skull base. No abnormal intrinsic T1 shortening with particular attention to the LEFT transverse sinus.  Ocular globes and orbital contents are unremarkable though not tailored for evaluation. No abnormal sellar expansion. Small LEFT maxillary mucosal retention cyst without paranasal sinus air-fluid levels. No suspicious calvarial bone marrow signal. No abnormal sellar expansion. Craniocervical junction maintained.  MRA HEAD FINDINGS  Larger body habitus results in overall decreased signal to noise ratio. Moderately motion degraded examination.  Anterior circulation: Normal flow related enhancement of the included cervical, petrous, cavernous and supra clinoid internal carotid arteries. Patent anterior communicating artery. Normal flow related enhancement of the anterior and middle cerebral arteries, including more distal segments.  No large vessel occlusion, high-grade stenosis, abnormal luminal irregularity, aneurysm.  Posterior circulation: LEFT vertebral artery is dominant. Basilar artery is patent, with normal flow related enhancement ; motion obscures the main branch vessels. Normal flow related enhancement of the posterior cerebral arteries.  No large vessel occlusion, high-grade stenosis.  Motion degrades sensitivity for luminal irregularity or small aneurysm.  MRV HEAD FINDINGS  Normal flow related enhancement superior sagittal sinus and torcula. Nonocclusive focal tubular low signal within the LEFT transverse sinus corresponding to angiographic abnormality. The RIGHT transverse sinus is dominant. Normal flow related enhancement of the sigmoid sinuses, internal jugular veins. Normal flow related enhancement of the internal cerebral veins though, not tailored for evaluation.  IMPRESSION: Habitus and motion degraded examination.  Normal noncontrast MRI of the brain with aforementioned limitations.  Motion degraded MRA without large vessel occlusion.  Tubular nonocclusive filling defect LEFT transverse sinus concerning for acute thrombosis as seen on prior CT of the head.   Electronically Signed   By: Awilda Metro   On: 06/01/2014 04:41   Mr Maxine Glenn Head/brain Wo Cm  06/01/2014   CLINICAL DATA:  Larey Seat sharp pop and LEFT neck at 4 p.m. with subsequent severe pain and tingling LEFT face. CT angiogram demonstrating LEFT transverse sinus thrombosis. History of diabetes and stroke.  EXAM: MRI HEAD WITHOUT CONTRAST  MRA HEAD WITHOUT CONTRAST  MRV HEAD WITHOUT CONTRAST  TECHNIQUE: Multiplanar, multiecho pulse sequences of the brain and surrounding structures were obtained without and with intravenous contrast. Angiographic images of the head were obtained using MRA and MRV technique without contrast. MIP images provided.  COMPARISON:  CT angiogram of the head and neck May 31, 2014  FINDINGS: MRI HEAD FINDINGS  Larger body habitus results in overall decreased signal to noise ratio. Mild motion degraded examination. The ventricles and sulci are normal for patient's age. No abnormal parenchymal signal, mass lesions, mass effect. No reduced diffusion to suggest acute ischemia. No susceptibility artifact to suggest hemorrhage.  No abnormal extra-axial fluid collections. No extra-axial masses though, contrast  enhanced sequences would be more sensitive. Normal major intracranial vascular flow voids seen at the skull base. No abnormal intrinsic T1 shortening with particular attention to the LEFT transverse sinus.  Ocular globes and orbital contents are unremarkable though not tailored for evaluation. No abnormal sellar expansion. Small LEFT maxillary mucosal retention cyst without paranasal sinus air-fluid levels. No suspicious calvarial bone marrow signal. No abnormal sellar expansion. Craniocervical junction maintained.  MRA HEAD FINDINGS  Larger body habitus results in overall decreased signal to noise ratio. Moderately motion degraded examination.  Anterior circulation: Normal flow related enhancement of the included cervical, petrous, cavernous and supra clinoid  internal carotid arteries. Patent anterior communicating artery. Normal flow related enhancement of the anterior and middle cerebral arteries, including more distal segments.  No large vessel occlusion, high-grade stenosis, abnormal luminal irregularity, aneurysm.  Posterior circulation: LEFT vertebral artery is dominant. Basilar artery is patent, with normal flow related enhancement ; motion obscures the main branch vessels. Normal flow related enhancement of the posterior cerebral arteries.  No large vessel occlusion, high-grade stenosis. Motion degrades sensitivity for luminal irregularity or small aneurysm.  MRV HEAD FINDINGS  Normal flow related enhancement superior sagittal sinus and torcula. Nonocclusive focal tubular low signal within the LEFT transverse sinus corresponding to angiographic abnormality. The RIGHT transverse sinus is dominant. Normal flow related enhancement of the sigmoid sinuses, internal jugular veins. Normal flow related enhancement of the internal cerebral veins though, not tailored for evaluation.  IMPRESSION: Habitus and motion degraded examination.  Normal noncontrast MRI of the brain with aforementioned limitations.  Motion  degraded MRA without large vessel occlusion.  Tubular nonocclusive filling defect LEFT transverse sinus concerning for acute thrombosis as seen on prior CT of the head.   Electronically Signed   By: Awilda Metro   On: 06/01/2014 04:41   Mr Mrv Head Wo Cm  06/01/2014   CLINICAL DATA:  Larey Seat sharp pop and LEFT neck at 4 p.m. with subsequent severe pain and tingling LEFT face. CT angiogram demonstrating LEFT transverse sinus thrombosis. History of diabetes and stroke.  EXAM: MRI HEAD WITHOUT CONTRAST  MRA HEAD WITHOUT CONTRAST  MRV HEAD WITHOUT CONTRAST  TECHNIQUE: Multiplanar, multiecho pulse sequences of the brain and surrounding structures were obtained without and with intravenous contrast. Angiographic images of the head were obtained using MRA and MRV technique without contrast. MIP images provided.  COMPARISON:  CT angiogram of the head and neck May 31, 2014  FINDINGS: MRI HEAD FINDINGS  Larger body habitus results in overall decreased signal to noise ratio. Mild motion degraded examination. The ventricles and sulci are normal for patient's age. No abnormal parenchymal signal, mass lesions, mass effect. No reduced diffusion to suggest acute ischemia. No susceptibility artifact to suggest hemorrhage.  No abnormal extra-axial fluid collections. No extra-axial masses though, contrast enhanced sequences would be more sensitive. Normal major intracranial vascular flow voids seen at the skull base. No abnormal intrinsic T1 shortening with particular attention to the LEFT transverse sinus.  Ocular globes and orbital contents are unremarkable though not tailored for evaluation. No abnormal sellar expansion. Small LEFT maxillary mucosal retention cyst without paranasal sinus air-fluid levels. No suspicious calvarial bone marrow signal. No abnormal sellar expansion. Craniocervical junction maintained.  MRA HEAD FINDINGS  Larger body habitus results in overall decreased signal to noise ratio. Moderately motion  degraded examination.  Anterior circulation: Normal flow related enhancement of the included cervical, petrous, cavernous and supra clinoid internal carotid arteries. Patent anterior communicating artery. Normal flow related enhancement of the anterior and middle cerebral arteries, including more distal segments.  No large vessel occlusion, high-grade stenosis, abnormal luminal irregularity, aneurysm.  Posterior circulation: LEFT vertebral artery is dominant. Basilar artery is patent, with normal flow related enhancement ; motion obscures the main branch vessels. Normal flow related enhancement of the posterior cerebral arteries.  No large vessel occlusion, high-grade stenosis. Motion degrades sensitivity for luminal irregularity or small aneurysm.  MRV HEAD FINDINGS  Normal flow related enhancement superior sagittal sinus and torcula. Nonocclusive focal tubular low signal within the LEFT transverse sinus corresponding to angiographic abnormality. The RIGHT transverse sinus is dominant. Normal flow related enhancement  of the sigmoid sinuses, internal jugular veins. Normal flow related enhancement of the internal cerebral veins though, not tailored for evaluation.  IMPRESSION: Habitus and motion degraded examination.  Normal noncontrast MRI of the brain with aforementioned limitations.  Motion degraded MRA without large vessel occlusion.  Tubular nonocclusive filling defect LEFT transverse sinus concerning for acute thrombosis as seen on prior CT of the head.   Electronically Signed   By: Awilda Metro   On: 06/01/2014 04:41    Assessment: 44 y.o. male presenting with acute left lateral venous sinus thrombosis.  Stroke Risk Factors - diabetes mellitus  Plan: 1. HgbA1c, fasting lipid panel 2. Echocardiogram 3. Prophylactic therapy-Anticoagulation: IV heparin drip; long-term anticoagulation agent to be determined. 4. Hypercoagulable panel 5. Telemetry monitoring  C.R. Roseanne Reno, MD [facility] [phone]  06/01/2014, 5:23 AM

## 2014-06-02 DIAGNOSIS — I8290 Acute embolism and thrombosis of unspecified vein: Secondary | ICD-10-CM

## 2014-06-02 LAB — HEPARIN LEVEL (UNFRACTIONATED): HEPARIN UNFRACTIONATED: 0.4 [IU]/mL (ref 0.30–0.70)

## 2014-06-02 LAB — CBC
HEMATOCRIT: 48 % (ref 39.0–52.0)
HEMOGLOBIN: 15.7 g/dL (ref 13.0–17.0)
MCH: 28.4 pg (ref 26.0–34.0)
MCHC: 32.7 g/dL (ref 30.0–36.0)
MCV: 86.8 fL (ref 78.0–100.0)
PLATELETS: 173 10*3/uL (ref 150–400)
RBC: 5.53 MIL/uL (ref 4.22–5.81)
RDW: 14.9 % (ref 11.5–15.5)
WBC: 9.4 10*3/uL (ref 4.0–10.5)

## 2014-06-02 LAB — HOMOCYSTEINE: HOMOCYSTEINE-NORM: 9 umol/L (ref 0.0–15.0)

## 2014-06-02 LAB — GLUCOSE, CAPILLARY
GLUCOSE-CAPILLARY: 140 mg/dL — AB (ref 65–99)
GLUCOSE-CAPILLARY: 137 mg/dL — AB (ref 65–99)

## 2014-06-02 LAB — TROPONIN I: Troponin I: 0.05 ng/mL — ABNORMAL HIGH (ref <0.031)

## 2014-06-02 MED ORDER — RIVAROXABAN 20 MG PO TABS
20.0000 mg | ORAL_TABLET | Freq: Two times a day (BID) | ORAL | Status: DC
  Administered 2014-06-02: 20 mg via ORAL
  Filled 2014-06-02: qty 1

## 2014-06-02 MED ORDER — ATORVASTATIN CALCIUM 40 MG PO TABS: 40.0000 mg | ORAL_TABLET | Freq: Every day | ORAL | Status: DC

## 2014-06-02 MED ORDER — RIVAROXABAN 15 MG PO TABS
15.0000 mg | ORAL_TABLET | Freq: Every day | ORAL | Status: DC
Start: 2014-06-23 — End: 2016-05-31

## 2014-06-02 MED ORDER — EZETIMIBE 10 MG PO TABS: 10.0000 mg | ORAL_TABLET | Freq: Every day | ORAL | Status: DC

## 2014-06-02 MED ORDER — RIVAROXABAN 15 MG PO TABS: 15.0000 mg | ORAL_TABLET | Freq: Every day | ORAL | Status: DC

## 2014-06-02 MED ORDER — ACETAMINOPHEN 325 MG PO TABS: 650.0000 mg | ORAL_TABLET | ORAL | Status: DC | PRN

## 2014-06-02 MED ORDER — RIVAROXABAN 20 MG PO TABS
20.0000 mg | ORAL_TABLET | Freq: Two times a day (BID) | ORAL | Status: DC
Start: 2014-06-02 — End: 2016-05-31

## 2014-06-02 MED ORDER — METFORMIN HCL 500 MG PO TABS: 500.0000 mg | ORAL_TABLET | Freq: Every day | ORAL | Status: DC

## 2014-06-02 NOTE — Evaluation (Signed)
Occupational Therapy Evaluation Patient Details Name: [name] Holland MRN: [id number] DOB: [birth year] Today's Date: 06/02/2014    History of Present Illness Pt admitted with acute onset numbness involving Lt side of his neck and face associated with exertion.  MRI no acute infarct.  Lt transvers sinus compatible with thrombus which appears acute, but this may be incidental finding as his symptoms are more consistent with coital cephalagia.  PMH includes:  DM; morbid obesity    Clinical Impression   Patient evaluated by Occupational Therapy with no further acute OT needs identified. All education has been completed and the patient has no further questions. Pt is back to baseline with no residual deficits.  He is independent with ADLs and simulated IADLs. See below for any follow-up Occupational Therapy or equipment needs. OT is signing off. Thank you for this referral.      Follow Up Recommendations  No OT follow up    Equipment Recommendations  None recommended by OT    Recommendations for Other Services       Precautions / Restrictions Precautions Precautions: None      Mobility Bed Mobility Overal bed mobility: Independent                Transfers Overall transfer level: Independent                    Balance Overall balance assessment: Independent                                          ADL Overall ADL's : Independent                                       General ADL Comments: Pt showered independently.  Able to gather clothes, move furniture items in room.   No LOB or dizziness      Vision Vision Assessment?: Yes Eye Alignment: Within Functional Limits Alignment/Gaze Preference: Within Defined Limits Tracking/Visual Pursuits: Able to track stimulus in all quads without difficulty Saccades: Within functional limits Visual Fields: No apparent deficits   Perception Perception Perception Tested?: Yes    Praxis Praxis Praxis tested?: Within functional limits    Pertinent Vitals/Pain Pain Assessment: No/denies pain     Hand Dominance Right   Extremity/Trunk Assessment Upper Extremity Assessment Upper Extremity Assessment: Overall WFL for tasks assessed   Lower Extremity Assessment Lower Extremity Assessment: Overall WFL for tasks assessed   Cervical / Trunk Assessment Cervical / Trunk Assessment: Normal   Communication Communication Communication: No difficulties   Cognition Arousal/Alertness: Awake/alert Behavior During Therapy: WFL for tasks assessed/performed Overall Cognitive Status: Within Functional Limits for tasks assessed                     General Comments       Exercises       Shoulder Instructions      Home Living Family/patient expects to be discharged to:: Private residence     Type of Home: House Home Access: Stairs to enter Secretary/administrator of Steps: 3   Home Layout: One level     Bathroom Shower/Tub: Tub/shower unit Shower/tub characteristics: Engineer, building services: Standard     Home Equipment: None          Prior Functioning/Environment Level of Independence: Independent  Comments: 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     OT Diagnosis:     OT Problem List:     OT Treatment/Interventions:      OT Goals(Current goals can be found in the care plan section) Acute Rehab OT Goals OT Goal Formulation: All assessment and education complete, DC therapy  OT Frequency:     Barriers to D/C:            Co-evaluation              End of Session    Activity Tolerance: Patient tolerated treatment well Patient left: in chair;with call bell/phone within reach   Time: 0933-1005 OT Time Calculation (min): 32 min Charges:  OT General Charges $OT Visit: 1 Procedure OT Evaluation $Initial OT Evaluation Tier I: 1 Procedure OT Treatments $Self  Care/Home Management : 8-22 mins G-Codes:    [name] Holland M 06-20-2014, 10:17 AM

## 2014-06-02 NOTE — Progress Notes (Signed)
STROKE TEAM PROGRESS NOTE   HISTORY [name] Holland is an 44 y.o. male history diabetes mellitus and obesity presenting with acute onset of numbness and tingling involving the left side of his neck and face. Onset of symptoms was associated with exertion. He had no visual changes and no changes in speech. He also had no focal motor or sensory changes involving extremities. CT angiogram of head and neck showed findings consistent with a lateral sinus thrombus which appeared to be acute. Findings were also seen on MRV of the head. MRI showed no acute stroke. Patient was started on IV heparin drip and a coagulation. NIH stroke score was 0.  LSN: [id number] on 05/31/2014 tPA Given: No: Minimal subjective sensory symptoms only. mRankin:  SUBJECTIVE (INTERVAL HISTORY) No family members present. Currently on intravenous heparin. Symptoms resolved. [name] in working on the po medication as outpt for d/c preparation.  OBJECTIVE Temp:  [97.7 F (36.5 C)-98.8 F (37.1 C)] 98 F (36.7 C) (05/22 1406) Pulse Rate:  [74-91] 84 (05/22 1406) Cardiac Rhythm:  [-] Normal sinus rhythm (05/22 0751) Resp:  [18-20] 18 (05/22 1406) BP: (114-144)/(45-71) 114/45 mmHg (05/22 1406) SpO2:  [93 %-98 %] 98 % (05/22 1406)   Recent Labs Lab 06/01/14 1126 06/01/14 1614 06/01/14 2157 06/02/14 0629 06/02/14 1135  GLUCAP 178* 165* 208* 140* 137*    Recent Labs Lab 05/31/14 1805  NA 138  K 3.9  CL 100*  GLUCOSE 205*  BUN 9  CREATININE 1.00   No results for input(s): AST, ALT, ALKPHOS, BILITOT, PROT, ALBUMIN in the last 168 hours.  Recent Labs Lab 05/31/14 1805 06/01/14 0754 06/02/14 0352  WBC  --  9.5 9.4  HGB 18.4* 16.1 15.7  HCT 54.0* 48.9 48.0  MCV  --  86.4 86.8  PLT  --  179 173    Recent Labs Lab 06/01/14 0754 06/01/14 1309 06/02/14 1210  TROPONINI <0.03 0.04* 0.05*    Recent Labs  05/31/14 2206  LABPROT 14.5  INR 1.11    Recent Labs  05/31/14 2331  COLORURINE YELLOW   LABSPEC >1.046*  PHURINE 5.5  GLUCOSEU 100*  HGBUR TRACE*  BILIRUBINUR NEGATIVE  KETONESUR NEGATIVE  PROTEINUR 100*  UROBILINOGEN 0.2  NITRITE NEGATIVE  LEUKOCYTESUR NEGATIVE       Component Value Date/Time   CHOL 227* 06/01/2014 0448   TRIG 277* 06/01/2014 0448   HDL 22* 06/01/2014 0448   CHOLHDL 10.3 06/01/2014 0448   VLDL 55* 06/01/2014 0448   LDLCALC 150* 06/01/2014 0448   Lab Results  Component Value Date   HGBA1C  11/09/2007    6.0 (NOTE)   The ADA recommends the following therapeutic goal for glycemic   control related to Hgb A1C measurement:   Goal of Therapy:   < 7.0% Hgb A1C   Reference: American Diabetes Association: Clinical Practice   Recommendations 2008, Diabetes Care,  2008, 31:(Suppl 1).      Component Value Date/Time   LABOPIA NONE DETECTED 05/31/2014 2331   COCAINSCRNUR NONE DETECTED 05/31/2014 2331   LABBENZ NONE DETECTED 05/31/2014 2331   AMPHETMU NONE DETECTED 05/31/2014 2331   THCU NONE DETECTED 05/31/2014 2331   LABBARB NONE DETECTED 05/31/2014 2331    No results for input(s): ETH in the last 168 hours.  Ct Angio Head and Neck W/cm &/or Wo Cm 05/31/2014    Carotid and vertebral arteries are patent bilaterally. Right vertebral artery is small and hypoplastic and difficult to evaluate due to its size  Filling defect  left transverse sinus compatible with thrombus which appears acute.    Dg Chest 2 View 06/01/2014    Hypoventilatory chest without definite acute process.     Mr MRV MRA Brain Wo Contrast 06/01/2014    Habitus and motion degraded examination.  Normal noncontrast MRI of the brain with aforementioned limitations.  Motion degraded MRA without large vessel occlusion.  Tubular nonocclusive filling defect LEFT transverse sinus concerning for acute thrombosis as seen on prior CT of the head.     2-D echocardiogram 06/01/2014 Study Conclusions - Procedure narrative: Transthoracic echocardiography. Image quality was adequate. The study  was technically difficult, as a result of body habitus. - Left ventricle: The cavity size was normal. There was mild concentric hypertrophy. Systolic function was normal. The estimated ejection fraction was in the range of 55% to 60%. Wall motion was normal; there were no regional wall motion abnormalities. Diastolic dysfunction present, indeterminate grade. - Aortic valve: Mildly calcified annulus. - Mitral valve: Mildly calcified annulus.  CUS - Bilateral: 1-39% ICA stenosis. Vertebral artery flow is antegrade.   PHYSICAL EXAM  Temp:  [97.7 F (36.5 C)-98.8 F (37.1 C)] 98 F (36.7 C) (05/22 1406) Pulse Rate:  [74-91] 84 (05/22 1406) Resp:  [18-20] 18 (05/22 1406) BP: (114-144)/(45-71) 114/45 mmHg (05/22 1406) SpO2:  [93 %-98 %] 98 % (05/22 1406)  General - morbid obesity, well developed, in no apparent distress.  Cardiovascular - Regular rate and rhythm with no murmur.  Neck - supple, no bruits  Mental Status -  Level of arousal and orientation to time, place, and person were intact. Language including expression, naming, repetition, comprehension was assessed and found intact Fund of Knowledge was assessed and was intact.  Cranial Nerves II - XII - II - Visual field intact OU. III, IV, VI - Extraocular movements intact. V - Facial sensation intact bilaterally. VII - Facial movement intact bilaterally. VIII - Hearing & vestibular intact bilaterally. X - Palate elevates symmetrically. XI - Chin turning & shoulder shrug intact bilaterally. XII - Tongue protrusion intact.  Motor Strength - The patient's strength was normal in all extremities and pronator drift was absent.  Bulk was normal and fasciculations were absent.   Motor Tone - Muscle tone was assessed at the neck and appendages and was normal.  Reflexes - The patient's reflexes were 1+ in all extremities and he had no pathological reflexes.  Sensory - Light touch, temperature/pinprick, vibration  and proprioception, and Romberg testing were assessed and were symmetrical.    Coordination - The patient had normal movements in the hands and feet with no ataxia or dysmetria.  Tremor was absent.  Gait and Station - The patient's transfers, posture, gait, station, and turns were observed as normal.   ASSESSMENT/PLAN Mr. RAYANSH HERBST is a 44 y.o. male with history of  diabetes mellitus presenting with pain and numbness involving the left side of the face on the left neck. He did not receive IV t-PA due to minimal deficits.  Left transverse sinus compatible with thrombus which appears acute.  May be incidental finding as his symptoms more consistent with coital cephalgia.   MRI  No acute infarct  MRA  Unremarkable  CTV and MRV - concerning for acute transverse venous thrombosis, but chronic thrombosis not able to exclude. Also atypical for subarachnoid granulation.   Carotid Doppler unremarkable  2D Echo unremarkable  LDL 150, not at goal  HgbA1c pending  IV heparin for VTE prophylaxis Diet Carb Modified Fluid consistency:: Thin;  Room service appropriate?: Yes Diet - low sodium heart healthy Diet Carb Modified  no antithrombotic prior to admission, now on IV Heparin. Pt need anticoagulation for 3 months and follow up in clinic to repeat CTA.  Ongoing aggressive stroke risk factor management  Therapy recommendations: Pending  Disposition: Pending  ? Coital headache  Two episodes of HA during sexual intercourse  Not associated with transverse sinus thrombosis  Follow up as outpt to consider beta blocker if recurrent  Hyperlipidemia  Home meds:  No lipid lowering medications prior to admission  LDL 150, goal < 70  Add Lipitor 40  Also add zetia for high TG  Continue statin at discharge  Diabetes  HgbA1c pending, goal < 7.0  Uncontrolled  Morbid obesity  Weight loss recommended  Pt is willing to consider  Other Stroke Risk Factors  Cigarette  smoker, quit smoking years ago   Obesity, Body mass index is 46.54 kg/(m^2).   Other Active Problems    Other Pertinent History    Hospital day # 2  [facility] Pager [phone] 06/02/2014, 3:26 PM  Neurology will sign off. Please call with questions. Pt will follow up with Dr. Roda Shutters at [facility] in about 2 months. Thanks for the consult.  Marvel Plan, MD PhD Stroke Neurology 06/02/2014 3:26 PM   To contact Stroke Continuity provider, please refer to [email or web address]. After hours, contact General Neurology

## 2014-06-02 NOTE — Progress Notes (Signed)
Patient is being d/c home. D/c instruction given and patient verbalized understanding. 

## 2014-06-02 NOTE — Progress Notes (Addendum)
ANTICOAGULATION CONSULT NOTE  Pharmacy Consult for Heparin IV>>Xarelto Indication: L transverse sinus thrombus  No Known Allergies  Patient Measurements: Height: 6\' 1"  (185.4 cm) Weight: (!) 352 lb 11.2 oz (159.984 kg) IBW/kg (Calculated) : 79.9 Heparin Dosing Weight: 118 kg  Vital Signs: Temp: 97.7 F (36.5 C) (05/22 0602) Temp Source: Oral (05/22 0602) BP: 144/49 mmHg (05/22 0602) Pulse Rate: 87 (05/22 0602)  Labs:  Recent Labs  05/31/14 1805 05/31/14 2206  06/01/14 0754 06/01/14 1309 06/01/14 2235 06/02/14 0352  HGB 18.4*  --   --  16.1  --   --  15.7  HCT 54.0*  --   --  48.9  --   --  48.0  PLT  --   --   --  179  --   --  173  APTT  --  80*  --   --   --   --   --   LABPROT  --  14.5  --   --   --   --   --   INR  --  1.11  --   --   --   --   --   HEPARINUNFRC  --   --   < >  --  0.16* 0.42 0.40  CREATININE 1.00  --   --   --   --   --   --   TROPONINI  --   --   --  <0.03 0.04*  --   --   < > = values in this interval not displayed.  Estimated Creatinine Clearance: 150.8 mL/min (by C-G formula based on Cr of 1).  Assessment: 44yoM with PMH DMT2 presenting with L sided neck pain, found on CTA to have L transverse sinus thrombus. Decision made to start IV heparin. Hypercoagulable panel has been sent 5/21- awaiting results. Neuro asked [place] to keep patient on lower end of goal.  Heparin level has been therapeutic x2 on 2550 units/hr after some rate increases. Note IV access was lost this morning and heparin infusion was off for ~2h. Restarted ~1100.  Hgb 15.7, plts 173- stable, no bleeding noted  Now to transition to Xarelto for long term anticoagulation. SCr 1, CrCL >129mL/min.  Goal of Therapy: Heparin level 0.3-0.5 units/ml Monitor platelets by anticoagulation protocol: Yes  Plan: -continue hep gtt at 2550 units/hr until 1200 today. At 1200, STOP heparin and start Xarelto 20mg  PO BIDwc x42 doses, then switch to 15mg  PO qsupper starting 06/23/2014 -daily  CBC for now -f/u hypercoag panel (sent 5/21) -patient will be educated prior to discharge   Rheannon Cerney D. Delany Steury, PharmD, BCPS Clinical Pharmacist Pager: [phone] 06/02/2014 11:01 AM

## 2014-06-02 NOTE — Care Management Note (Signed)
Case Management Note  Patient Details  Name: [name] Holland MRN: [id number] Date of Birth: [birth year]  Subjective/Objective:                   exerted himself felt a sharp pop in his left side of the neck and severe pain followed by tingling in the left side of face Action/Plan:  Discharge planning Expected Discharge Date:  06/02/14               Expected Discharge Plan:  Home/Self Care  In-House Referral:     Discharge planning Services  CM Consult, [facility], [facility], Medication Assistance  Post Acute Care Choice:    Choice offered to:     DME Arranged:    DME Agency:     HH Arranged:    HH Agency:     Status of Service:     Medicare Important Message Given:    Date Medicare IM Given:    Medicare IM give by:    Date Additional Medicare IM Given:    Additional Medicare Important Message give by:     If discussed at [place] of Stay Meetings, dates discussed:    Additional Comments: CM met with pt in room and gave pt free 30 day trial card for Xarelto.  Pt given [place] letter for successive 2 wks of Xarelto and other prescribed medications.  (pt Needs 2 separate prescriptions for his first month of Xarelto).  Pt given [place] pamphlet and verbalizes understanding he will go to the clinic any weekday morning of this week and ask for; AN APPT TO SECURE A PCP; AN APPT WITH A NAVIGATOR TO SECURE INSURANCE; AN APPT FOR FOLLOW UP MEDICAL CARE; HELP FOR REFILLS FOR XARELTO (Pt will need 3 mos. Coverage total).  No other CM needs were communicated.    Dellie Catholic, RN 06/02/2014, 12:12 PM

## 2014-06-02 NOTE — Progress Notes (Signed)
Patient refused wheelchair. Patient left with family. 

## 2014-06-02 NOTE — Discharge Summary (Signed)
Physician Discharge Summary  [name] AVW:[id number] DOB: 07/27/1970 DOA: 05/31/2014  PCP: No PCP Per Patient  Admit date: 05/31/2014 Discharge date: 06/02/2014  Time spent: 35 minutes  Recommendations for Outpatient Follow-up:  1. OSA follow up 2. Outpatient stress test  Discharge Diagnoses:  Active Problems:   CVA (cerebral infarction)   DM type 2 causing neurological disease, not at goal   Cerebral venous sinus thrombosis, acute   Morbid obesity   Coital headache   Discharge Condition: improved  Diet recommendation: cardiac/diabetic  Filed Weights   05/31/14 2134 06/01/14 0200  Weight: 161.027 kg (355 lb) 159.984 kg (352 lb 11.2 oz)    History of present illness:  [name] Holland is a 44 y.o. male   has a past medical history of Diabetes mellitus without complication.   At 4 PM he exerted himself felt a sharp pop in his left side of the neck and severe pain followed by tingling in the left side of face. Deneis any weakness. CT was unremarkable but CTA showed Filling defect left transverse sinus compatible with thrombus which appears acute.   Hospitalist was called for admission for CVA  Hospital Course:  Left transverse sinus compatible with thrombus which appears acute.  MRI No acute infarct  MRA Unremarkable  CTV and MRV - concerning for acute transverse venous thrombosis, but chronic thrombosis not able to exclude. Also atypical for subarachnoid granulation.  Carotid Doppler unremarkable  2D Echo unremarkable  LDL 150, not at goal  HgbA1c 8.9  IV heparin for VTE prophylaxis  Diet Carb Modified Fluid consistency:: Thin; Room service appropriate?: Yes  Diet - low sodium heart healthy  Diet Carb Modified  no antithrombotic prior to admission,-need anticoagulation for 3 months and follow up in clinic to repeat CTA.  Ongoing aggressive stroke risk factor management  ? Coital headache  Two episodes of HA during sexual intercourse  Not  associated with transverse sinus thrombosis  Follow up as outpt to consider beta blocker if recurrent  Hyperlipidemia  Home meds: No lipid lowering medications prior to admission  LDL 150, goal < 70  Add Lipitor 40  Also add zetia for high TG  Continue statin at discharge  Diabetes  HgbA1c, goal < 7.0  Dietary changes  metformin  Morbid obesity  Weight loss recommended  Pt is willing to consider  Other Stroke Risk Factors  Cigarette smoker, quit smoking years ago   Obesity, Body mass index is 46.54 kg/(m^2).   Flat troponin- no cp  Procedures:    Consultations:    Discharge Exam: Filed Vitals:   06/02/14 0602  BP: 144/49  Pulse: 87  Temp: 97.7 F (36.5 C)  Resp: 20      Discharge Instructions   Discharge Instructions    Diet - low sodium heart healthy    Complete by:  As directed      Diet Carb Modified    Complete by:  As directed      Discharge instructions    Complete by:  As directed   xarelto 20 mg BID x 21 days then 15 mg daily for 3 months Return to ER for headache/uncontrolled bleeding     Increase activity slowly    Complete by:  As directed           Current Discharge Medication List    START taking these medications   Details  acetaminophen (TYLENOL) 325 MG tablet Take 2 tablets (650 mg total) by mouth every 4 (four) hours  as needed for mild pain (or temp >/= 99.5 F).    atorvastatin (LIPITOR) 40 MG tablet Take 1 tablet (40 mg total) by mouth daily at 6 PM. Qty: 30 tablet, Refills: 0    ezetimibe (ZETIA) 10 MG tablet Take 1 tablet (10 mg total) by mouth daily. Qty: 30 tablet, Refills: 0    !! Rivaroxaban (XARELTO) 15 MG TABS tablet Take 1 tablet (15 mg total) by mouth daily with supper. Qty: 30 tablet, Refills: 2    !! rivaroxaban (XARELTO) 20 MG TABS tablet Take 1 tablet (20 mg total) by mouth 2 (two) times daily with a meal. Qty: 42 tablet, Refills: 0     !! - Potential duplicate medications found. Please  discuss with provider.    CONTINUE these medications which have CHANGED   Details  metFORMIN (GLUCOPHAGE) 500 MG tablet Take 1 tablet (500 mg total) by mouth daily with breakfast. Qty: 30 tablet, Refills: 1      STOP taking these medications     hydrocortisone 2.5 % lotion        No Known Allergies    The results of significant diagnostics from this hospitalization (including imaging, microbiology, ancillary and laboratory) are listed below for reference.    Significant Diagnostic Studies: Ct Angio Head W/cm &/or Wo Cm  05/31/2014   CLINICAL DATA:  Headache.  Posterior neck pain.  Diabetes.  EXAM: CT ANGIOGRAPHY HEAD AND NECK  TECHNIQUE: Multidetector CT imaging of the head and neck was performed using the standard protocol during bolus administration of intravenous contrast. Multiplanar CT image reconstructions and MIPs were obtained to evaluate the vascular anatomy. Carotid stenosis measurements (when applicable) are obtained utilizing NASCET criteria, using the distal internal carotid diameter as the denominator.  CONTRAST:  OMNIPAQUE IOHEXOL 350 MG/ML SOLN  COMPARISON:  None.  FINDINGS: CT HEAD  Brain: Ventricle size normal. Negative for acute or chronic infarction. Negative for hemorrhage or mass lesion. Normal appearance of the brain.  Calvarium and skull base: Negative  Paranasal sinuses: Negative  Orbits: Negative  Image quality degraded by morbid obesity specially in the neck  CTA NECK  Aortic arch: Limited evaluation of the aortic arch due to patient size. Only the top of the arch was scanned.  Right carotid system: Negative  Left carotid system: Negative  Vertebral arteries:Left vertebral artery dominant. Small right vertebral artery difficult to evaluate due to small size but no evidence of vertebral artery dissection or stenosis bilaterally.  Skeleton: Negative  Other neck: Negative  CTA HEAD  Anterior circulation: Cavernous carotid widely patent bilaterally. Anterior and  middle cerebral arteries normal.  Posterior circulation: Left vertebral artery dominant. Both vertebral arteries contribute to the basilar. PICA patent bilaterally. Basilar widely patent. Superior cerebellar and posterior cerebral arteries patent bilaterally.  Venous sinuses: Filling defect in the left transverse sinus with irregular margins. This measures approximately 2.5 cm in length and appears be within the sinus. This sinus remains patent. This is most likely thrombus. The patient has left posterior neck pain. Right transverse sinus patent. Sagittal sinus patent.  Anatomic variants: Negative for aneurysm  Delayed phase: Normal enhancement  IMPRESSION: Carotid and vertebral arteries are patent bilaterally. 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  Critical Value/emergent results were called by telephone at the time of interpretation on 05/31/2014 at 7:18 pm to [facility] CAMPRUBI-SOMS PA , who verbally acknowledged these results.  Electronically Signed   By: Marlan Palau M.D.   On: 05/31/2014 19:20   Dg Chest 2 View  06/01/2014   CLINICAL DATA:  Shortness of breath.  EXAM: CHEST  2 VIEW  COMPARISON:  11/08/2007  FINDINGS: Lung volumes are low leading to crowding of bronchovascular structures. The cardiomediastinal contours are normal. No consolidation, pleural effusion, or pneumothorax. No acute osseous abnormalities are seen.  IMPRESSION: Hypoventilatory chest without definite acute process.   Electronically Signed   By: Rubye Oaks M.D.   On: 06/01/2014 05:11   Ct Angio Neck W/cm &/or Wo/cm  05/31/2014   CLINICAL DATA:  Headache.  Posterior neck pain.  Diabetes.  EXAM: CT ANGIOGRAPHY HEAD AND NECK  TECHNIQUE: Multidetector CT imaging of the head and neck was performed using the standard protocol during bolus administration of intravenous contrast. Multiplanar CT image  reconstructions and MIPs were obtained to evaluate the vascular anatomy. Carotid stenosis measurements (when applicable) are obtained utilizing NASCET criteria, using the distal internal carotid diameter as the denominator.  CONTRAST:  OMNIPAQUE IOHEXOL 350 MG/ML SOLN  COMPARISON:  None.  FINDINGS: CT HEAD  Brain: Ventricle size normal. Negative for acute or chronic infarction. Negative for hemorrhage or mass lesion. Normal appearance of the brain.  Calvarium and skull base: Negative  Paranasal sinuses: Negative  Orbits: Negative  Image quality degraded by morbid obesity specially in the neck  CTA NECK  Aortic arch: Limited evaluation of the aortic arch due to patient size. Only the top of the arch was scanned.  Right carotid system: Negative  Left carotid system: Negative  Vertebral arteries:Left vertebral artery dominant. Small right vertebral artery difficult to evaluate due to small size but no evidence of vertebral artery dissection or stenosis bilaterally.  Skeleton: Negative  Other neck: Negative  CTA HEAD  Anterior circulation: Cavernous carotid widely patent bilaterally. Anterior and middle cerebral arteries normal.  Posterior circulation: Left vertebral artery dominant. Both vertebral arteries contribute to the basilar. PICA patent bilaterally. Basilar widely patent. Superior cerebellar and posterior cerebral arteries patent bilaterally.  Venous sinuses: Filling defect in the left transverse sinus with irregular margins. This measures approximately 2.5 cm in length and appears be within the sinus. This sinus remains patent. This is most likely thrombus. The patient has left posterior neck pain. Right transverse sinus patent. Sagittal sinus patent.  Anatomic variants: Negative for aneurysm  Delayed phase: Normal enhancement  IMPRESSION: Carotid and vertebral arteries are patent bilaterally. Right vertebral artery is small and hypoplastic and difficult to evaluate due to its size  Filling defect left  transverse sinus compatible with thrombus which appears acute.  Critical Value/emergent results were called by telephone at the time of interpretation on 05/31/2014 at 7:18 pm to [facility] CAMPRUBI-SOMS PA , who verbally acknowledged these results.   Electronically Signed   By: Marlan Palau M.D.   On: 05/31/2014 19:20   Mr Brain Wo Contrast  06/01/2014   CLINICAL DATA:  Larey Seat sharp pop and LEFT neck at 4 p.m. with subsequent severe pain and tingling LEFT face. CT angiogram demonstrating LEFT transverse sinus thrombosis. History of diabetes and stroke.  EXAM: MRI HEAD WITHOUT CONTRAST  MRA HEAD WITHOUT CONTRAST  MRV HEAD WITHOUT CONTRAST  TECHNIQUE: Multiplanar, multiecho pulse sequences of the brain and surrounding structures were obtained without and with intravenous contrast. Angiographic images of the head were obtained using MRA and MRV technique without contrast. MIP images provided.  COMPARISON:  CT angiogram of the head and neck May 31, 2014  FINDINGS: MRI HEAD FINDINGS  Larger body habitus results in overall decreased signal to noise ratio. Mild motion degraded examination. The ventricles and sulci are normal for patient's age. No abnormal parenchymal signal, mass lesions, mass effect. No reduced diffusion to suggest acute ischemia. No susceptibility artifact to suggest hemorrhage.  No abnormal extra-axial fluid collections. No extra-axial masses though, contrast enhanced sequences would be more sensitive. Normal major intracranial vascular flow voids seen at the skull base. No abnormal intrinsic T1 shortening with particular attention to the LEFT transverse sinus.  Ocular globes and orbital contents are unremarkable though not tailored for evaluation. No abnormal sellar expansion. Small LEFT maxillary mucosal retention cyst without paranasal sinus air-fluid levels. No suspicious calvarial bone marrow signal. No abnormal sellar expansion. Craniocervical junction maintained.  MRA HEAD FINDINGS  Larger body  habitus results in overall decreased signal to noise ratio. Moderately motion degraded examination.  Anterior circulation: Normal flow related enhancement of the included cervical, petrous, cavernous and supra clinoid internal carotid arteries. Patent anterior communicating artery. Normal flow related enhancement of the anterior and middle cerebral arteries, including more distal segments.  No large vessel occlusion, high-grade stenosis, abnormal luminal irregularity, aneurysm.  Posterior circulation: LEFT vertebral artery is dominant. Basilar artery is patent, with normal flow related enhancement ; motion obscures the main branch vessels. Normal flow related enhancement of the posterior cerebral arteries.  No large vessel occlusion, high-grade stenosis. Motion degrades sensitivity for luminal irregularity or small aneurysm.  MRV HEAD FINDINGS  Normal flow related enhancement superior sagittal sinus and torcula. Nonocclusive focal tubular low signal within the LEFT transverse sinus corresponding to angiographic abnormality. The RIGHT transverse sinus is dominant. Normal flow related enhancement of the sigmoid sinuses, internal jugular veins. Normal flow related enhancement of the internal cerebral veins though, not tailored for evaluation.  IMPRESSION: Habitus and motion degraded examination.  Normal noncontrast MRI of the brain with aforementioned limitations.  Motion degraded MRA without large vessel occlusion.  Tubular nonocclusive filling defect LEFT transverse sinus concerning for acute thrombosis as seen on prior CT of the head.   Electronically Signed   By: Awilda Metro   On: 06/01/2014 04:41   Mr Maxine Glenn Head/brain Wo Cm  06/01/2014   CLINICAL DATA:  Larey Seat sharp pop and LEFT neck at 4 p.m. with subsequent severe pain and tingling LEFT face. CT angiogram demonstrating LEFT transverse sinus thrombosis. History of diabetes and stroke.  EXAM: MRI HEAD WITHOUT CONTRAST  MRA HEAD WITHOUT CONTRAST  MRV HEAD  WITHOUT CONTRAST  TECHNIQUE: Multiplanar, multiecho pulse sequences of the brain and surrounding structures were obtained without and with intravenous contrast. Angiographic images of the head were obtained using MRA and MRV technique without contrast. MIP images provided.  COMPARISON:  CT angiogram of the head and neck May 31, 2014  FINDINGS: MRI HEAD FINDINGS  Larger body habitus results in overall decreased signal to noise ratio. Mild motion degraded examination. The ventricles and sulci are normal for patient's age. No abnormal parenchymal signal, mass lesions, mass effect. No reduced diffusion to suggest acute ischemia. No susceptibility artifact to suggest hemorrhage.  No abnormal extra-axial fluid collections. No extra-axial masses though, contrast enhanced sequences would be more sensitive. Normal major intracranial vascular flow voids seen at the skull base. No abnormal intrinsic T1 shortening with particular attention to the LEFT transverse sinus.  Ocular globes and orbital contents are unremarkable though not tailored for evaluation. No abnormal sellar expansion. Small LEFT maxillary mucosal retention cyst without paranasal sinus air-fluid  levels. No suspicious calvarial bone marrow signal. No abnormal sellar expansion. Craniocervical junction maintained.  MRA HEAD FINDINGS  Larger body habitus results in overall decreased signal to noise ratio. Moderately motion degraded examination.  Anterior circulation: Normal flow related enhancement of the included cervical, petrous, cavernous and supra clinoid internal carotid arteries. Patent anterior communicating artery. Normal flow related enhancement of the anterior and middle cerebral arteries, including more distal segments.  No large vessel occlusion, high-grade stenosis, abnormal luminal irregularity, aneurysm.  Posterior circulation: LEFT vertebral artery is dominant. Basilar artery is patent, with normal flow related enhancement ; motion obscures the  main branch vessels. Normal flow related enhancement of the posterior cerebral arteries.  No large vessel occlusion, high-grade stenosis. Motion degrades sensitivity for luminal irregularity or small aneurysm.  MRV HEAD FINDINGS  Normal flow related enhancement superior sagittal sinus and torcula. Nonocclusive focal tubular low signal within the LEFT transverse sinus corresponding to angiographic abnormality. The RIGHT transverse sinus is dominant. Normal flow related enhancement of the sigmoid sinuses, internal jugular veins. Normal flow related enhancement of the internal cerebral veins though, not tailored for evaluation.  IMPRESSION: Habitus and motion degraded examination.  Normal noncontrast MRI of the brain with aforementioned limitations.  Motion degraded MRA without large vessel occlusion.  Tubular nonocclusive filling defect LEFT transverse sinus concerning for acute thrombosis as seen on prior CT of the head.   Electronically Signed   By: Awilda Metro   On: 06/01/2014 04:41   Mr Mrv Head Wo Cm  06/01/2014   CLINICAL DATA:  Larey Seat sharp pop and LEFT neck at 4 p.m. with subsequent severe pain and tingling LEFT face. CT angiogram demonstrating LEFT transverse sinus thrombosis. History of diabetes and stroke.  EXAM: MRI HEAD WITHOUT CONTRAST  MRA HEAD WITHOUT CONTRAST  MRV HEAD WITHOUT CONTRAST  TECHNIQUE: Multiplanar, multiecho pulse sequences of the brain and surrounding structures were obtained without and with intravenous contrast. Angiographic images of the head were obtained using MRA and MRV technique without contrast. MIP images provided.  COMPARISON:  CT angiogram of the head and neck May 31, 2014  FINDINGS: MRI HEAD FINDINGS  Larger body habitus results in overall decreased signal to noise ratio. Mild motion degraded examination. The ventricles and sulci are normal for patient's age. No abnormal parenchymal signal, mass lesions, mass effect. No reduced diffusion to suggest acute ischemia. No  susceptibility artifact to suggest hemorrhage.  No abnormal extra-axial fluid collections. No extra-axial masses though, contrast enhanced sequences would be more sensitive. Normal major intracranial vascular flow voids seen at the skull base. No abnormal intrinsic T1 shortening with particular attention to the LEFT transverse sinus.  Ocular globes and orbital contents are unremarkable though not tailored for evaluation. No abnormal sellar expansion. Small LEFT maxillary mucosal retention cyst without paranasal sinus air-fluid levels. No suspicious calvarial bone marrow signal. No abnormal sellar expansion. Craniocervical junction maintained.  MRA HEAD FINDINGS  Larger body habitus results in overall decreased signal to noise ratio. Moderately motion degraded examination.  Anterior circulation: Normal flow related enhancement of the included cervical, petrous, cavernous and supra clinoid internal carotid arteries. Patent anterior communicating artery. Normal flow related enhancement of the anterior and middle cerebral arteries, including more distal segments.  No large vessel occlusion, high-grade stenosis, abnormal luminal irregularity, aneurysm.  Posterior circulation: LEFT vertebral artery is dominant. Basilar artery is patent, with normal flow related enhancement ; motion obscures the main branch vessels. Normal flow related enhancement of the posterior cerebral arteries.  No large vessel  occlusion, high-grade stenosis. Motion degrades sensitivity for luminal irregularity or small aneurysm.  MRV HEAD FINDINGS  Normal flow related enhancement superior sagittal sinus and torcula. Nonocclusive focal tubular low signal within the LEFT transverse sinus corresponding to angiographic abnormality. The RIGHT transverse sinus is dominant. Normal flow related enhancement of the sigmoid sinuses, internal jugular veins. Normal flow related enhancement of the internal cerebral veins though, not tailored for evaluation.   IMPRESSION: Habitus and motion degraded examination.  Normal noncontrast MRI of the brain with aforementioned limitations.  Motion degraded MRA without large vessel occlusion.  Tubular nonocclusive filling defect LEFT transverse sinus concerning for acute thrombosis as seen on prior CT of the head.   Electronically Signed   By: Awilda Metro   On: 06/01/2014 04:41    Microbiology: No results found for this or any previous visit (from the past 240 hour(s)).   Labs: Basic Metabolic Panel:  Recent Labs Lab 05/31/14 1805  NA 138  K 3.9  CL 100*  GLUCOSE 205*  BUN 9  CREATININE 1.00   Liver Function Tests: No results for input(s): AST, ALT, ALKPHOS, BILITOT, PROT, ALBUMIN in the last 168 hours. No results for input(s): LIPASE, AMYLASE in the last 168 hours. No results for input(s): AMMONIA in the last 168 hours. CBC:  Recent Labs Lab 05/31/14 1805 06/01/14 0754 06/02/14 0352  WBC  --  9.5 9.4  HGB 18.4* 16.1 15.7  HCT 54.0* 48.9 48.0  MCV  --  86.4 86.8  PLT  --  179 173   Cardiac Enzymes:  Recent Labs Lab 06/01/14 0754 06/01/14 1309  TROPONINI <0.03 0.04*   BNP: BNP (last 3 results) No results for input(s): BNP in the last 8760 hours.  ProBNP (last 3 results) No results for input(s): PROBNP in the last 8760 hours.  CBG:  Recent Labs Lab 06/01/14 1126 06/01/14 1614 06/01/14 2157 06/02/14 0629 06/02/14 1135  GLUCAP 178* 165* 208* 140* 137*       Signed:  Lollie Gunner  [facility] 06/02/2014, 12:09 PM

## 2014-06-02 NOTE — Progress Notes (Signed)
PT Cancellation Note  Patient Details Name: [name] Holland MRN: [id number] DOB: [birth year]   Cancelled Treatment:    Reason Eval/Treat Not Completed: PT screened, no needs identified, will sign off   Van Clines, PT  Acute Rehabilitation Services Pager [phone] Office [phone]    Van Clines [facility] 06/02/2014, 10:45 AM

## 2014-06-03 DIAGNOSIS — I8290 Acute embolism and thrombosis of unspecified vein: Secondary | ICD-10-CM | POA: Insufficient documentation

## 2014-06-03 LAB — HEMOGLOBIN A1C
HEMOGLOBIN A1C: 8.9 % — AB (ref 4.8–5.6)
MEAN PLASMA GLUCOSE: 209 mg/dL

## 2014-06-04 LAB — LUPUS ANTICOAGULANT PANEL
DRVVT: 32.4 s (ref 0.0–55.1)
PTT Lupus Anticoagulant: 47.5 s (ref 0.0–50.0)

## 2014-06-04 LAB — PROTEIN C, TOTAL: PROTEIN C, TOTAL: 89 % (ref 70–140)

## 2014-06-04 LAB — PROTEIN S ACTIVITY: Protein S Activity: 128 % (ref 60–145)

## 2014-06-04 LAB — PROTEIN C ACTIVITY: PROTEIN C ACTIVITY: 140 % (ref 74–151)

## 2014-06-04 LAB — PROTEIN S, TOTAL: Protein S Ag, Total: 103 % (ref 58–150)

## 2014-06-05 ENCOUNTER — Inpatient Hospital Stay: Payer: Self-pay | Admitting: Family Medicine

## 2014-06-05 LAB — PROTHROMBIN GENE MUTATION

## 2014-06-05 LAB — FACTOR 5 LEIDEN

## 2014-07-20 ENCOUNTER — Encounter (HOSPITAL_COMMUNITY): Payer: Self-pay | Admitting: Emergency Medicine

## 2014-07-20 ENCOUNTER — Emergency Department (HOSPITAL_COMMUNITY)
Admission: EM | Admit: 2014-07-20 | Discharge: 2014-07-20 | Disposition: A | Discharge status: Home / Self Care | Payer: 59 | Attending: Emergency Medicine | Admitting: Emergency Medicine

## 2014-07-20 DIAGNOSIS — E119 Type 2 diabetes mellitus without complications: Secondary | ICD-10-CM | POA: Insufficient documentation

## 2014-07-20 DIAGNOSIS — Z87891 Personal history of nicotine dependence: Secondary | ICD-10-CM | POA: Insufficient documentation

## 2014-07-20 DIAGNOSIS — Z7901 Long term (current) use of anticoagulants: Secondary | ICD-10-CM | POA: Insufficient documentation

## 2014-07-20 DIAGNOSIS — M5432 Sciatica, left side: Secondary | ICD-10-CM

## 2014-07-20 DIAGNOSIS — Z79899 Other long term (current) drug therapy: Secondary | ICD-10-CM | POA: Diagnosis not present

## 2014-07-20 DIAGNOSIS — M545 Low back pain: Secondary | ICD-10-CM | POA: Diagnosis present

## 2014-07-20 MED ORDER — METHOCARBAMOL 500 MG PO TABS: 500.0000 mg | ORAL_TABLET | Freq: Two times a day (BID) | ORAL | Status: DC

## 2014-07-20 MED ORDER — HYDROCODONE-ACETAMINOPHEN 5-325 MG PO TABS: 1.0000 | ORAL_TABLET | Freq: Four times a day (QID) | ORAL | Status: DC | PRN

## 2014-07-20 MED ORDER — HYDROMORPHONE HCL 2 MG/ML IJ SOLN
2.0000 mg | Freq: Once | INTRAMUSCULAR | Status: AC
  Administered 2014-07-20: 2 mg via INTRAMUSCULAR
  Filled 2014-07-20: qty 1

## 2014-07-20 MED ORDER — PREDNISONE 20 MG PO TABS
60.0000 mg | ORAL_TABLET | Freq: Once | ORAL | Status: AC
  Administered 2014-07-20: 60 mg via ORAL
  Filled 2014-07-20: qty 3

## 2014-07-20 MED ORDER — PREDNISONE 20 MG PO TABS: ORAL_TABLET | ORAL | Status: DC

## 2014-07-20 MED ORDER — CYCLOBENZAPRINE HCL 10 MG PO TABS
10.0000 mg | ORAL_TABLET | Freq: Once | ORAL | Status: AC
  Administered 2014-07-20: 10 mg via ORAL
  Filled 2014-07-20: qty 1

## 2014-07-20 MED ORDER — KETOROLAC TROMETHAMINE 60 MG/2ML IM SOLN
60.0000 mg | Freq: Once | INTRAMUSCULAR | Status: AC
  Administered 2014-07-20: 60 mg via INTRAMUSCULAR
  Filled 2014-07-20: qty 2

## 2014-07-20 NOTE — ED Notes (Signed)
Pt reports pain is decreased to 4-5 out of 10 and is only bothersome when he moves.  No acute distress noted and no additional needs at this time. Wife at the bedside.

## 2014-07-20 NOTE — ED Notes (Signed)
Brought in by EMS from home with c/o back pain.  Pt reports that he has been having progressive pain to lower back radiating down to left leg, onset after working out in the gym this morning.  Hx of lumbar "bulging disk".

## 2014-07-20 NOTE — ED Notes (Signed)
Bed: GB02 Expected date:  Expected time:  Means of arrival:  Comments: 26M Back pain radiation down L leg

## 2014-07-20 NOTE — ED Notes (Signed)
Ambulated pt in hallway around both nursing stations; he reports pain is still 5/10 but tolerable.  Pt mobility improved throughout the ambulation and he states that he feels like he is ready for discharge.  MD made aware.

## 2014-07-20 NOTE — ED Provider Notes (Signed)
CSN: [id number]     Arrival date & time 07/20/14  2013 History   First MD Initiated Contact with Patient 07/20/14 2023     Chief Complaint  Patient presents with  . Back Pain     (Consider location/radiation/quality/duration/timing/severity/associated sxs/prior Treatment) Patient is a 44 y.o. male presenting with back pain. The history is provided by the patient. No language interpreter was used.  Back Pain Location:  Lumbar spine Quality:  Aching Radiates to:  L posterior upper leg Pain severity:  Severe Pain is:  Same all the time Onset quality:  Gradual Duration:  1 day Timing:  Constant Progression:  Worsening Chronicity:  New Relieved by:  Lying down and being still Worsened by:  Movement and standing Associated symptoms: no abdominal pain, no bowel incontinence, no chest pain, no dysuria, no fever, no headaches, no numbness, no paresthesias, no tingling and no weakness     Past Medical History  Diagnosis Date  . Diabetes mellitus without complication    Past Surgical History  Procedure Laterality Date  . Knee arthroscopy     Family History  Problem Relation Age of Onset  . Other Mother   . Diabetes type II Mother   . CAD Father    History  Substance Use Topics  . Smoking status: Former Smoker -- 0.00 packs/day  . Smokeless tobacco: Not on file  . Alcohol Use: No    Review of Systems  Constitutional: Negative for fever, activity change, appetite change and fatigue.  HENT: Negative for congestion, facial swelling, rhinorrhea and trouble swallowing.   Eyes: Negative for photophobia and pain.  Respiratory: Negative for cough, chest tightness and shortness of breath.   Cardiovascular: Negative for chest pain and leg swelling.  Gastrointestinal: Negative for nausea, vomiting, abdominal pain, diarrhea, constipation and bowel incontinence.  Endocrine: Negative for polydipsia and polyuria.  Genitourinary: Negative for dysuria, urgency, decreased urine volume and  difficulty urinating.  Musculoskeletal: Positive for back pain. Negative for gait problem.  Skin: Negative for color change, rash and wound.  Allergic/Immunologic: Negative for immunocompromised state.  Neurological: Negative for dizziness, tingling, facial asymmetry, speech difficulty, weakness, numbness, headaches and paresthesias.  Psychiatric/Behavioral: Negative for confusion, decreased concentration and agitation.      Allergies  Review of patient's allergies indicates no known allergies.  Home Medications   Prior to Admission medications   Medication Sig Start Date End Date Taking? Authorizing Provider  acetaminophen (TYLENOL) 325 MG tablet Take 2 tablets (650 mg total) by mouth every 4 (four) hours as needed for mild pain (or temp >/= 99.5 F). 06/02/14  Yes Jessica U Vann, DO  atorvastatin (LIPITOR) 40 MG tablet Take 1 tablet (40 mg total) by mouth daily at 6 PM. 06/02/14  Yes Joseph Art, DO  ezetimibe (ZETIA) 10 MG tablet Take 1 tablet (10 mg total) by mouth daily. 06/02/14  Yes Joseph Art, DO  metFORMIN (GLUCOPHAGE) 500 MG tablet Take 1 tablet (500 mg total) by mouth daily with breakfast. 06/02/14  Yes Joseph Art, DO  Rivaroxaban (XARELTO) 15 MG TABS tablet Take 1 tablet (15 mg total) by mouth daily with supper. Patient taking differently: Take 15 mg by mouth daily.  06/23/14  Yes Joseph Art, DO  HYDROcodone-acetaminophen (NORCO) 5-325 MG per tablet Take 1 tablet by mouth every 6 (six) hours as needed. 07/20/14   Toy Cookey, MD  methocarbamol (ROBAXIN) 500 MG tablet Take 1 tablet (500 mg total) by mouth 2 (two) times daily. 07/20/14  Toy Cookey, MD  predniSONE (DELTASONE) 20 MG tablet 3 tabs po day one, then 2 po daily x 4 days 07/20/14   Toy Cookey, MD  rivaroxaban (XARELTO) 20 MG TABS tablet Take 1 tablet (20 mg total) by mouth 2 (two) times daily with a meal. Patient not taking: Reported on 07/20/2014 06/02/14   Shanda Bumps U Vann, DO   BP 121/56 mmHg  Pulse 77   Temp(Src) 98.9 F (37.2 C) (Oral)  Resp 20  SpO2 97% Physical Exam  Constitutional: He is oriented to person, place, and time. He appears well-developed and well-nourished. No distress.  HENT:  Head: Normocephalic and atraumatic.  Mouth/Throat: No oropharyngeal exudate.  Eyes: Pupils are equal, round, and reactive to light.  Neck: Normal range of motion. Neck supple.  Cardiovascular: Normal rate, regular rhythm and normal heart sounds.  Exam reveals no gallop and no friction rub.   No murmur heard. Pulmonary/Chest: Effort normal and breath sounds normal. No respiratory distress. He has no wheezes. He has no rales.  Abdominal: Soft. Bowel sounds are normal. He exhibits no distension and no mass. There is no tenderness. There is no rebound and no guarding.  Musculoskeletal: Normal range of motion. He exhibits no edema or tenderness.       Back:  Neurological: He is alert and oriented to person, place, and time. He has normal strength. No cranial nerve deficit or sensory deficit. Gait normal. GCS eye subscore is 4. GCS verbal subscore is 5. GCS motor subscore is 6.  + L straight leg raise  Skin: Skin is warm and dry.  Psychiatric: He has a normal mood and affect.    ED Course  Procedures (including critical care time) Labs Review Labs Reviewed - No data to display  Imaging Review No results found.   EKG Interpretation None      MDM   Final diagnoses:  Sciatica neuralgia, left    Pt is a 44 y.o. male with Pmhx as above who presents with L lower back pain w/ radiation to posterior L leg, which was gradual onset, worsening after working out this week. No falls, no numbness, weakness, incontinence, or fevers. 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 On PE, VSS, pt in NAD. Cardiopulm exam is benign. Neuro exam is benign. +L sided straight leg raise. Suspect L sided sciatica. Pt treated with IM narcotics, NSAIDS, PO flexeril, prednisone in dept with improvement, was able ot ambulate  around dept. Will d/c home with outpt f/u for ortho with rx for short course of PO pred, norco. Muscle relaxer's.     Rosalio Loud Walz evaluation in the Emergency Department is complete. It has been determined that no acute conditions requiring further emergency intervention are present at this time. The patient/guardian have been advised of the diagnosis and plan. We have discussed signs and symptoms that warrant return to the ED, such as changes or worsening in symptoms, worsening pain, numbness, weakness, incontinence.       Toy Cookey, MD 07/21/14 1158

## 2014-07-20 NOTE — ED Notes (Addendum)
Pt presents with left lower back pain rated 10/10, burning and radiating down left leg since this morning.  He has hx of bulging disc as well as blood clot in his head, for which he is being treated with blood thinners.  He has taken Tylenol and naproxen for pain today with minimal relief.  He has normal sensation in his left leg but is unable to tolerate weight bearing activity at this time.  Occasional tingling in his thigh but no numbness noted.  Pulses +2 down entire left leg, skin warm and dry.  No swelling or discoloration noted. Pt reports history of cocaine addiction and is concerned that whatever meds he is treated with today could cause a relapse.  He has already spoken with his sponsor today and is well supported in this effort.

## 2014-07-20 NOTE — Discharge Instructions (Signed)
Sciatica Sciatica is pain, weakness, numbness, or tingling along the path of the sciatic nerve. The nerve starts in the lower back and runs down the back of each leg. The nerve controls the muscles in the lower leg and in the back of the knee, while also providing sensation to the back of the thigh, lower leg, and the sole of your foot. Sciatica is a symptom of another medical condition. For instance, nerve damage or certain conditions, such as a herniated disk or bone spur on the spine, pinch or put pressure on the sciatic nerve. This causes the pain, weakness, or other sensations normally associated with sciatica. Generally, sciatica only affects one side of the body. CAUSES   Herniated or slipped disc.  Degenerative disk disease.  A pain disorder involving the narrow muscle in the buttocks (piriformis syndrome).  Pelvic injury or fracture.  Pregnancy.  Tumor (rare). SYMPTOMS  Symptoms can vary from mild to very severe. The symptoms usually travel from the low back to the buttocks and down the back of the leg. Symptoms can include:  Mild tingling or dull aches in the lower back, leg, or hip.  Numbness in the back of the calf or sole of the foot.  Burning sensations in the lower back, leg, or hip.  Sharp pains in the lower back, leg, or hip.  Leg weakness.  Severe back pain inhibiting movement. These symptoms may get worse with coughing, sneezing, laughing, or prolonged sitting or standing. Also, being overweight may worsen symptoms. DIAGNOSIS  Your caregiver will perform a physical exam to look for common symptoms of sciatica. He or she may ask you to do certain movements or activities that would trigger sciatic nerve pain. Other tests may be performed to find the cause of the sciatica. These may include:  Blood tests.  X-rays.  Imaging tests, such as an MRI or CT scan. TREATMENT  Treatment is directed at the cause of the sciatic pain. Sometimes, treatment is not necessary  and the pain and discomfort goes away on its own. If treatment is needed, your caregiver may suggest:  Over-the-counter medicines to relieve pain.  Prescription medicines, such as anti-inflammatory medicine, muscle relaxants, or narcotics.  Applying heat or ice to the painful area.  Steroid injections to lessen pain, irritation, and inflammation around the nerve.  Reducing activity during periods of pain.  Exercising and stretching to strengthen your abdomen and improve flexibility of your spine. Your caregiver may suggest losing weight if the extra weight makes the back pain worse.  Physical therapy.  Surgery to eliminate what is pressing or pinching the nerve, such as a bone spur or part of a herniated disk. HOME CARE INSTRUCTIONS   Only take over-the-counter or prescription medicines for pain or discomfort as directed by your caregiver.  Apply ice to the affected area for 20 minutes, 3-4 times a day for the first 48-72 hours. Then try heat in the same way.  Exercise, stretch, or perform your usual activities if these do not aggravate your pain.  Attend physical therapy sessions as directed by your caregiver.  Keep all follow-up appointments as directed by your caregiver.  Do not wear high heels or shoes that do not provide proper support.  Check your mattress to see if it is too soft. A firm mattress may lessen your pain and discomfort. SEEK IMMEDIATE MEDICAL CARE IF:   You lose control of your bowel or bladder (incontinence).  You have increasing weakness in the lower back, pelvis, buttocks,   or legs.  You have redness or swelling of your back.  You have a burning sensation when you urinate.  You have pain that gets worse when you lie down or awakens you at night.  Your pain is worse than you have experienced in the past.  Your pain is lasting longer than 4 weeks.  You are suddenly losing weight without reason. MAKE SURE YOU:  Understand these  instructions.  Will watch your condition.  Will get help right away if you are not doing well or get worse. Document Released: 12/22/2000 Document Revised: 06/29/2011 Document Reviewed: 05/09/2011 [employer] Patient Information 2015 [employer]. This information is not intended to replace advice given to you by your health care provider. Make sure you discuss any questions you have with your health care provider.  

## 2014-10-09 ENCOUNTER — Other Ambulatory Visit: Payer: Self-pay | Admitting: Nurse Practitioner

## 2014-10-09 ENCOUNTER — Ambulatory Visit
Admission: RE | Admit: 2014-10-09 | Discharge: 2014-10-09 | Disposition: A | Discharge status: Home / Self Care | Payer: 59 | Source: Ambulatory Visit | Attending: Internal Medicine | Admitting: Internal Medicine

## 2014-10-09 DIAGNOSIS — M25562 Pain in left knee: Secondary | ICD-10-CM

## 2015-04-21 DIAGNOSIS — F1411 Cocaine abuse, in remission: Secondary | ICD-10-CM | POA: Insufficient documentation

## 2015-04-21 DIAGNOSIS — M170 Bilateral primary osteoarthritis of knee: Secondary | ICD-10-CM | POA: Insufficient documentation

## 2016-02-17 DIAGNOSIS — S73191A Other sprain of right hip, initial encounter: Secondary | ICD-10-CM | POA: Insufficient documentation

## 2016-05-11 DIAGNOSIS — I82409 Acute embolism and thrombosis of unspecified deep veins of unspecified lower extremity: Secondary | ICD-10-CM

## 2016-05-11 HISTORY — DX: Acute embolism and thrombosis of unspecified deep veins of unspecified lower extremity: I82.409

## 2016-05-31 ENCOUNTER — Emergency Department (HOSPITAL_COMMUNITY): Payer: BLUE CROSS/BLUE SHIELD

## 2016-05-31 ENCOUNTER — Observation Stay (HOSPITAL_COMMUNITY)
Admission: EM | Admit: 2016-05-31 | Discharge: 2016-06-02 | Disposition: A | Discharge status: Home / Self Care | Payer: BLUE CROSS/BLUE SHIELD | Attending: Family Medicine | Admitting: Family Medicine

## 2016-05-31 ENCOUNTER — Encounter (HOSPITAL_COMMUNITY): Payer: Self-pay | Admitting: Emergency Medicine

## 2016-05-31 DIAGNOSIS — E1149 Type 2 diabetes mellitus with other diabetic neurological complication: Secondary | ICD-10-CM

## 2016-05-31 DIAGNOSIS — I82412 Acute embolism and thrombosis of left femoral vein: Secondary | ICD-10-CM | POA: Diagnosis not present

## 2016-05-31 DIAGNOSIS — Z8673 Personal history of transient ischemic attack (TIA), and cerebral infarction without residual deficits: Secondary | ICD-10-CM | POA: Insufficient documentation

## 2016-05-31 DIAGNOSIS — G459 Transient cerebral ischemic attack, unspecified: Secondary | ICD-10-CM

## 2016-05-31 DIAGNOSIS — I82419 Acute embolism and thrombosis of unspecified femoral vein: Secondary | ICD-10-CM

## 2016-05-31 DIAGNOSIS — Z79899 Other long term (current) drug therapy: Secondary | ICD-10-CM | POA: Insufficient documentation

## 2016-05-31 DIAGNOSIS — E785 Hyperlipidemia, unspecified: Secondary | ICD-10-CM | POA: Diagnosis not present

## 2016-05-31 DIAGNOSIS — Z7984 Long term (current) use of oral hypoglycemic drugs: Secondary | ICD-10-CM | POA: Diagnosis not present

## 2016-05-31 DIAGNOSIS — Z6841 Body Mass Index (BMI) 40.0 and over, adult: Secondary | ICD-10-CM | POA: Diagnosis not present

## 2016-05-31 DIAGNOSIS — Z87891 Personal history of nicotine dependence: Secondary | ICD-10-CM | POA: Diagnosis not present

## 2016-05-31 DIAGNOSIS — R200 Anesthesia of skin: Secondary | ICD-10-CM | POA: Diagnosis not present

## 2016-05-31 DIAGNOSIS — R51 Headache: Secondary | ICD-10-CM | POA: Diagnosis not present

## 2016-05-31 DIAGNOSIS — E782 Mixed hyperlipidemia: Secondary | ICD-10-CM | POA: Diagnosis present

## 2016-05-31 DIAGNOSIS — R519 Headache, unspecified: Secondary | ICD-10-CM

## 2016-05-31 HISTORY — DX: Cerebral infarction, unspecified: I63.9

## 2016-05-31 LAB — I-STAT CHEM 8, ED
BUN: 15 mg/dL (ref 6–20)
CALCIUM ION: 1.17 mmol/L (ref 1.15–1.40)
CREATININE: 0.8 mg/dL (ref 0.61–1.24)
Chloride: 101 mmol/L (ref 101–111)
GLUCOSE: 127 mg/dL — AB (ref 65–99)
HCT: 43 % (ref 39.0–52.0)
HEMOGLOBIN: 14.6 g/dL (ref 13.0–17.0)
POTASSIUM: 3.8 mmol/L (ref 3.5–5.1)
Sodium: 139 mmol/L (ref 135–145)
TCO2: 28 mmol/L (ref 0–100)

## 2016-05-31 LAB — DIFFERENTIAL
Basophils Absolute: 0 10*3/uL (ref 0.0–0.1)
Basophils Relative: 0 %
EOS PCT: 4 %
Eosinophils Absolute: 0.3 10*3/uL (ref 0.0–0.7)
LYMPHS PCT: 33 %
Lymphs Abs: 3.1 10*3/uL (ref 0.7–4.0)
MONO ABS: 0.5 10*3/uL (ref 0.1–1.0)
MONOS PCT: 6 %
NEUTROS ABS: 5.3 10*3/uL (ref 1.7–7.7)
Neutrophils Relative %: 57 %

## 2016-05-31 LAB — COMPREHENSIVE METABOLIC PANEL
ALK PHOS: 73 U/L (ref 38–126)
ALT: 38 U/L (ref 17–63)
AST: 30 U/L (ref 15–41)
Albumin: 4.1 g/dL (ref 3.5–5.0)
Anion gap: 9 (ref 5–15)
BUN: 13 mg/dL (ref 6–20)
CALCIUM: 9 mg/dL (ref 8.9–10.3)
CO2: 26 mmol/L (ref 22–32)
Chloride: 101 mmol/L (ref 101–111)
Creatinine, Ser: 0.92 mg/dL (ref 0.61–1.24)
Glucose, Bld: 127 mg/dL — ABNORMAL HIGH (ref 65–99)
Potassium: 3.6 mmol/L (ref 3.5–5.1)
Sodium: 136 mmol/L (ref 135–145)
Total Bilirubin: 0.6 mg/dL (ref 0.3–1.2)
Total Protein: 7.2 g/dL (ref 6.5–8.1)

## 2016-05-31 LAB — CBC
HEMATOCRIT: 43.8 % (ref 39.0–52.0)
Hemoglobin: 14.8 g/dL (ref 13.0–17.0)
MCH: 29.4 pg (ref 26.0–34.0)
MCHC: 33.8 g/dL (ref 30.0–36.0)
MCV: 86.9 fL (ref 78.0–100.0)
Platelets: 168 10*3/uL (ref 150–400)
RBC: 5.04 MIL/uL (ref 4.22–5.81)
RDW: 13.7 % (ref 11.5–15.5)
WBC: 9.2 10*3/uL (ref 4.0–10.5)

## 2016-05-31 LAB — CBG MONITORING, ED: Glucose-Capillary: 128 mg/dL — ABNORMAL HIGH (ref 65–99)

## 2016-05-31 LAB — I-STAT TROPONIN, ED: TROPONIN I, POC: 0 ng/mL (ref 0.00–0.08)

## 2016-05-31 LAB — PROTIME-INR
INR: 0.98
Prothrombin Time: 13 seconds (ref 11.4–15.2)

## 2016-05-31 LAB — APTT: aPTT: 26 seconds (ref 24–36)

## 2016-05-31 MED ORDER — ACETAMINOPHEN 650 MG RE SUPP: 650.0000 mg | RECTAL | Status: DC | PRN

## 2016-05-31 MED ORDER — ACETAMINOPHEN 325 MG PO TABS: 650.0000 mg | ORAL_TABLET | ORAL | Status: DC | PRN

## 2016-05-31 MED ORDER — METOCLOPRAMIDE HCL 5 MG/ML IJ SOLN
10.0000 mg | Freq: Once | INTRAMUSCULAR | Status: AC
  Administered 2016-06-01: 10 mg via INTRAVENOUS
  Filled 2016-05-31: qty 2

## 2016-05-31 MED ORDER — SENNOSIDES-DOCUSATE SODIUM 8.6-50 MG PO TABS: 1.0000 | ORAL_TABLET | Freq: Every evening | ORAL | Status: DC | PRN

## 2016-05-31 MED ORDER — EZETIMIBE 10 MG PO TABS
10.0000 mg | ORAL_TABLET | Freq: Every day | ORAL | Status: DC
  Administered 2016-06-01 – 2016-06-02 (×2): 10 mg via ORAL
  Filled 2016-05-31 (×2): qty 1

## 2016-05-31 MED ORDER — ASPIRIN 325 MG PO TABS
325.0000 mg | ORAL_TABLET | Freq: Every day | ORAL | Status: DC
  Administered 2016-06-01: 325 mg via ORAL
  Filled 2016-05-31: qty 1

## 2016-05-31 MED ORDER — SIMVASTATIN 20 MG PO TABS
20.0000 mg | ORAL_TABLET | Freq: Every day | ORAL | Status: DC
  Administered 2016-06-01: 20 mg via ORAL
  Filled 2016-05-31: qty 1

## 2016-05-31 MED ORDER — LORAZEPAM 2 MG/ML IJ SOLN
1.0000 mg | Freq: Once | INTRAMUSCULAR | Status: AC
  Administered 2016-05-31: 1 mg via INTRAVENOUS
  Filled 2016-05-31: qty 1

## 2016-05-31 MED ORDER — STROKE: EARLY STAGES OF RECOVERY BOOK
Freq: Once | Status: AC
  Administered 2016-06-01: 05:00:00
  Filled 2016-05-31: qty 1

## 2016-05-31 MED ORDER — INSULIN ASPART 100 UNIT/ML ~~LOC~~ SOLN: 0.0000 [IU] | Freq: Three times a day (TID) | SUBCUTANEOUS | Status: DC

## 2016-05-31 MED ORDER — ENOXAPARIN SODIUM 40 MG/0.4ML ~~LOC~~ SOLN: 40.0000 mg | Freq: Every day | SUBCUTANEOUS | Status: DC

## 2016-05-31 MED ORDER — ACETAMINOPHEN 160 MG/5ML PO SOLN: 650.0000 mg | ORAL | Status: DC | PRN

## 2016-05-31 MED ORDER — SODIUM CHLORIDE 0.9 % IV SOLN
INTRAVENOUS | Status: AC
  Administered 2016-06-01 (×2): via INTRAVENOUS

## 2016-05-31 MED ORDER — DIPHENHYDRAMINE HCL 50 MG/ML IJ SOLN
12.5000 mg | Freq: Once | INTRAMUSCULAR | Status: AC
  Administered 2016-06-01: 12.5 mg via INTRAVENOUS
  Filled 2016-05-31: qty 1

## 2016-05-31 MED ORDER — ASPIRIN 300 MG RE SUPP: 300.0000 mg | Freq: Every day | RECTAL | Status: DC

## 2016-05-31 NOTE — ED Provider Notes (Signed)
[facility]-EMERGENCY DEPT Provider Note   CSN: [id number] Arrival date & time: 05/31/16  2104     History   Chief Complaint Chief Complaint  Patient presents with  . Numbness    HPI [name] Holland is a 46 y.o. male history diabetes, previous stroke, venous dural thrombosis, here presenting with left facial numbness. Patient states that he had acute onset of left facial numbness around 11 AM. Felt a pop in the back of his head and felt numb entire left face. Denies any arm or leg numbness or weakness or trouble speaking. He states that he fell exactly the same as when he was diagnosed with a stroke 2 years ago. Of note, when he came to the ER at that time he was noted to have a venous dural thrombosis. He was briefly on anticoagulation but has been off anticoagulation right now.   The history is provided by the patient.    Past Medical History:  Diagnosis Date  . Diabetes mellitus without complication (HCC)   . Stroke [facility])     Patient Active Problem List   Diagnosis Date Noted  . Thrombus   . Morbid obesity (HCC)   . Coital headache   . CVA (cerebral infarction) 05/31/2014  . DM type 2 causing neurological disease, not at goal [facility]) 05/31/2014  . 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 KNEE ARTHROSCOPY         Home Medications    Prior to Admission medications   Medication Sig Start Date End Date Taking? Authorizing Provider  Diclofenac Sodium (PENNSAID) 2 % SOLN Place 2 application onto the skin 2 (two) times daily. Pt applies to knees.   Yes [provider]  ezetimibe (ZETIA) 10 MG tablet Take 1 tablet (10 mg total) by mouth daily. 06/02/14  Yes Joseph Art, DO  metFORMIN (GLUCOPHAGE) 500 MG tablet Take 1 tablet (500 mg total) by mouth daily with breakfast. Patient taking differently: Take 500 mg by mouth 2 (two) times daily with a meal.  06/02/14  Yes Vann, Jessica U, DO  simvastatin  (ZOCOR) 20 MG tablet Take 20 mg by mouth at bedtime.   Yes [provider]    Family History Family History  Problem Relation Age of Onset  . Other Mother   . Diabetes type II Mother   . CAD Father     Social History Social History  Substance Use Topics  . Smoking status: Former Smoker    Packs/day: 0.00  . Smokeless tobacco: Not on file  . Alcohol use No     Allergies   Patient has no known allergies.   Review of Systems Review of Systems  Neurological: Positive for numbness.  All other systems reviewed and are negative.    Physical Exam Updated Vital Signs BP 122/74 (BP Location: Right Arm)   Pulse 80   Temp 98.1 F (36.7 C) (Oral)   Resp 18   Ht 6\' 1"  (1.854 m)   Wt (!) 158.8 kg (350 lb)   SpO2 97%   BMI 46.18 kg/m   Physical Exam  Constitutional: He is oriented to person, place, and time. He appears well-developed and well-nourished.  HENT:  Head: Normocephalic.  Mouth/Throat: Oropharynx is clear and moist.  Eyes: EOM are normal. Pupils are equal, round, and reactive to light.  Neck: Normal range of motion. Neck supple.  Cardiovascular: Normal rate, regular rhythm and normal heart sounds.  Pulmonary/Chest: Effort normal and breath sounds normal. No respiratory distress. He has no wheezes.  Abdominal: Soft. Bowel sounds are normal. He exhibits no distension. There is no tenderness.  Musculoskeletal: Normal range of motion.  Neurological: He is alert and oriented to person, place, and time.  Dec sensation L face. CN otherwise unremarkable. Nl strength and sensation bilateral arm and legs.   Skin: Skin is warm.  Psychiatric: He has a normal mood and affect.  Nursing note and vitals reviewed.    ED Treatments / Results  Labs (all labs ordered are listed, but only abnormal results are displayed) Labs Reviewed  COMPREHENSIVE METABOLIC PANEL - Abnormal; Notable for the following:       Result Value   Glucose, Bld 127 (*)    All other  components within normal limits  CBG MONITORING, ED - Abnormal; Notable for the following:    Glucose-Capillary 128 (*)    All other components within normal limits  I-STAT CHEM 8, ED - Abnormal; Notable for the following:    Glucose, Bld 127 (*)    All other components within normal limits  PROTIME-INR  APTT  CBC  DIFFERENTIAL  I-STAT TROPOININ, ED    EKG  EKG Interpretation None       Radiology Ct Head Wo Contrast  Result Date: 05/31/2016 CLINICAL DATA:  46 y/o  M; tingling sensation to left side of head. EXAM: CT HEAD WITHOUT CONTRAST TECHNIQUE: Contiguous axial images were obtained from the base of the skull through the vertex without intravenous contrast. COMPARISON:  05/31/2014 CT head. FINDINGS: Brain: No evidence of acute infarction, hemorrhage, hydrocephalus, extra-axial collection or mass lesion/mass effect. Vascular: No hyperdense vessel or unexpected calcification. Skull: Normal. Negative for fracture or focal lesion. Sinuses/Orbits: No acute finding. Other: None. IMPRESSION: No acute intracranial abnormality identified. Unremarkable CT of the head. Electronically Signed   By: Mitzi Hansen M.D.   On: 05/31/2016 21:50    Procedures Procedures (including critical care time)  Medications Ordered in ED Medications  LORazepam (ATIVAN) injection 1 mg (1 mg Intravenous Given 05/31/16 2206)     Initial Impression / Assessment and Plan / ED Course  I have reviewed the triage vital signs and the nursing notes.  Pertinent labs & imaging results that were available during my care of the patient were reviewed by me and considered in my medical decision making (see chart for details).     [name] Holland is a 46 y.o. male here with L facial numbness. Previously had venous dural thrombosis but no on anticoagulation anymore. Will get labs, CT head, MRI brain.   11:03 PM CT head unremarkable. Labs unremarkable. Attempted to get MRI, MRA, MRV. However, patient  didn't fit in the MRI machine at [place]. I called Dr. Otelia Limes from neurology. He recommend MRI/MRA/MRV at [facility]. He will see patient. Hospitalist to admit.   Final Clinical Impressions(s) / ED Diagnoses   Final diagnoses:  Numbness    New Prescriptions New Prescriptions   No medications on file     Charlynne Pander, MD 05/31/16 2330

## 2016-05-31 NOTE — H&P (Addendum)
History and Physical    [name] Holland UUE:[id number] DOB: 1970-12-11 DOA: 05/31/2016  PCP: Patient, No Pcp Per  Patient coming from: Home.  Chief Complaint: Left facial numbness and left post auricular headache.  HPI: [name] Holland is a 46 y.o. 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 Patient's symptoms started yesterday morning. Numbness has improved but postauricular headache still persists. Denies any weakness of her upper or lower extremities. Denies any visual symptoms nausea vomiting difficulty speaking or swallowing.  ED Course: In the ER patient appears nonfocal but still complains of the mild numbness on the left side of the face with post auricular headache. CT head is unremarkable. On-call neurologist Dr. Otelia Limes was consulted and at this time requested transfer to [facility] and have MRI and MRV.  Review of Systems: As per HPI, rest all negative.   Past Medical History:  Diagnosis Date  . Diabetes mellitus without complication (HCC)   . Stroke [facility])     Past Surgical History:  Procedure Laterality Date  . KNEE ARTHROSCOPY       reports that he has quit smoking. He smoked 0.00 packs per day. He has never used smokeless tobacco. He reports that he does not drink alcohol or use drugs.  No Known Allergies  Family History  Problem Relation Age of Onset  . Other Mother   . Diabetes type II Mother   . CAD Father     Prior to Admission medications   Medication Sig Start Date End Date Taking? Authorizing Provider  Diclofenac Sodium (PENNSAID) 2 % SOLN Place 2 application onto the skin 2 (two) times daily. Pt applies to knees.   Yes [provider]  ezetimibe (ZETIA) 10 MG tablet Take 1 tablet (10 mg total) by mouth daily. 06/02/14  Yes Joseph Art, DO  metFORMIN (GLUCOPHAGE)  500 MG tablet Take 1 tablet (500 mg total) by mouth daily with breakfast. Patient taking differently: Take 500 mg by mouth 2 (two) times daily with a meal.  06/02/14  Yes Vann, Jessica U, DO  simvastatin (ZOCOR) 20 MG tablet Take 20 mg by mouth at bedtime.   Yes [provider]    Physical Exam: Vitals:   05/31/16 2112 05/31/16 2159  BP: 133/74 122/74  Pulse: 81 80  Resp: 16 18  Temp: 98.1 F (36.7 C) 98.1 F (36.7 C)  TempSrc: Oral Oral  SpO2: 96% 97%  Weight:  (!) 158.8 kg (350 lb)  Height:  6\' 1"  (1.854 m)      Constitutional: Moderately built and nourished. Vitals:   05/31/16 2112 05/31/16 2159  BP: 133/74 122/74  Pulse: 81 80  Resp: 16 18  Temp: 98.1 F (36.7 C) 98.1 F (36.7 C)  TempSrc: Oral Oral  SpO2: 96% 97%  Weight:  (!) 158.8 kg (350 lb)  Height:  6\' 1"  (1.854 m)   Eyes: Anicteric no pallor. ENMT: No discharge from the ears eyes nose and mouth. Neck: No mass felt. No neck rigidity. Respiratory: No rhonchi or crepitations. Cardiovascular: S1-S2 heard no murmurs appreciated. Abdomen: Soft nontender bowel sounds present. Musculoskeletal: No edema. No joint effusion. Skin: No rash. Skin appears warm. Neurologic: Alert awake oriented to time place and person. Moves all extremities 5 x 5. No facial asymmetry tongue is midline. Psychiatric: Appears normal. Normal affect.   Labs on  Admission: I have personally reviewed following labs and imaging studies  CBC:  Recent Labs Lab 05/31/16 2132 05/31/16 2145  WBC 9.2  --   NEUTROABS 5.3  --   HGB 14.8 14.6  HCT 43.8 43.0  MCV 86.9  --   PLT 168  --    Basic Metabolic Panel:  Recent Labs Lab 05/31/16 2132 05/31/16 2145  NA 136 139  K 3.6 3.8  CL 101 101  CO2 26  --   GLUCOSE 127* 127*  BUN 13 15  CREATININE 0.92 0.80  CALCIUM 9.0  --    GFR: Estimated Creatinine Clearance: 183.9 mL/min (by C-G formula based on SCr of 0.8 mg/dL). Liver Function Tests:  Recent Labs Lab  05/31/16 2132  AST 30  ALT 38  ALKPHOS 73  BILITOT 0.6  PROT 7.2  ALBUMIN 4.1   No results for input(s): LIPASE, AMYLASE in the last 168 hours. No results for input(s): AMMONIA in the last 168 hours. Coagulation Profile:  Recent Labs Lab 05/31/16 2132  INR 0.98   Cardiac Enzymes: No results for input(s): CKTOTAL, CKMB, CKMBINDEX, TROPONINI in the last 168 hours. BNP (last 3 results) No results for input(s): PROBNP in the last 8760 hours. HbA1C: No results for input(s): HGBA1C in the last 72 hours. CBG:  Recent Labs Lab 05/31/16 2153  GLUCAP 128*   Lipid Profile: No results for input(s): CHOL, HDL, LDLCALC, TRIG, CHOLHDL, LDLDIRECT in the last 72 hours. Thyroid Function Tests: No results for input(s): TSH, T4TOTAL, FREET4, T3FREE, THYROIDAB in the last 72 hours. Anemia Panel: No results for input(s): VITAMINB12, FOLATE, FERRITIN, TIBC, IRON, RETICCTPCT in the last 72 hours. Urine analysis:    Component Value Date/Time   COLORURINE YELLOW 05/31/2014 2331   APPEARANCEUR CLEAR 05/31/2014 2331   LABSPEC >1.046 (H) 05/31/2014 2331   PHURINE 5.5 05/31/2014 2331   GLUCOSEU 100 (A) 05/31/2014 2331   HGBUR TRACE (A) 05/31/2014 2331   BILIRUBINUR NEGATIVE 05/31/2014 2331   KETONESUR NEGATIVE 05/31/2014 2331   PROTEINUR 100 (A) 05/31/2014 2331   UROBILINOGEN 0.2 05/31/2014 2331   NITRITE NEGATIVE 05/31/2014 2331   LEUKOCYTESUR NEGATIVE 05/31/2014 2331   Sepsis Labs: @LABRCNTIP (procalcitonin:4,lacticidven:4) )No results found for this or any previous visit (from the past 240 hour(s)).   Radiological Exams on Admission: Ct Head Wo Contrast  Result Date: 05/31/2016 CLINICAL DATA:  46 y/o  M; tingling sensation to left side of head. EXAM: CT HEAD WITHOUT CONTRAST TECHNIQUE: Contiguous axial images were obtained from the base of the skull through the vertex without intravenous contrast. COMPARISON:  05/31/2014 CT head. FINDINGS: Brain: No evidence of acute infarction,  hemorrhage, hydrocephalus, extra-axial collection or mass lesion/mass effect. Vascular: No hyperdense vessel or unexpected calcification. Skull: Normal. Negative for fracture or focal lesion. Sinuses/Orbits: No acute finding. Other: None. IMPRESSION: No acute intracranial abnormality identified. Unremarkable CT of the head. Electronically Signed   By: Mitzi Hansen M.D.   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 Left facial numbness with left-sided headache - concerning for recurrent sinus thrombosis versus possible TIA or stroke. Discussed with neurologist Dr. Otelia Limes who recommended patient to be transferred to [facility] to get further workup including MRI brain with and without contrast, MRA brain, MRV brain. Addendum - discussed MRV findings with Dr. Otelia Limes. Dr. Otelia Limes advised to get  CT venogram of the head. 2. Diabetes mellitus type 2 - will keep patient on sliding scale coverage and hold metformin for now. 3. Hyperlipidemia on statins.  Dr. Ival Bible would be the accepting physician.   DVT prophylaxis: Lovenox. Code Status: Full code.  Family Communication: Discussed with patient.  Disposition Plan: Home.  Consults called: Neurologist.  Admission status: Observation.    Eduard Clos MD [facility] Pager [phone].  If 7PM-7AM, please contact night-coverage [email or web address] Password [facility]  05/31/2016, 11:48 PM

## 2016-05-31 NOTE — ED Notes (Signed)
Pt states that pain has come back. Dr Silverio Lay has been made aware.

## 2016-05-31 NOTE — ED Notes (Addendum)
Pt is alert and oriented x 4 and is verbally responsive. Pt states that he felt a pop to the left side of his head around 11 am with associated numbness, pt denies any N/V, CP, SOB . Pt does report that he had a previous episode a few years ago and had a blood clot. Pt does report that he has some blurred vision. NIH scale performed

## 2016-05-31 NOTE — ED Triage Notes (Signed)
Patient reports tingling sensation to left side of head. Reports hx of stroke 2 years ago. States today starting at 11 having same exact feeling. Cranial nerves 1-12 in tact. Slight sensation change to right side of face.

## 2016-06-01 ENCOUNTER — Observation Stay (HOSPITAL_COMMUNITY): Payer: BLUE CROSS/BLUE SHIELD

## 2016-06-01 ENCOUNTER — Observation Stay (HOSPITAL_BASED_OUTPATIENT_CLINIC_OR_DEPARTMENT_OTHER): Payer: BLUE CROSS/BLUE SHIELD

## 2016-06-01 ENCOUNTER — Encounter (HOSPITAL_COMMUNITY): Payer: BLUE CROSS/BLUE SHIELD

## 2016-06-01 DIAGNOSIS — I82419 Acute embolism and thrombosis of unspecified femoral vein: Secondary | ICD-10-CM | POA: Diagnosis not present

## 2016-06-01 DIAGNOSIS — G459 Transient cerebral ischemic attack, unspecified: Secondary | ICD-10-CM

## 2016-06-01 DIAGNOSIS — E1149 Type 2 diabetes mellitus with other diabetic neurological complication: Secondary | ICD-10-CM | POA: Diagnosis not present

## 2016-06-01 DIAGNOSIS — I639 Cerebral infarction, unspecified: Secondary | ICD-10-CM

## 2016-06-01 DIAGNOSIS — R200 Anesthesia of skin: Secondary | ICD-10-CM | POA: Diagnosis not present

## 2016-06-01 LAB — ECHOCARDIOGRAM COMPLETE
AVLVOTPG: 5 mmHg
CHL CUP MV DEC (S): 246
E decel time: 246 msec
E/e' ratio: 7.98
FS: 37 % (ref 28–44)
Height: 73 in
IV/PV OW: 0.96
LA ID, A-P, ES: 41 mm
LA diam end sys: 41 mm
LA diam index: 1.51 cm/m2
LA vol A4C: 76.4 ml
LA vol index: 28.9 mL/m2
LA vol: 78.2 mL
LDCA: 3.8 cm2
LV E/e' medial: 7.98
LV E/e'average: 7.98
LV PW d: 12.7 mm — AB (ref 0.6–1.1)
LV TDI E'LATERAL: 11.7
LV e' LATERAL: 11.7 cm/s
LVOT VTI: 24.3 cm
LVOTD: 22 mm
LVOTPV: 112 cm/s
LVOTSV: 92 mL
MV Peak grad: 3 mmHg
MV pk A vel: 72 m/s
MVPKEVEL: 93.4 m/s
RV sys press: 21 mmHg
Reg peak vel: 212 cm/s
TAPSE: 18.8 mm
TDI e' medial: 10.2
TR max vel: 212 cm/s
Weight: 5520 oz

## 2016-06-01 LAB — VAS US CAROTID
LCCADDIAS: -20 cm/s
LCCADSYS: -86 cm/s
LEFT ECA DIAS: -18 cm/s
LEFT VERTEBRAL DIAS: 16 cm/s
LICADDIAS: -28 cm/s
LICADSYS: -78 cm/s
LICAPSYS: -57 cm/s
Left CCA prox dias: 30 cm/s
Left CCA prox sys: 132 cm/s
Left ICA prox dias: -23 cm/s
RIGHT ECA DIAS: -17 cm/s
RIGHT VERTEBRAL DIAS: 10 cm/s
Right CCA prox dias: 17 cm/s
Right CCA prox sys: 106 cm/s
Right cca dist sys: -69 cm/s

## 2016-06-01 LAB — GLUCOSE, CAPILLARY
Glucose-Capillary: 138 mg/dL — ABNORMAL HIGH (ref 65–99)
Glucose-Capillary: 119 mg/dL — ABNORMAL HIGH (ref 65–99)
Glucose-Capillary: 103 mg/dL — ABNORMAL HIGH (ref 65–99)
Glucose-Capillary: 136 mg/dL — ABNORMAL HIGH (ref 65–99)

## 2016-06-01 LAB — CBG MONITORING, ED: Glucose-Capillary: 149 mg/dL — ABNORMAL HIGH (ref 65–99)

## 2016-06-01 MED ORDER — GADOBENATE DIMEGLUMINE 529 MG/ML IV SOLN
20.0000 mL | Freq: Once | INTRAVENOUS | Status: AC | PRN
  Administered 2016-06-01: 20 mL via INTRAVENOUS

## 2016-06-01 MED ORDER — RIVAROXABAN 15 MG PO TABS
15.0000 mg | ORAL_TABLET | Freq: Two times a day (BID) | ORAL | Status: DC
  Administered 2016-06-01 – 2016-06-02 (×2): 15 mg via ORAL
  Filled 2016-06-01 (×2): qty 1

## 2016-06-01 MED ORDER — IOPAMIDOL (ISOVUE-300) INJECTION 61%
INTRAVENOUS | Status: AC
  Administered 2016-06-01: 100 mL
  Filled 2016-06-01: qty 100

## 2016-06-01 MED ORDER — RIVAROXABAN 20 MG PO TABS: 20.0000 mg | ORAL_TABLET | Freq: Every day | ORAL | Status: DC

## 2016-06-01 MED ORDER — IOPAMIDOL (ISOVUE-300) INJECTION 61%
INTRAVENOUS | Status: AC
  Filled 2016-06-01: qty 75

## 2016-06-01 NOTE — ED Notes (Signed)
Report was given to Debbie at [facility], [facility] called

## 2016-06-01 NOTE — Consult Note (Signed)
NEURO HOSPITALIST CONSULT NOTE   Requestig physician: Dr. 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 male who presents with recurrence of left sided headache rated as 7-9/10. 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 Also reports mild sensory loss and tingling sensation on the left. Also endorses left monocular visual blurring. His symptoms began on Monday at 11 AM when he felt a pop in the back of his head. No associated confusion, difficulty speaking, limb weakness or limb numbness. He was on anticoagulation for a period of time after the sinus thrombosis diagnosis 2 years ago, then was taken off this medication.     Past Medical History:  Diagnosis Date  . Diabetes mellitus without complication (HCC)   . Stroke [facility])     Past Surgical History:  Procedure Laterality Date  . KNEE ARTHROSCOPY      Family History  Problem Relation Age of Onset  . Other Mother   . Diabetes type II Mother   . CAD Father    Social History:  reports that he has quit smoking. He smoked 0.00 packs per day. He has never used smokeless tobacco. He reports that he does not drink alcohol or use drugs.  No Known Allergies  MEDICATIONS:                                                                                                                     Prior to Admission:  Prescriptions Prior to Admission  Medication Sig Dispense Refill Last Dose  . Diclofenac Sodium (PENNSAID) 2 % SOLN Place 2 application onto the skin 2 (two) times daily. Pt applies to knees.   05/30/2016 at Unknown time  . ezetimibe (ZETIA)  10 MG tablet Take 1 tablet (10 mg total) by mouth daily. 30 tablet 0 05/30/2016 at Unknown time  .  metFORMIN (GLUCOPHAGE) 500 MG tablet Take 1 tablet (500 mg total) by mouth daily with breakfast. (Patient taking differently: Take 500 mg by mouth 2 (two) times daily with a meal. ) 30 tablet 1 05/30/2016 at Unknown time  . simvastatin (ZOCOR) 20 MG tablet Take 20 mg by mouth at bedtime.   05/30/2016 at Unknown time   Scheduled: . aspirin  300 mg Rectal Daily   Or  . aspirin  325 mg Oral Daily  . enoxaparin (LOVENOX) injection  40 mg Subcutaneous QHS  . ezetimibe  10 mg Oral Daily  . insulin aspart  0-9 Units Subcutaneous TID WC  . simvastatin  20 mg Oral QHS    ROS:                                                                                                                                       As per HPI.   Blood pressure 110/67, pulse 68, temperature 98.1 F (36.7 C), temperature source Oral, resp. rate 20, height 6\' 1"  (1.854 m), weight (!) 181.9 kg (401 lb), SpO2 95 %.   General Examination:                                                                                                      HEENT-  /AT    Lungs- Respirations unlabored Extremities- No edema  Neurological Examination Mental Status: Alert, oriented, thought content appropriate.  Speech fluent without evidence of aphasia.  Able to follow all commands without difficulty. Cranial Nerves: II:  Visual fields grossly normal, PERRL III,IV, VI: ptosis not present, EOMI V,VII: smile symmetric, facial temp sensation decreased on left VIII: hearing intact to conversation IX,X: no hypophonia XI: intact XII: midline tongue extension Motor: Right : Upper extremity   5/5    Left:     Upper extremity   5/5  Lower extremity   5/5     Lower extremity   5/5 Normal tone throughout; no atrophy noted Sensory: Temperature and light touch intact in all 4 limbs Deep Tendon Reflexes: 2+ and symmetric throughout Cerebellar: No ataxia  with FNF bilaterally.  Gait: Deferred.  Lab Results: Basic Metabolic Panel:  Recent Labs Lab 05/31/16 2132 05/31/16 2145  NA 136 139  K 3.6 3.8  CL 101 101  CO2 26  --   GLUCOSE 127* 127*  BUN 13 15  CREATININE 0.92 0.80  CALCIUM 9.0  --     Liver Function Tests:  Recent Labs Lab 05/31/16 2132  AST 30  ALT 38  ALKPHOS 73  BILITOT 0.6  PROT 7.2  ALBUMIN 4.1   No results for input(s): LIPASE, AMYLASE in the last 168 hours. No results for input(s): AMMONIA in the last 168 hours.  CBC:  Recent Labs Lab 05/31/16 2132 05/31/16 2145  WBC 9.2  --   NEUTROABS 5.3  --   HGB 14.8 14.6  HCT 43.8 43.0  MCV 86.9  --   PLT 168  --     Cardiac Enzymes: No results for input(s): CKTOTAL, CKMB, CKMBINDEX, TROPONINI in the last 168 hours.  Lipid Panel: No results for input(s): CHOL, TRIG, HDL, CHOLHDL, VLDL, LDLCALC in the last 168 hours.  CBG:  Recent Labs Lab 05/31/16 2153 06/01/16 0136  GLUCAP 128* 149*    Microbiology: Results for orders placed or performed during the hospital encounter of 07/19/13  Culture, Group A Strep     Status: None   Collection Time: 07/19/13  3:49 PM  Result Value Ref Range Status   Specimen Description THROAT  Final   Special Requests NONE  Final   Culture   Final    No Beta Hemolytic Streptococci Isolated Performed at [facility]   Report Status 07/21/2013 FINAL  Final    Coagulation Studies:  Recent Labs  05/31/16 2132  LABPROT 13.0  INR 0.98    Imaging: Ct Head Wo Contrast  Result Date: 05/31/2016 CLINICAL DATA:  46 y/o  M; tingling sensation to left side of head. EXAM: CT HEAD WITHOUT CONTRAST TECHNIQUE: Contiguous axial images were obtained from the base of the skull through the vertex without intravenous contrast. COMPARISON:  05/31/2014 CT head. FINDINGS: Brain: No evidence of acute infarction, hemorrhage, hydrocephalus, extra-axial collection or mass lesion/mass effect. Vascular: No hyperdense vessel  or unexpected calcification. Skull: Normal. Negative for fracture or focal lesion. Sinuses/Orbits: No acute finding. Other: None. IMPRESSION: No acute intracranial abnormality identified. Unremarkable CT of the head. Electronically Signed   By: Mitzi Hansen M.D.   On: 05/31/2016 21:50   Mr Angiogram Head Wo Contrast  Result Date: 06/01/2016 CLINICAL DATA:  Initial evaluation for acute left facial numbness. EXAM: MRI HEAD WITHOUT AND WITH CONTRAST MRA HEAD WITHOUT CONTRAST MRV HEAD WITHOUT CONTRAST TECHNIQUE: Multiplanar, multiecho pulse sequences of the brain and surrounding structures were obtained without and with intravenous contrast. Angiographic images of the intracranial venous structures were obtained using MRV technique without intravenous contrast. CONTRAST:  20mL MULTIHANCE GADOBENATE DIMEGLUMINE 529 MG/ML IV SOLN COMPARISON:  Prior CT from 05/31/2016. FINDINGS: MRI HEAD: Study degraded by motion artifact and body habitus. Cerebral volume normal. No focal parenchymal signal abnormality. No significant cerebral white matter disease. No abnormal foci of restricted diffusion to suggest acute or subacute ischemia. Gray-white matter differentiation maintained. No areas of chronic infarction. No evidence for acute or chronic intracranial hemorrhage. No mass lesion, midline shift or mass effect. No hydrocephalus. No extra-axial fluid collection. No abnormal enhancement. Pituitary gland and suprasellar region within normal limits. Midline structures intact and normal. Major intracranial vascular flow voids maintained. Craniocervical junction normal. Bone marrow signal intensity normal. No scalp soft tissue abnormality. Globes and orbital soft tissues within normal limits. Retention cyst noted within the left maxillary sinus. Scattered mucosal thickening within the ethmoidal air cells. Paranasal sinuses are otherwise clear. No mastoid effusion. Inner ear structures grossly normal. MRA HEAD: ANTERIOR  CIRCULATION: Study degraded by motion artifact and body habitus. Distal cervical segments of the internal carotid arteries are patent with antegrade flow. Petrous, cavernous,  supraclinoid segments patent without flow-limiting stenosis. A1 segments patent bilaterally. Anterior communicating artery normal. Anterior cerebral arteries patent to their distal aspects. M1 segments patent without stenosis or occlusion. No proximal M2 occlusion. Distal MCA branches well opacified and symmetric bilaterally. POSTERIOR CIRCULATION: Vertebral arteries patent to the vertebrobasilar junction. Left vertebral artery dominant. Left PICA patent proximally. Right PICA not visualized. Basilar artery widely patent to its distal aspect. Superior cerebellar and posterior cerebral arteries patent bilaterally. Small left posterior communicating artery noted. No aneurysm or vascular malformation. MRV HEAD: Study degraded by motion artifact and body habitus. Superior sagittal sinus widely patent. Right transverse and sigmoid sinuses are patent as is the proximal right internal jugular vein. Straight sinus, vein of Galen, and internal cerebral veins are patent. There is persistent tubular nonocclusive filling defect within the lateral left transverse sinus, which may reflect persistent and/or current dural sinus thrombosis (series 9, image 31). This is relatively similar as compared to previous MRV. IMPRESSION: 1. Moderately degraded exam due to motion artifact and body habitus. 2. Normal contrast-enhanced MRI of the brain. 3. Normal MRA of the brain. 4. Small tubular nonocclusive filling defect within the left transverse sinus, concerning for possible recurrent and/or residual dural sinus thrombosis. Electronically Signed   By: Rise Mu M.D.   On: 06/01/2016 06:54   Mr Laqueta Jean ZO Contrast  Result Date: 06/01/2016 CLINICAL DATA:  Initial evaluation for acute left facial numbness. EXAM: MRI HEAD WITHOUT AND WITH CONTRAST MRA HEAD  WITHOUT CONTRAST MRV HEAD WITHOUT CONTRAST TECHNIQUE: Multiplanar, multiecho pulse sequences of the brain and surrounding structures were obtained without and with intravenous contrast. Angiographic images of the intracranial venous structures were obtained using MRV technique without intravenous contrast. CONTRAST:  20mL MULTIHANCE GADOBENATE DIMEGLUMINE 529 MG/ML IV SOLN COMPARISON:  Prior CT from 05/31/2016. FINDINGS: MRI HEAD: Study degraded by motion artifact and body habitus. Cerebral volume normal. No focal parenchymal signal abnormality. No significant cerebral white matter disease. No abnormal foci of restricted diffusion to suggest acute or subacute ischemia. Gray-white matter differentiation maintained. No areas of chronic infarction. No evidence for acute or chronic intracranial hemorrhage. No mass lesion, midline shift or mass effect. No hydrocephalus. No extra-axial fluid collection. No abnormal enhancement. Pituitary gland and suprasellar region within normal limits. Midline structures intact and normal. Major intracranial vascular flow voids maintained. Craniocervical junction normal. Bone marrow signal intensity normal. No scalp soft tissue abnormality. Globes and orbital soft tissues within normal limits. Retention cyst noted within the left maxillary sinus. Scattered mucosal thickening within the ethmoidal air cells. Paranasal sinuses are otherwise clear. No mastoid effusion. Inner ear structures grossly normal. MRA HEAD: ANTERIOR CIRCULATION: Study degraded by motion artifact and body habitus. Distal cervical segments of the internal carotid arteries are patent with antegrade flow. Petrous, cavernous, supraclinoid segments patent without flow-limiting stenosis. A1 segments patent bilaterally. Anterior communicating artery normal. Anterior cerebral arteries patent to their distal aspects. M1 segments patent without stenosis or occlusion. No proximal M2 occlusion. Distal MCA branches well opacified  and symmetric bilaterally. POSTERIOR CIRCULATION: Vertebral arteries patent to the vertebrobasilar junction. Left vertebral artery dominant. Left PICA patent proximally. Right PICA not visualized. Basilar artery widely patent to its distal aspect. Superior cerebellar and posterior cerebral arteries patent bilaterally. Small left posterior communicating artery noted. No aneurysm or vascular malformation. MRV HEAD: Study degraded by motion artifact and body habitus. Superior sagittal sinus widely patent. Right transverse and sigmoid sinuses are patent as is the proximal right internal jugular vein. Straight sinus, vein  of Galen, and internal cerebral veins are patent. There is persistent tubular nonocclusive filling defect within the lateral left transverse sinus, which may reflect persistent and/or current dural sinus thrombosis (series 9, image 31). This is relatively similar as compared to previous MRV. IMPRESSION: 1. Moderately degraded exam due to motion artifact and body habitus. 2. Normal contrast-enhanced MRI of the brain. 3. Normal MRA of the brain. 4. Small tubular nonocclusive filling defect within the left transverse sinus, concerning for possible recurrent and/or residual dural sinus thrombosis. Electronically Signed   By: Rise Mu M.D.   On: 06/01/2016 06:54   Mr Mrv Head Wo Cm  Result Date: 06/01/2016 CLINICAL DATA:  Initial evaluation for acute left facial numbness. EXAM: MRI HEAD WITHOUT AND WITH CONTRAST MRA HEAD WITHOUT CONTRAST MRV HEAD WITHOUT CONTRAST TECHNIQUE: Multiplanar, multiecho pulse sequences of the brain and surrounding structures were obtained without and with intravenous contrast. Angiographic images of the intracranial venous structures were obtained using MRV technique without intravenous contrast. CONTRAST:  20mL MULTIHANCE GADOBENATE DIMEGLUMINE 529 MG/ML IV SOLN COMPARISON:  Prior CT from 05/31/2016. FINDINGS: MRI HEAD: Study degraded by motion artifact and body  habitus. Cerebral volume normal. No focal parenchymal signal abnormality. No significant cerebral white matter disease. No abnormal foci of restricted diffusion to suggest acute or subacute ischemia. Gray-white matter differentiation maintained. No areas of chronic infarction. No evidence for acute or chronic intracranial hemorrhage. No mass lesion, midline shift or mass effect. No hydrocephalus. No extra-axial fluid collection. No abnormal enhancement. Pituitary gland and suprasellar region within normal limits. Midline structures intact and normal. Major intracranial vascular flow voids maintained. Craniocervical junction normal. Bone marrow signal intensity normal. No scalp soft tissue abnormality. Globes and orbital soft tissues within normal limits. Retention cyst noted within the left maxillary sinus. Scattered mucosal thickening within the ethmoidal air cells. Paranasal sinuses are otherwise clear. No mastoid effusion. Inner ear structures grossly normal. MRA HEAD: ANTERIOR CIRCULATION: Study degraded by motion artifact and body habitus. Distal cervical segments of the internal carotid arteries are patent with antegrade flow. Petrous, cavernous, supraclinoid segments patent without flow-limiting stenosis. A1 segments patent bilaterally. Anterior communicating artery normal. Anterior cerebral arteries patent to their distal aspects. M1 segments patent without stenosis or occlusion. No proximal M2 occlusion. Distal MCA branches well opacified and symmetric bilaterally. POSTERIOR CIRCULATION: Vertebral arteries patent to the vertebrobasilar junction. Left vertebral artery dominant. Left PICA patent proximally. Right PICA not visualized. Basilar artery widely patent to its distal aspect. Superior cerebellar and posterior cerebral arteries patent bilaterally. Small left posterior communicating artery noted. No aneurysm or vascular malformation. MRV HEAD: Study degraded by motion artifact and body habitus. Superior  sagittal sinus widely patent. Right transverse and sigmoid sinuses are patent as is the proximal right internal jugular vein. Straight sinus, vein of Galen, and internal cerebral veins are patent. There is persistent tubular nonocclusive filling defect within the lateral left transverse sinus, which may reflect persistent and/or current dural sinus thrombosis (series 9, image 31). This is relatively similar as compared to previous MRV. IMPRESSION: 1. Moderately degraded exam due to motion artifact and body habitus. 2. Normal contrast-enhanced MRI of the brain. 3. Normal MRA of the brain. 4. Small tubular nonocclusive filling defect within the left transverse sinus, concerning for possible recurrent and/or residual dural sinus thrombosis. Electronically Signed   By: Rise Mu M.D.   On: 06/01/2016 06:54    Assessment: Left sided headache with left facial numbness, left monocular visual blurring and left facial/scalp paresthesia 1.  MRV reveals a small tubular nonocclusive filling defect within the left transverse sinus, concerning for possible recurrent and/or residual dural sinus thrombosis. However, MRI brain shows no corresponding focus of restricted diffusion at the location of the possible sinus thrombosis. Therefore, the tubular filling defect is felt most likely to be chronic. There is also no restricted diffusion within the adjacent brain parenchyma, which is reassuring.  2. Would obtain a CTV to further assess the filling defect. Flow related artifact on MRV can often result in a false positive scan. If so, the CTV will show normal opacification of the transverse sinus at the location of the filling defect seen on MRV.   Recommendation: 1. CTV of head for correlation with MRV result.  2. D-dimer. 3. Headache pain management. A trial of 25 mg IV Phenergan may be efficacious.  4. If CTV confirms MRV result, start oral anticoagulation and schedule outpatient Neurology follow up for one  month and repeat MRV or CTV in 6 months.  5. No strong indication for ASA. Should discontinue if anticoagulation is started.   Electronically signed: Dr. Caryl Pina 06/01/2016, 7:00 AM

## 2016-06-01 NOTE — Evaluation (Signed)
Physical Therapy Evaluation Patient Details Name: [name] Holland MRN: [id number] DOB: 02-06-1970 Today's Date: 06/01/2016   History of Present Illness  patient is a 46 yo male who presents with Left sided headache with left facial numbness, left monocular visual blurring and left facial/scalp paresthesia. Neuro workup underway  Clinical Impression  Orders received for PT evaluation. Patient demonstrates no deficits in functional mobility at this time. Patient mobilizing well, shows good high level balance and was able to ambulate and perform stairs independently. Educated patient on BEFAST signs of stroke. No further acute PT needed. Will sign off.      Follow Up Recommendations No PT follow up    Equipment Recommendations  None recommended by PT    Recommendations for Other Services       Precautions / Restrictions        Mobility  Bed Mobility Overal bed mobility: Independent                Transfers Overall transfer level: Independent                  Ambulation/Gait Ambulation/Gait assistance: Independent     Gait Pattern/deviations: WFL(Within Functional Limits)     General Gait Details: steady with ambulation and mobility  Stairs            Wheelchair Mobility    Modified Rankin (Stroke Patients Only) Modified Rankin (Stroke Patients Only) Pre-Morbid Rankin Score: No symptoms Modified Rankin: No symptoms     Balance Overall balance assessment: No apparent balance deficits (not formally assessed)                               Standardized Balance Assessment Standardized Balance Assessment : Dynamic Gait Index   Dynamic Gait Index Level Surface: Normal Change in Gait Speed: Normal Gait with Horizontal Head Turns: Normal Gait with Vertical Head Turns: Normal Gait and Pivot Turn: Normal Step Over Obstacle: Normal Step Around Obstacles: Normal Steps: Normal Total Score: 24       Pertinent Vitals/Pain       Home Living Family/patient expects to be discharged to:: Private residence Living Arrangements: Spouse/significant other Available Help at Discharge: Family Type of Home: House Home Access: Stairs to enter   Secretary/administrator of Steps: 3 Home Layout: One level Home Equipment: None      Prior Function Level of Independence: Independent               Hand Dominance   Dominant Hand: Right    Extremity/Trunk Assessment   Upper Extremity Assessment Upper Extremity Assessment: Overall WFL for tasks assessed    Lower Extremity Assessment Lower Extremity Assessment: Overall WFL for tasks assessed       Communication   Communication: No difficulties  Cognition Arousal/Alertness: Awake/alert Behavior During Therapy: WFL for tasks assessed/performed Overall Cognitive Status: Within Functional Limits for tasks assessed                                        General Comments      Exercises     Assessment/Plan    PT Assessment Patent does not need any further PT services  PT Problem List         PT Treatment Interventions      PT Goals (Current goals can be found in the Care Plan section)  Acute Rehab PT Goals PT Goal Formulation: All assessment and education complete, DC therapy    Frequency     Barriers to discharge        Co-evaluation PT/OT/SLP Co-Evaluation/Treatment:  (dove tailed session)             AM-PAC PT "6 Clicks" Daily Activity  Outcome Measure Difficulty turning over in bed (including adjusting bedclothes, sheets and blankets)?: None Difficulty moving from lying on back to sitting on the side of the bed? : None Difficulty sitting down on and standing up from a chair with arms (e.g., wheelchair, bedside commode, etc,.)?: None Help needed moving to and from a bed to chair (including a wheelchair)?: None Help needed walking in hospital room?: None Help needed climbing 3-5 steps with a railing? : None 6 Click  Score: 24    End of Session   Activity Tolerance: Patient tolerated treatment well Patient left: in bed;with call bell/phone within reach;with family/visitor present Nurse Communication: Mobility status PT Visit Diagnosis: Unsteadiness on feet (R26.81)    Time: 0912-0923 PT Time Calculation (min) (ACUTE ONLY): 11 min   Charges:   PT Evaluation $PT Eval Low Complexity: 1 Procedure     PT G Codes:        Charlotte Crumb, PT DPT  [phone]   Fabio Asa 06/01/2016, 9:45 AM

## 2016-06-01 NOTE — Progress Notes (Signed)
ANTICOAGULATION CONSULT NOTE - Initial Consult  Pharmacy Consult for Xarelto Indication: DVT  No Known Allergies  Patient Measurements: Height: 6\' 1"  (185.4 cm) Weight: (!) 345 lb (156.5 kg) IBW/kg (Calculated) : 79.9  Vital Signs: Temp: 98.1 F (36.7 C) (05/22 1215) Temp Source: Oral (05/22 1215) BP: 130/69 (05/22 1215) Pulse Rate: 69 (05/22 1215)  Labs:  Recent Labs  05/31/16 2132 05/31/16 2145  HGB 14.8 14.6  HCT 43.8 43.0  PLT 168  --   APTT 26  --   LABPROT 13.0  --   INR 0.98  --   CREATININE 0.92 0.80    Estimated Creatinine Clearance: 182.2 mL/min (by C-G formula based on SCr of 0.8 mg/dL).   Medical History: Past Medical History:  Diagnosis Date  . Diabetes mellitus without complication (HCC)   . Stroke [facility])     Medications:  Prescriptions Prior to Admission  Medication Sig Dispense Refill Last Dose  . Diclofenac Sodium (PENNSAID) 2 % SOLN Place 2 application onto the skin 2 (two) times daily. Pt applies to knees.   05/30/2016 at Unknown time  . ezetimibe (ZETIA) 10 MG tablet Take 1 tablet (10 mg total) by mouth daily. 30 tablet 0 05/30/2016 at Unknown time  . metFORMIN (GLUCOPHAGE) 500 MG tablet Take 1 tablet (500 mg total) by mouth daily with breakfast. (Patient taking differently: Take 500 mg by mouth 2 (two) times daily with a meal. ) 30 tablet 1 05/30/2016 at Unknown time  . simvastatin (ZOCOR) 20 MG tablet Take 20 mg by mouth at bedtime.   05/30/2016 at Unknown time    Assessment: Mr Baack is a 58 yoM found to have a DVT. Two years ago patient was on Xarelto for 6 months, however patient was not taking any anticoagulation prior to admission. Renal function stable with CrCl >100 mL/min.    Goal of Therapy:  Monitor platelets by anticoagulation protocol: Yes   Plan:  Xarelto 15mg  BID x21 days (through 06/22/16 Start 20mg  QD with supper on 06/23/16 Monitor renal function and for s/sx of bleeding  Alinda Money L Daneille Desilva 06/01/2016,1:16 PM

## 2016-06-01 NOTE — Progress Notes (Signed)
  Echocardiogram 2D Echocardiogram has been performed.  Delcie Roch 06/01/2016, 2:53 PM

## 2016-06-01 NOTE — Evaluation (Signed)
Speech Language Pathology Evaluation Patient Details Name: [name] Holland MRN: [id number] DOB: 14-Nov-1970 Today's Date: 06/01/2016 Time: 9678-9381 SLP Time Calculation (min) (ACUTE ONLY): 17 min  Problem List:  Patient Active Problem List   Diagnosis Date Noted  . Left facial numbness 05/31/2016  . HLD (hyperlipidemia) 05/31/2016  . Thrombus   . Morbid obesity (HCC)   . Coital headache   . CVA (cerebral infarction) 05/31/2014  . DM type 2 causing neurological disease, not at goal [facility]) 05/31/2014  . Cerebral venous sinus thrombosis, acute 05/31/2014   Past Medical History:  Past Medical History:  Diagnosis Date  . Diabetes mellitus without complication (HCC)   . Stroke [facility])    Past Surgical History:  Past Surgical History:  Procedure Laterality Date  . KNEE ARTHROSCOPY     HPI:  Patient is a 46 yo male who presents with Left sided headache with left facial numbness, left monocular visual blurring and left facial/scalp paresthesia. Neuro workup underway   Assessment / Plan / Recommendation Clinical Impression  Pt appears to be at his cognitive-linguistic baseline. He and his family deny any subjective complaints. SLP reiterated s/s of stroke. No SLP f/u indicated.    SLP Assessment  SLP Recommendation/Assessment: Patient does not need any further Speech Lanaguage Pathology Services SLP Visit Diagnosis: Cognitive communication deficit (R41.841)    Follow Up Recommendations  None    Frequency and Duration           SLP Evaluation Cognition  Overall Cognitive Status: Within Functional Limits for tasks assessed       Comprehension  Auditory Comprehension Overall Auditory Comprehension: Appears within functional limits for tasks assessed    Expression Expression Primary Mode of Expression: Verbal Verbal Expression Overall Verbal Expression: Appears within functional limits for tasks assessed Written Expression Dominant Hand: Right   Oral / Motor  Motor  Speech Overall Motor Speech: Appears within functional limits for tasks assessed   GO          Functional Assessment Tool Used: skilled clinical judgment Functional Limitations: Memory Memory Current Status (O1751): 0 percent impaired, limited or restricted Memory Goal Status (W2585): 0 percent impaired, limited or restricted Memory Discharge Status (I7782): 0 percent impaired, limited or restricted         Maxcine Ham 06/01/2016, 12:05 PM  Maxcine Ham, M.A. CCC-SLP [phone]

## 2016-06-01 NOTE — Progress Notes (Signed)
STROKE TEAM PROGRESS NOTE   HISTORY OF PRESENT ILLNESS (per record) [name] Holland is an 46 y.o. male who presents with recurrence of left sided headache rated as 7-9/10. 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 Also reports mild sensory loss and tingling sensation on the left. Also endorses left monocular visual blurring. His symptoms began on Monday at 11 AM when he felt a pop in the back of his head. No associated confusion, difficulty speaking, limb weakness or limb numbness. He was on anticoagulation for a period of time after the sinus thrombosis diagnosis 2 years ago, then was taken off this medication.      SUBJECTIVE (INTERVAL HISTORY) His mom and wife are at the bedside.  He admitted that he came back from [facility] Sunday and drove for 14 hours. He was found to have LE DVT. He was here 2 years ago for likely left transverse sinus venous thrombosis and was on Xarelto for 6 months. 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 CTV pending.    OBJECTIVE Temp:  [98.1 F (36.7 C)-98.4 F (36.9 C)] 98.1 F (36.7 C) (05/22 1215) Pulse Rate:  [61-85] 69 (05/22 1215) Cardiac Rhythm: Normal sinus rhythm (05/22 0700) Resp:  [16-20] 20 (05/22 1215) BP: (100-133)/(45-74) 130/69 (05/22 1215) SpO2:  [94 %-99 %] 97 % (05/22 1215) FiO2 (%):  [0 %] 0 % (05/21 2345) Weight:  [156.5 kg (345 lb)-181.9 kg (401 lb)] 156.5 kg (345 lb) (05/22 1200)  CBC:  Recent Labs Lab 05/31/16 2132 05/31/16 2145  WBC 9.2  --   NEUTROABS 5.3  --   HGB 14.8 14.6  HCT 43.8 43.0  MCV 86.9  --   PLT 168  --     Basic Metabolic Panel:  Recent Labs Lab 05/31/16 2132 05/31/16 2145  NA 136 139  K 3.6 3.8  CL 101 101  CO2 26  --   GLUCOSE 127* 127*  BUN 13 15  CREATININE 0.92 0.80  CALCIUM 9.0  --     Lipid Panel:    Component Value  Date/Time   CHOL 227 (H) 06/01/2014 0448   TRIG 277 (H) 06/01/2014 0448   HDL 22 (L) 06/01/2014 0448   CHOLHDL 10.3 06/01/2014 0448   VLDL 55 (H) 06/01/2014 0448   LDLCALC 150 (H) 06/01/2014 0448   HgbA1c:  Lab Results  Component Value Date   HGBA1C 8.9 (H) 06/01/2014   Urine Drug Screen:    Component Value Date/Time   LABOPIA NONE DETECTED 05/31/2014 2331   COCAINSCRNUR NONE DETECTED 05/31/2014 2331   LABBENZ NONE DETECTED 05/31/2014 2331   AMPHETMU NONE DETECTED 05/31/2014 2331   THCU NONE DETECTED 05/31/2014 2331   LABBARB NONE DETECTED 05/31/2014 2331    Alcohol Level No results found for: ETH  IMAGING I have personally reviewed the radiological images below and agree with the radiology interpretations.  Ct Head Wo Contrast 05/31/2016 No acute intracranial abnormality identified. Unremarkable CT of the head.   Mr Angiogram Head Wo Contrast Mr Lodema Pilot Contrast Mr Mrv Head Wo Cm 06/01/2016 1. Moderately degraded exam due to motion artifact and body habitus.  2. Normal contrast-enhanced MRI of the brain.  3. Normal MRA of the brain.  4. Small tubular nonocclusive filling defect within the left transverse sinus, concerning for possible recurrent and/or residual dural sinus thrombosis.  CTV pending  TTE pending  Carotid Duplex (Doppler)  Findings suggest 1-39% internal carotid artery stenosis bilaterally. Vertebral arteries are patent with antegrade flow.  Bilateral lower extremity venous duplex  The right lower extremity is negative for deep vein thrombosis. The left lower extremity is positive for acute deep vein thrombosis involving the left mid and distal femoral vein. There is no evidence of Baker's cyst bilaterally.   PHYSICAL EXAM  Temp:  [98.1 F (36.7 C)-98.4 F (36.9 C)] 98.1 F (36.7 C) (05/22 1505) Pulse Rate:  [61-85] 68 (05/22 1505) Resp:  [16-20] 20 (05/22 1505) BP: (100-133)/(45-74) 115/62 (05/22 1505) SpO2:  [94 %-99 %] 96 % (05/22  1505) FiO2 (%):  [0 %] 0 % (05/21 2345) Weight:  [345 lb (156.5 kg)-401 lb (181.9 kg)] 345 lb (156.5 kg) (05/22 1200)  General - Well nourished, well developed, in no apparent distress.  Ophthalmologic - Fundi not visualized due to eye movement.  Cardiovascular - Regular rate and rhythm.  Mental Status -  Level of arousal and orientation to time, place, and person were intact. Language including expression, naming, repetition, comprehension was assessed and found intact. Attention span and concentration were normal. Fund of Knowledge was assessed and was intact.  Cranial Nerves II - XII - II - Visual field intact OU. III, IV, VI - Extraocular movements intact. V - Facial sensation intact bilaterally. VII - Facial movement intact bilaterally. VIII - Hearing & vestibular intact bilaterally. X - Palate elevates symmetrically. XI - Chin turning & shoulder shrug intact bilaterally. XII - Tongue protrusion intact.  Motor Strength - The patient's strength was normal in all extremities and pronator drift was absent.  Bulk was normal and fasciculations were absent.   Motor Tone - Muscle tone was assessed at the neck and appendages and was normal.  Reflexes - The patient's reflexes were 1+ in all extremities and he had no pathological reflexes.  Sensory - Light touch, temperature/pinprick, vibration and proprioception, and Romberg testing were assessed and were symmetrical.    Coordination - The patient had normal movements in the hands and feet with no ataxia or dysmetria.  Tremor was absent.  Gait and Station - The patient's transfers, posture, gait, station, and turns were observed as normal.    ASSESSMENT/PLAN [name] is a 46 y.o. male with history of a previous stroke (Lt transverse venous thrombosis May 2016 D/C'd on Xarelto x 3 mos) and diabetes mellitus, presenting with a left-sided headache, visual changes, and sensory changes on the left. He did not receive IV  t-PA due to sinus thrombosis.  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  MRI head - normal  MRA head - normal  MRV - Small tubular nonocclusive filling defect within the left transverse sinus vs. Residue old thrombosis.  CTV head pending  LE venous Dopplers - LLE positive for acute DVT involving the left mid and distal femoral vein.   Carotid Doppler - unremarkable  2D Echo  - pending  LDL - pending  HgbA1c - pending  Hypercoagulable work up in the past was negative  No concerning for malignancy due to obesity, no specific complains, and has been 2 years since last thrombosis  VTE prophylaxis - Xarelto  Diet heart healthy/carb modified Room service appropriate? Yes; Fluid consistency: Thin  No antithrombotic prior to admission, now on Xarelto (rivaroxaban) for DVT treatment.  Patient counseled to be compliant with his antithrombotic medications  Ongoing aggressive  stroke risk factor management  Therapy recommendations: pending  Disposition: Pending  LLE DVT  Pt had 14 hour long ride over weekend  LE venous doppler confirmed LLE DVT  On Xarelto for anticoagulation  However, due to recurrent venous thrombosis, pt will need life long anticoagulation  Hx of left transverse sinus thrombosis   05/2014 pt admitted with similar symptoms, left neck and post auricular pain  Found to have left transverse sinus thrombosis  On Xarelto for 6 months and then off  Had hypercoagulable work up - negative  Lost follow up in clinic with neurology  Hyperlipidemia  Home meds:  Zetia and Zocor resumed in hospital  LDL pending, goal < 70  Continue statin and zetia at discharge  Diabetes  HgbA1c pending, goal < 7.0  Controlled  SSI  CBG monitoring  Other Stroke Risk Factors  Former cigarette smoker - quit  Obesity, Body mass index is 45.52 kg/m., recommend weight loss, diet and exercise as  appropriate   Other Active Problems    Hospital day # 0  Marvel Plan, MD PhD Stroke Neurology 06/01/2016 4:21 PM   To contact Stroke Continuity provider, please refer to [email or web address]. After hours, contact General Neurology

## 2016-06-01 NOTE — Progress Notes (Signed)
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 Patient was hospitalized for further management. Due to his previous history. Patient was on anticoagulation. 2 years ago (Xarelto) and took it for 6 months.  Reason for Visit: Possible cerebral venous thrombosis  Consultants: Neurology  Procedures:  Lower extremity venous Doppler The right lower extremity is negative for deep vein thrombosis. The left lower extremity is positive for acute deep vein thrombosis involving the left mid and distal femoral vein. There is no evidence of Baker's cyst bilaterally.  Carotid Doppler Findings suggest 1-39% internal carotid artery stenosis bilaterally. Vertebral arteries are patent with antegrade flow.   Antibiotics: None  Subjective/Interval History: Patient continues to have left-sided headache. Although was not quite as severe as before. Denies any nausea, vomiting. Denies any visual disturbances. Denies any weakness in his arms or legs.  ROS: No chest pain or shortness of breath  Objective:  Vital Signs  Vitals:   06/01/16 0830 06/01/16 1030 06/01/16 1200 06/01/16 1215  BP: (!) 102/45 (!) 100/52  130/69  Pulse: 61 61  69  Resp: 18 18  20   Temp: 98.2 F (36.8 C) 98.4 F (36.9 C)  98.1 F (36.7 C)  TempSrc: Oral Oral  Oral  SpO2: 99% 99%  97%  Weight:   (!) 156.5 kg (345 lb)   Height:        Intake/Output Summary (Last 24 hours) at 06/01/16 1223 Last data filed at 06/01/16 8119  Gross per 24 hour  Intake              500 ml  Output                0 ml  Net              500 ml   Filed Weights   05/31/16 2159 06/01/16 0227 06/01/16 1200    Weight: (!) 158.8 kg (350 lb) (!) 181.9 kg (401 lb) (!) 156.5 kg (345 lb)    General appearance: alert, cooperative, appears stated age and no distress Resp: clear to auscultation bilaterally Cardio: regular rate and rhythm, S1, S2 normal, no murmur, click, rub or gallop GI: soft, non-tender; bowel sounds normal; no masses,  no organomegaly Extremities: extremities normal, atraumatic, no cyanosis or edema Neurologic: Awake and alert. Oriented 3. Cranial nerves 2- 12 intact. Motor strength equal bilateral upper and lower extremities. No pronator drift.  Lab Results:  Data Reviewed: I have personally reviewed following labs and imaging studies  CBC:  Recent Labs Lab 05/31/16 2132 05/31/16 2145  WBC 9.2  --   NEUTROABS 5.3  --   HGB 14.8 14.6  HCT 43.8 43.0  MCV 86.9  --   PLT 168  --     Basic Metabolic Panel:  Recent Labs Lab 05/31/16 2132 05/31/16 2145  NA 136 139  K 3.6 3.8  CL 101 101  CO2 26  --   GLUCOSE 127* 127*  BUN 13 15  CREATININE 0.92 0.80  CALCIUM 9.0  --     GFR: Estimated Creatinine Clearance: 182.2 mL/min (by C-G formula based on SCr of 0.8 mg/dL).  Liver Function Tests:  Recent Labs Lab 05/31/16 2132  AST 30  ALT 38  ALKPHOS 73  BILITOT 0.6  PROT 7.2  ALBUMIN 4.1    Coagulation Profile:  Recent Labs Lab 05/31/16 2132  INR 0.98   CBG:  Recent Labs Lab 05/31/16 2153 06/01/16 0136 06/01/16 0643 06/01/16 1146  GLUCAP 128* 149* 138* 119*     Radiology Studies: Ct Head Wo Contrast  Result Date: 05/31/2016 CLINICAL DATA:  46 y/o  M; tingling sensation to left side of head. EXAM: CT HEAD WITHOUT CONTRAST TECHNIQUE: Contiguous axial images were obtained from the base of the skull through the vertex without intravenous contrast. COMPARISON:  05/31/2014 CT head. FINDINGS: Brain: No evidence of acute infarction, hemorrhage, hydrocephalus, extra-axial collection or mass lesion/mass effect. Vascular: No hyperdense vessel or  unexpected calcification. Skull: Normal. Negative for fracture or focal lesion. Sinuses/Orbits: No acute finding. Other: None. IMPRESSION: No acute intracranial abnormality identified. Unremarkable CT of the head. Electronically Signed   By: Mitzi Hansen M.D.   On: 05/31/2016 21:50   Mr Angiogram Head Wo Contrast  Result Date: 06/01/2016 CLINICAL DATA:  Initial evaluation for acute left facial numbness. EXAM: MRI HEAD WITHOUT AND WITH CONTRAST MRA HEAD WITHOUT CONTRAST MRV HEAD WITHOUT CONTRAST TECHNIQUE: Multiplanar, multiecho pulse sequences of the brain and surrounding structures were obtained without and with intravenous contrast. Angiographic images of the intracranial venous structures were obtained using MRV technique without intravenous contrast. CONTRAST:  20mL MULTIHANCE GADOBENATE DIMEGLUMINE 529 MG/ML IV SOLN COMPARISON:  Prior CT from 05/31/2016. FINDINGS: MRI HEAD: Study degraded by motion artifact and body habitus. Cerebral volume normal. No focal parenchymal signal abnormality. No significant cerebral white matter disease. No abnormal foci of restricted diffusion to suggest acute or subacute ischemia. Gray-white matter differentiation maintained. No areas of chronic infarction. No evidence for acute or chronic intracranial hemorrhage. No mass lesion, midline shift or mass effect. No hydrocephalus. No extra-axial fluid collection. No abnormal enhancement. Pituitary gland and suprasellar region within normal limits. Midline structures intact and normal. Major intracranial vascular flow voids maintained. Craniocervical junction normal. Bone marrow signal intensity normal. No scalp soft tissue abnormality. Globes and orbital soft tissues within normal limits. Retention cyst noted within the left maxillary sinus. Scattered mucosal thickening within the ethmoidal air cells. Paranasal sinuses are otherwise clear. No mastoid effusion. Inner ear structures grossly normal. MRA HEAD: ANTERIOR  CIRCULATION: Study degraded by motion artifact and body habitus. Distal cervical segments of the internal carotid arteries are patent with antegrade flow. Petrous, cavernous, supraclinoid segments patent without flow-limiting stenosis. A1 segments patent bilaterally. Anterior communicating artery normal. Anterior cerebral arteries patent to their distal aspects. M1 segments patent without stenosis or occlusion. No proximal M2 occlusion. Distal MCA branches well opacified and symmetric bilaterally. POSTERIOR CIRCULATION: Vertebral arteries patent to the vertebrobasilar junction. Left vertebral artery dominant. Left PICA patent proximally. Right PICA not visualized. Basilar artery widely patent to its distal aspect. Superior cerebellar and posterior cerebral arteries patent bilaterally. Small left posterior communicating artery noted. No aneurysm or vascular malformation. MRV HEAD: Study degraded by motion artifact and body habitus. Superior sagittal sinus widely patent. Right transverse and sigmoid sinuses are patent as is the proximal right internal jugular vein. Straight sinus, vein of Galen, and internal cerebral veins are patent. There is persistent tubular nonocclusive filling defect within the lateral left transverse sinus, which may reflect persistent and/or current dural sinus thrombosis (series 9, image 31). This is relatively similar as compared to previous MRV. IMPRESSION: 1. Moderately degraded exam due to motion artifact and body habitus. 2. Normal contrast-enhanced  MRI of the brain. 3. Normal MRA of the brain. 4. Small tubular nonocclusive filling defect within the left transverse sinus, concerning for possible recurrent and/or residual dural sinus thrombosis. Electronically Signed   By: Rise Mu M.D.   On: 06/01/2016 06:54   Mr Laqueta Jean MW Contrast  Result Date: 06/01/2016 CLINICAL DATA:  Initial evaluation for acute left facial numbness. EXAM: MRI HEAD WITHOUT AND WITH CONTRAST MRA HEAD  WITHOUT CONTRAST MRV HEAD WITHOUT CONTRAST TECHNIQUE: Multiplanar, multiecho pulse sequences of the brain and surrounding structures were obtained without and with intravenous contrast. Angiographic images of the intracranial venous structures were obtained using MRV technique without intravenous contrast. CONTRAST:  44mL MULTIHANCE GADOBENATE DIMEGLUMINE 529 MG/ML IV SOLN COMPARISON:  Prior CT from 05/31/2016. FINDINGS: MRI HEAD: Study degraded by motion artifact and body habitus. Cerebral volume normal. No focal parenchymal signal abnormality. No significant cerebral white matter disease. No abnormal foci of restricted diffusion to suggest acute or subacute ischemia. Gray-white matter differentiation maintained. No areas of chronic infarction. No evidence for acute or chronic intracranial hemorrhage. No mass lesion, midline shift or mass effect. No hydrocephalus. No extra-axial fluid collection. No abnormal enhancement. Pituitary gland and suprasellar region within normal limits. Midline structures intact and normal. Major intracranial vascular flow voids maintained. Craniocervical junction normal. Bone marrow signal intensity normal. No scalp soft tissue abnormality. Globes and orbital soft tissues within normal limits. Retention cyst noted within the left maxillary sinus. Scattered mucosal thickening within the ethmoidal air cells. Paranasal sinuses are otherwise clear. No mastoid effusion. Inner ear structures grossly normal. MRA HEAD: ANTERIOR CIRCULATION: Study degraded by motion artifact and body habitus. Distal cervical segments of the internal carotid arteries are patent with antegrade flow. Petrous, cavernous, supraclinoid segments patent without flow-limiting stenosis. A1 segments patent bilaterally. Anterior communicating artery normal. Anterior cerebral arteries patent to their distal aspects. M1 segments patent without stenosis or occlusion. No proximal M2 occlusion. Distal MCA branches well opacified  and symmetric bilaterally. POSTERIOR CIRCULATION: Vertebral arteries patent to the vertebrobasilar junction. Left vertebral artery dominant. Left PICA patent proximally. Right PICA not visualized. Basilar artery widely patent to its distal aspect. Superior cerebellar and posterior cerebral arteries patent bilaterally. Small left posterior communicating artery noted. No aneurysm or vascular malformation. MRV HEAD: Study degraded by motion artifact and body habitus. Superior sagittal sinus widely patent. Right transverse and sigmoid sinuses are patent as is the proximal right internal jugular vein. Straight sinus, vein of Galen, and internal cerebral veins are patent. There is persistent tubular nonocclusive filling defect within the lateral left transverse sinus, which may reflect persistent and/or current dural sinus thrombosis (series 9, image 31). This is relatively similar as compared to previous MRV. IMPRESSION: 1. Moderately degraded exam due to motion artifact and body habitus. 2. Normal contrast-enhanced MRI of the brain. 3. Normal MRA of the brain. 4. Small tubular nonocclusive filling defect within the left transverse sinus, concerning for possible recurrent and/or residual dural sinus thrombosis. Electronically Signed   By: Rise Mu M.D.   On: 06/01/2016 06:54   Mr Mrv Head Wo Cm  Result Date: 06/01/2016 CLINICAL DATA:  Initial evaluation for acute left facial numbness. EXAM: MRI HEAD WITHOUT AND WITH CONTRAST MRA HEAD WITHOUT CONTRAST MRV HEAD WITHOUT CONTRAST TECHNIQUE: Multiplanar, multiecho pulse sequences of the brain and surrounding structures were obtained without and with intravenous contrast. Angiographic images of the intracranial venous structures were obtained using MRV technique without intravenous contrast. CONTRAST:  8mL MULTIHANCE GADOBENATE DIMEGLUMINE 529 MG/ML IV  SOLN COMPARISON:  Prior CT from 05/31/2016. FINDINGS: MRI HEAD: Study degraded by motion artifact and body  habitus. Cerebral volume normal. No focal parenchymal signal abnormality. No significant cerebral white matter disease. No abnormal foci of restricted diffusion to suggest acute or subacute ischemia. Gray-white matter differentiation maintained. No areas of chronic infarction. No evidence for acute or chronic intracranial hemorrhage. No mass lesion, midline shift or mass effect. No hydrocephalus. No extra-axial fluid collection. No abnormal enhancement. Pituitary gland and suprasellar region within normal limits. Midline structures intact and normal. Major intracranial vascular flow voids maintained. Craniocervical junction normal. Bone marrow signal intensity normal. No scalp soft tissue abnormality. Globes and orbital soft tissues within normal limits. Retention cyst noted within the left maxillary sinus. Scattered mucosal thickening within the ethmoidal air cells. Paranasal sinuses are otherwise clear. No mastoid effusion. Inner ear structures grossly normal. MRA HEAD: ANTERIOR CIRCULATION: Study degraded by motion artifact and body habitus. Distal cervical segments of the internal carotid arteries are patent with antegrade flow. Petrous, cavernous, supraclinoid segments patent without flow-limiting stenosis. A1 segments patent bilaterally. Anterior communicating artery normal. Anterior cerebral arteries patent to their distal aspects. M1 segments patent without stenosis or occlusion. No proximal M2 occlusion. Distal MCA branches well opacified and symmetric bilaterally. POSTERIOR CIRCULATION: Vertebral arteries patent to the vertebrobasilar junction. Left vertebral artery dominant. Left PICA patent proximally. Right PICA not visualized. Basilar artery widely patent to its distal aspect. Superior cerebellar and posterior cerebral arteries patent bilaterally. Small left posterior communicating artery noted. No aneurysm or vascular malformation. MRV HEAD: Study degraded by motion artifact and body habitus. Superior  sagittal sinus widely patent. Right transverse and sigmoid sinuses are patent as is the proximal right internal jugular vein. Straight sinus, vein of Galen, and internal cerebral veins are patent. There is persistent tubular nonocclusive filling defect within the lateral left transverse sinus, which may reflect persistent and/or current dural sinus thrombosis (series 9, image 31). This is relatively similar as compared to previous MRV. IMPRESSION: 1. Moderately degraded exam due to motion artifact and body habitus. 2. Normal contrast-enhanced MRI of the brain. 3. Normal MRA of the brain. 4. Small tubular nonocclusive filling defect within the left transverse sinus, concerning for possible recurrent and/or residual dural sinus thrombosis. Electronically Signed   By: Rise Mu M.D.   On: 06/01/2016 06:54     Medications:  Scheduled: . aspirin  300 mg Rectal Daily   Or  . aspirin  325 mg Oral Daily  . enoxaparin (LOVENOX) injection  40 mg Subcutaneous QHS  . ezetimibe  10 mg Oral Daily  . insulin aspart  0-9 Units Subcutaneous TID WC  . iopamidol      . simvastatin  20 mg Oral QHS   Continuous: . 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 Neurology is following. MR venogram brain was inconclusive. CT venogram has been ordered. Patient to be started on anticoagulation due to presence of DVT. Patient had similar episode about 2 years ago and was on Xarelto for 6 months at that time. He will likely need lifelong anticoagulation. He did undergo hypercoagulable workup in 2016, which was negative.  Acute DVT Left  lower extremity Patient to be started on anticoagulation. He took a road trip from [place] recently.  Diabetes mellitus type 2 Continue sliding scale coverage. Holding metformin.  History of hyperlipidemia. Continue statin  DVT Prophylaxis: Currently on Lovenox    Code Status: Full code  Family Communication: Discussed with the patient  Disposition Plan: Management as outlined above.    LOS: 0 days   [facility]  [facility] Pager [phone] 06/01/2016, 12:23 PM  If 7PM-7AM, please contact night-coverage at [email or web address], password [facility]

## 2016-06-01 NOTE — Progress Notes (Signed)
*  PRELIMINARY RESULTS* Vascular Ultrasound Carotid Duplex (Doppler) has been completed.  Findings suggest 1-39% internal carotid artery stenosis bilaterally. Vertebral arteries are patent with antegrade flow.   Bilateral lower extremity venous duplex completed. The right lower extremity is negative for deep vein thrombosis. The left lower extremity is positive for acute deep vein thrombosis involving the left mid and distal femoral vein. There is no evidence of Baker's cyst bilaterally.  Preliminary results discussed with Dr. Roda Shutters.  06/01/2016 11:31 AM Gertie Fey, BS, RVT, RDCS, RDMS

## 2016-06-01 NOTE — Discharge Instructions (Addendum)
Xarelto 15mg  twice daily x 21 days (through 06/22/16) then, Start Xarelto 20mg  QD with supper on 06/23/16 Monitor renal function and for signs and symptoms of bleeding closely with primary care physician   Information on my medicine - XARELTO (rivaroxaban)  This medication education was reviewed with me or my healthcare representative as part of my discharge preparation.    WHY WAS XARELTO PRESCRIBED FOR YOU? Xarelto was prescribed to treat blood clots that may have been found in the veins of your legs (deep vein thrombosis) or in your lungs (pulmonary embolism) and to reduce the risk of them occurring again.  What do you need to know about Xarelto? The starting dose is one 15 mg tablet taken TWICE daily with food for the FIRST 21 DAYS then on (enter date)  06/22/16 the dose is changed to one 20 mg tablet taken ONCE A DAY with your evening meal.  DO NOT stop taking Xarelto without talking to the health care provider who prescribed the medication.  Refill your prescription for 20 mg tablets before you run out.  After discharge, you should have regular check-up appointments with your healthcare provider that is prescribing your Xarelto.  In the future your dose may need to be changed if your kidney function changes by a significant amount.  What do you do if you miss a dose? If you are taking Xarelto TWICE DAILY and you miss a dose, take it as soon as you remember. You may take two 15 mg tablets (total 30 mg) at the same time then resume your regularly scheduled 15 mg twice daily the next day.  If you are taking Xarelto ONCE DAILY and you miss a dose, take it as soon as you remember on the same day then continue your regularly scheduled once daily regimen the next day. Do not take two doses of Xarelto at the same time.   Important Safety Information Xarelto is a blood thinner medicine that can cause bleeding. You should call your healthcare provider right away if you experience any of  the following: ? Bleeding from an injury or your nose that does not stop. ? Unusual colored urine (red or dark brown) or unusual colored stools (red or black). ? Unusual bruising for unknown reasons. ? A serious fall or if you hit your head (even if there is no bleeding).  Some medicines may interact with Xarelto and might increase your risk of bleeding while on Xarelto. To help avoid this, consult your healthcare provider or pharmacist prior to using any new prescription or non-prescription medications, including herbals, vitamins, non-steroidal anti-inflammatory drugs (NSAIDs) and supplements.  This website has more information on Xarelto: [email or web address].com.br.

## 2016-06-01 NOTE — Evaluation (Addendum)
Occupational Therapy Evaluation/Discharge Patient Details Name: [name] Holland MRN: [id number] DOB: [birth year] Today's Date: 06/01/2016    History of Present Illness Patient is a 46 yo male who presents with Left sided headache with left facial numbness, left monocular visual blurring and left facial/scalp paresthesia. Neuro workup underway   Clinical Impression   PTA, pt was independent with ADL and functional mobility and working full-time. Pt currently demonstrates ability to complete all ADL in hospital setting independently. Pt demonstrates good dynamic balance, cognition, functional use of vision, and strength as related to ADL participation. Educated pt and family concerning safety post-acute D/C and they verbalize understanding. No further acute OT needs identified and OT will sign off.     Follow Up Recommendations  No OT follow up    Equipment Recommendations  None recommended by OT    Recommendations for Other Services       Precautions / Restrictions Precautions Precautions: None Restrictions Weight Bearing Restrictions: No      Mobility Bed Mobility Overal bed mobility: Independent                Transfers Overall transfer level: Independent                    Balance Overall balance assessment: No apparent balance deficits (not formally assessed)                                    ADL either performed or assessed with clinical judgement   ADL Overall ADL's : Independent                                       General ADL Comments: Completing all ADL tasks independently in hospital setting.      Vision Baseline Vision/History: No visual deficits Patient Visual Report: No change from baseline Vision Assessment?: No apparent visual deficits     Perception     Praxis      Pertinent Vitals/Pain Pain Assessment: 0-10 Pain Score: 1  Pain Location: L side of neck/head Pain Descriptors / Indicators:  Aching;Headache Pain Intervention(s): Monitored during session     Hand Dominance Right   Extremity/Trunk Assessment Upper Extremity Assessment Upper Extremity Assessment: Overall WFL for tasks assessed   Lower Extremity Assessment Lower Extremity Assessment: Overall WFL for tasks assessed       Communication Communication Communication: No difficulties   Cognition Arousal/Alertness: Awake/alert Behavior During Therapy: WFL for tasks assessed/performed Overall Cognitive Status: Within Functional Limits for tasks assessed                                     General Comments       Exercises     Shoulder Instructions      Home Living Family/patient expects to be discharged to:: Private residence Living Arrangements: Spouse/significant other Available Help at Discharge: Family Type of Home: House Home Access: Stairs to enter Secretary/administrator of Steps: 3   Home Layout: One level     Bathroom Shower/Tub: Chief Strategy Officer: Standard     Home Equipment: None          Prior Functioning/Environment Level of Independence: Independent  OT Problem List: Pain      OT Treatment/Interventions:      OT Goals(Current goals can be found in the care plan section) Acute Rehab OT Goals Patient Stated Goal: to go home OT Goal Formulation: With patient/family Time For Goal Achievement: 06/15/16 Potential to Achieve Goals: Good  OT Frequency:     Barriers to D/C:            Co-evaluation              AM-PAC PT "6 Clicks" Daily Activity     Outcome Measure Help from another person eating meals?: None Help from another person taking care of personal grooming?: None Help from another person toileting, which includes using toliet, bedpan, or urinal?: None Help from another person bathing (including washing, rinsing, drying)?: None Help from another person to put on and taking off regular upper body  clothing?: None Help from another person to put on and taking off regular lower body clothing?: None 6 Click Score: 24   End of Session Nurse Communication: Mobility status  Activity Tolerance: Patient tolerated treatment well Patient left: Other (comment) (Ambulating with PT in hallway)  OT Visit Diagnosis: Pain Pain - Right/Left: Left Pain - part of body:  (neck)                Time: 1610-9604 OT Time Calculation (min): 10 min Charges:  OT General Charges $OT Visit: 1 Procedure OT Evaluation $OT Eval Low Complexity: 1 Procedure G-Codes: OT G-codes **NOT FOR INPATIENT CLASS** Functional Assessment Tool Used: AM-PAC 6 Clicks Daily Activity Functional Limitation: Self care Self Care Current Status (V4098): 0 percent impaired, limited or restricted Self Care Goal Status (J1914): 0 percent impaired, limited or restricted Self Care Discharge Status (N8295): 0 percent impaired, limited or restricted   Doristine Section, MS OTR/L  Pager: [phone]   Llesenia Fogal A Elaya Droege 06/01/2016, 10:50 AM

## 2016-06-01 NOTE — Care Management Note (Signed)
Case Management Note  Patient Details  Name: [name] Holland MRN: [id number] Date of Birth: [birth year]  Subjective/Objective:    Pt in with lt facial numbness. He is from home with his spouse.                Action/Plan: No f/u per PT/OT and no DME needs. CM following for further d/c needs, physician orders.   Expected Discharge Date:                  Expected Discharge Plan:  Home/Self Care  In-House Referral:     Discharge planning Services     Post Acute Care Choice:    Choice offered to:     DME Arranged:    DME Agency:     HH Arranged:    HH Agency:     Status of Service:  In process, will continue to follow  If discussed at Long Length of Stay Meetings, dates discussed:    Additional Comments:  [name], RN 06/01/2016, 11:18 AM

## 2016-06-02 ENCOUNTER — Encounter (HOSPITAL_COMMUNITY): Payer: Self-pay | Admitting: Family Medicine

## 2016-06-02 DIAGNOSIS — E1149 Type 2 diabetes mellitus with other diabetic neurological complication: Secondary | ICD-10-CM | POA: Diagnosis not present

## 2016-06-02 DIAGNOSIS — R200 Anesthesia of skin: Principal | ICD-10-CM

## 2016-06-02 DIAGNOSIS — I82412 Acute embolism and thrombosis of left femoral vein: Secondary | ICD-10-CM

## 2016-06-02 DIAGNOSIS — E785 Hyperlipidemia, unspecified: Secondary | ICD-10-CM | POA: Diagnosis not present

## 2016-06-02 LAB — COMPREHENSIVE METABOLIC PANEL
ALT: 34 U/L (ref 17–63)
AST: 20 U/L (ref 15–41)
Albumin: 3.6 g/dL (ref 3.5–5.0)
Alkaline Phosphatase: 76 U/L (ref 38–126)
Anion gap: 8 (ref 5–15)
BUN: 8 mg/dL (ref 6–20)
CALCIUM: 8.7 mg/dL — AB (ref 8.9–10.3)
CHLORIDE: 106 mmol/L (ref 101–111)
CO2: 26 mmol/L (ref 22–32)
CREATININE: 0.72 mg/dL (ref 0.61–1.24)
GFR calc Af Amer: >60 mL/min (ref >60)
GFR calc non Af Amer: >60 mL/min (ref >60)
GLUCOSE: 135 mg/dL — AB (ref 65–99)
Potassium: 4.1 mmol/L (ref 3.5–5.1)
SODIUM: 140 mmol/L (ref 135–145)
Total Bilirubin: 0.4 mg/dL (ref 0.3–1.2)
Total Protein: 6.6 g/dL (ref 6.5–8.1)

## 2016-06-02 LAB — HIV ANTIBODY (ROUTINE TESTING W REFLEX): HIV Screen 4th Generation wRfx: NONREACTIVE

## 2016-06-02 LAB — CBC
HCT: 43.3 % (ref 39.0–52.0)
HEMOGLOBIN: 14.1 g/dL (ref 13.0–17.0)
MCH: 28.8 pg (ref 26.0–34.0)
MCHC: 32.6 g/dL (ref 30.0–36.0)
MCV: 88.4 fL (ref 78.0–100.0)
Platelets: 136 10*3/uL — ABNORMAL LOW (ref 150–400)
RBC: 4.9 MIL/uL (ref 4.22–5.81)
RDW: 13.9 % (ref 11.5–15.5)
WBC: 6.9 10*3/uL (ref 4.0–10.5)

## 2016-06-02 LAB — LIPID PANEL
CHOL/HDL RATIO: 7 ratio
Cholesterol: 197 mg/dL (ref 0–200)
HDL: 28 mg/dL — ABNORMAL LOW (ref >40)
LDL CALC: 138 mg/dL — AB (ref 0–99)
Triglycerides: 157 mg/dL — ABNORMAL HIGH (ref <150)
VLDL: 31 mg/dL (ref 0–40)

## 2016-06-02 LAB — GLUCOSE, CAPILLARY
GLUCOSE-CAPILLARY: 139 mg/dL — AB (ref 65–99)
Glucose-Capillary: 137 mg/dL — ABNORMAL HIGH (ref 65–99)

## 2016-06-02 MED ORDER — RIVAROXABAN (XARELTO) VTE STARTER PACK (15 & 20 MG): ORAL_TABLET | ORAL | 0 refills | Status: DC

## 2016-06-02 NOTE — Progress Notes (Signed)
Discharge instructions reviewed with patient. All questions answered at this time. Transport home by family.   Kysa Calais, RN 

## 2016-06-02 NOTE — Progress Notes (Signed)
STROKE TEAM PROGRESS NOTE   SUBJECTIVE (INTERVAL HISTORY) No family is at the bedside. CTV no acute finding. Tolerating Xarelto so far. No complains over night.     OBJECTIVE Temp:  [97.8 F (36.6 C)-99.1 F (37.3 C)] 97.8 F (36.6 C) (05/23 0458) Pulse Rate:  [61-84] 65 (05/23 0458) Cardiac Rhythm: Normal sinus rhythm (05/22 1900) Resp:  [18-20] 18 (05/23 0458) BP: (100-130)/(45-71) 120/62 (05/23 0458) SpO2:  [95 %-99 %] 98 % (05/23 0458) Weight:  [156.5 kg (345 lb)] 156.5 kg (345 lb) (05/22 1200)  CBC:   Recent Labs Lab 05/31/16 2132 05/31/16 2145  WBC 9.2  --   NEUTROABS 5.3  --   HGB 14.8 14.6  HCT 43.8 43.0  MCV 86.9  --   PLT 168  --     Basic Metabolic Panel:   Recent Labs Lab 05/31/16 2132 05/31/16 2145  NA 136 139  K 3.6 3.8  CL 101 101  CO2 26  --   GLUCOSE 127* 127*  BUN 13 15  CREATININE 0.92 0.80  CALCIUM 9.0  --     Lipid Panel:     Component Value Date/Time   CHOL 227 (H) 06/01/2014 0448   TRIG 277 (H) 06/01/2014 0448   HDL 22 (L) 06/01/2014 0448   CHOLHDL 10.3 06/01/2014 0448   VLDL 55 (H) 06/01/2014 0448   LDLCALC 150 (H) 06/01/2014 0448   HgbA1c:  Lab Results  Component Value Date   HGBA1C 8.9 (H) 06/01/2014   Urine Drug Screen:     Component Value Date/Time   LABOPIA NONE DETECTED 05/31/2014 2331   COCAINSCRNUR NONE DETECTED 05/31/2014 2331   LABBENZ NONE DETECTED 05/31/2014 2331   AMPHETMU NONE DETECTED 05/31/2014 2331   THCU NONE DETECTED 05/31/2014 2331   LABBARB NONE DETECTED 05/31/2014 2331    Alcohol Level No results found for: ETH  IMAGING I have personally reviewed the radiological images below and agree with the radiology interpretations.  Ct Head Wo Contrast 05/31/2016 No acute intracranial abnormality identified. Unremarkable CT of the head.   Mr Angiogram Head Wo Contrast Mr Lodema Pilot Contrast Mr Mrv Head Wo Cm 06/01/2016 1. Moderately degraded exam due to motion artifact and body habitus.  2. Normal  contrast-enhanced MRI of the brain.  3. Normal MRA of the brain.  4. Small tubular nonocclusive filling defect within the left transverse sinus, concerning for possible recurrent and/or residual dural sinus thrombosis.   Ct Venogram Head 06/01/2016 IMPRESSION: Normal CT HEAD with and without contrast. 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   TEE - Left ventricle: The cavity size was normal. There was mild   concentric hypertrophy. Systolic function was normal. The   estimated ejection fraction was in the range of 55% to 60%. Wall   motion was normal; there were no regional wall motion   abnormalities. Left ventricular diastolic function parameters   were normal. - Left atrium: The atrium was mildly dilated.  Carotid Duplex (Doppler)  Findings suggest 1-39% internal carotid artery stenosis bilaterally. Vertebral arteries are patent with antegrade flow.  Bilateral lower extremity venous duplex  The right lower extremity is negative for deep vein thrombosis. The left lower extremity is positive for acute deep vein thrombosis involving the left mid and distal femoral vein. There is no evidence of Baker's cyst bilaterally.   PHYSICAL EXAM  Temp:  [97.8 F (36.6 C)-99.1 F (37.3 C)] 97.8 F (36.6 C) (05/23 0458) Pulse Rate:  [  61-84] 65 (05/23 0458) Resp:  [18-20] 18 (05/23 0458) BP: (100-130)/(45-71) 120/62 (05/23 0458) SpO2:  [95 %-99 %] 98 % (05/23 0458) Weight:  [156.5 kg (345 lb)] 156.5 kg (345 lb) (05/22 1200)  General - Well nourished, well developed, in no apparent distress.  Ophthalmologic - Fundi not visualized due to eye movement.  Cardiovascular - Regular rate and rhythm.  Mental Status -  Level of arousal and orientation to time, place, and person were intact. Language including expression, naming, repetition, comprehension was assessed and found intact. Attention span and concentration were  normal. Fund of Knowledge was assessed and was intact.  Cranial Nerves II - XII - II - Visual field intact OU. III, IV, VI - Extraocular movements intact. V - Facial sensation intact bilaterally. VII - Facial movement intact bilaterally. VIII - Hearing & vestibular intact bilaterally. X - Palate elevates symmetrically. XI - Chin turning & shoulder shrug intact bilaterally. XII - Tongue protrusion intact.  Motor Strength - The patient's strength was normal in all extremities and pronator drift was absent.  Bulk was normal and fasciculations were absent.   Motor Tone - Muscle tone was assessed at the neck and appendages and was normal.  Reflexes - The patient's reflexes were 1+ in all extremities and he had no pathological reflexes.  Sensory - Light touch, temperature/pinprick, vibration and proprioception, and Romberg testing were assessed and were symmetrical.    Coordination - The patient had normal movements in the hands and feet with no ataxia or dysmetria.  Tremor was absent.  Gait and Station - The patient's transfers, posture, gait, station, and turns were observed as normal.    ASSESSMENT/PLAN [name] is a 46 y.o. male with history of a previous stroke (Lt transverse venous thrombosis May 2016 D/C'd on Xarelto x 3 mos) and diabetes mellitus, presenting with a left-sided headache, visual changes, and sensory changes on the left. He did not receive IV t-PA due to sinus thrombosis.  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  MRI head - normal  MRA head - normal  MRV - Small tubular nonocclusive filling defect within the left transverse sinus vs. Residue old thrombosis.  CTV head no acute finding, old left transverse sinus thrombosis  LE venous Dopplers - LLE positive for acute DVT involving the left mid and distal femoral vein.   Carotid Doppler - unremarkable  2D Echo  EF 55-60%  LDL -  138  HgbA1c - pending  Hypercoagulable work up in the past was negative  No concerning for malignancy due to obesity, no specific complains, and has been 2 years since last thrombosis  VTE prophylaxis - Xarelto Diet heart healthy/carb modified Room service appropriate? Yes; Fluid consistency: Thin  No antithrombotic prior to admission, now on Xarelto (rivaroxaban) for DVT treatment.  Patient counseled to be compliant with his antithrombotic medications  Ongoing aggressive stroke risk factor management  Therapy recommendations: pending  Disposition: Pending  LLE DVT  Pt had 14 hour long ride over weekend  LE venous doppler confirmed LLE DVT  On Xarelto for anticoagulation  However, due to recurrent venous thrombosis, pt will need life long anticoagulation  Hx of left transverse sinus thrombosis   05/2014 pt admitted with similar symptoms, left neck and post auricular pain  Found to have left transverse sinus thrombosis  On Xarelto for 6 months and then off  Had hypercoagulable work up - negative  Lost follow up in  clinic with neurology  Hyperlipidemia  Home meds:  Zetia and Zocor resumed in hospital  LDL 138 goal < 70  Pt stated that he did not take zocor for the last 2 weeks when he was in [place]. He will be more compliant this time.  Continue statin and zetia at discharge  Diabetes  HgbA1c pending, goal < 7.0  Controlled  SSI  CBG monitoring  Other Stroke Risk Factors  Former cigarette smoker - quit  Obesity, Body mass index is 45.52 kg/m., recommend weight loss, diet and exercise as appropriate   Other Active Problems    Hospital day # 0  Neurology will sign off. Please call with questions. Pt will follow up with Darrol Angel at [facility] in about 6 weeks. Thanks for the consult.  Marvel Plan, MD PhD Stroke Neurology 06/02/2016 3:01 PM  To contact Stroke Continuity provider, please refer to [email or web address]. After hours, contact General  Neurology

## 2016-06-02 NOTE — Care Management Note (Signed)
Case Management Note  Patient Details  Name: [name] Holland MRN: [id number] Date of Birth: [birth year]  Subjective/Objective:                    Action/Plan: Pt discharging home on Xarelto. CM provided him the 30 day free card and the $10 co pay cards. No f/u per PT/OT and no DME needs. Pt sees Dr Mardelle Matte at [facility] for his PCP.  No further needs per CM.  Expected Discharge Date:                  Expected Discharge Plan:  Home/Self Care  In-House Referral:     Discharge planning Services  CM Consult  Post Acute Care Choice:    Choice offered to:     DME Arranged:    DME Agency:     HH Arranged:    HH Agency:     Status of Service:  Completed, signed off  If discussed at [employer] of Stay Meetings, dates discussed:    Additional Comments:  [name], RN 06/02/2016, 10:29 AM

## 2016-06-02 NOTE — Discharge Summary (Addendum)
Physician Discharge Summary  [name] Holland MVH:[id number] DOB: 1970-02-06 DOA: 05/31/2016  PCP: Asencion Partridge, MD ([facility])  Admit date: 05/31/2016 Discharge date: 06/02/2016  Admitted From: Home  Disposition:  Home   Recommendations for Outpatient Follow-up:  1. Follow up with PCP in 1-2 weeks and follow up with neurologist in 1 month 2. Please obtain BMP/CBC in one week 3. Please follow these Xarelto instructions below: Xarelto 15mg  twice daily x 21 days (through 06/22/16) then, Start Xarelto 20mg  QD with supper on 06/23/16 Monitor renal function and for signs and symptoms of bleeding closely with primary care physician  Discharge Condition: STABLE  CODE STATUS: FULL   Brief/Interim Summary: HPI: [name] Holland is a 46 y.o. 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 Patient's symptoms started yesterday morning. Numbness has improved but postauricular headache still persists. Denies any weakness of her upper or lower extremities. Denies any visual symptoms nausea vomiting difficulty speaking or swallowing.  ED Course: In the ER patient appears nonfocal but still complains of the mild numbness on the left side of the face with post auricular headache. CT head is unremarkable. On-call neurologist Dr. Otelia Limes was consulted and at this time requested transfer to [facility] and have MRI and MRV.  Procedures:  Lower extremity venous Doppler The right lower extremity is negative for deep vein thrombosis. The left lower extremity is positivefor acute deep vein thrombosis involving the left mid and distal femoral vein. There is no evidence of Baker's cyst bilaterally.  Carotid Doppler Findings suggest 1-39% internal carotid artery stenosis bilaterally. Vertebral arteries are patent with antegrade flow.  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 Neurology is following. MR venogram brain was inconclusive. CT venogram has been ordered. Patient to be started on anticoagulation due to presence of DVT. Patient had similar episode about 2 years ago and was on Xarelto for 6 months at that time. He will  need lifelong anticoagulation. He did undergo hypercoagulable workup in 2016, which was negative.  Acute DVT Left lower extremity Patient started on anticoagulation Xarelto.   He took a road trip from [place] recently. He should take Xarelto 15mg  twice daily x 21 days (through 06/22/16) then, Start Xarelto 20mg  QD with supper on 06/23/16  Diabetes mellitus type 2 Continue sliding scale coverage. Holding metformin in hospital but can resume on discharge.  PCP consider maximizing dose of metformin for full cardiovascular and diabetes benefits of the drug.   CBG (last 3)   Recent Labs  06/01/16 2101 06/02/16 0611 06/02/16 1143  GLUCAP 136* 139* 137*   History of hyperlipidemia. Continue statin  DVT Prophylaxis: Currently on Lovenox    Code Status: Full code  Family Communication: Discussed with the patient  Disposition Plan: Home   Discharge Diagnoses:  Principal Problem:   Left facial numbness Active Problems:   DM type 2 causing neurological disease, not at goal [facility])   HLD (hyperlipidemia)   Acute deep vein thrombosis (DVT) of femoral vein [facility])  Discharge Instructions  Discharge Instructions    Increase activity slowly    Complete by:  As directed      Allergies as of 06/02/2016   No Known Allergies     Medication List    TAKE these medications   ezetimibe 10 MG tablet Commonly known as:  ZETIA Take 1 tablet (10 mg total) by mouth daily.   metFORMIN 500 MG tablet Commonly known as:  GLUCOPHAGE Take 1 tablet (500 mg total) by mouth daily with breakfast. What changed:  when to take this   PENNSAID 2 % Soln Generic  drug:  Diclofenac Sodium Place 2 application onto the skin 2 (two) times daily. Pt applies to knees.   Rivaroxaban 15 & 20 MG Tbpk Commonly known as:  XARELTO STARTER PACK Take as directed on package: Start with one 15mg  tablet by mouth twice a day with food thru 6/12. On 6/13 switch to one 20mg  tablet once a day with food.   simvastatin 20 MG tablet Commonly known as:  ZOCOR Take 20 mg by mouth at bedtime.      Follow-up Information    Willow Ora, MD. Schedule an appointment as soon as possible for a visit in 2 week(s).   Specialty:  Family Medicine Contact information: [address] [place] [address] [phone]        Marvel Plan, MD. Schedule an appointment as soon as possible for a visit in 1 month(s).   Specialty:  Neurology Why:  Hospital Follow Up   Contact information: [address] [place] [address] [phone]          No Known Allergies  Procedures/Studies: Ct Head Wo Contrast  Result Date: 05/31/2016 CLINICAL DATA:  46 y/o  M; tingling sensation to left side of head. EXAM: CT HEAD WITHOUT CONTRAST TECHNIQUE: Contiguous axial images were obtained from the base of the skull through the vertex without intravenous contrast. COMPARISON:  05/31/2014 CT head. FINDINGS: Brain: No evidence of acute infarction, hemorrhage, hydrocephalus, extra-axial collection or mass lesion/mass effect. Vascular: No hyperdense vessel or unexpected calcification. Skull: Normal. Negative for fracture or focal lesion. Sinuses/Orbits: No acute finding. Other: None. IMPRESSION: No acute intracranial abnormality identified. Unremarkable CT of the head. Electronically Signed   By: Mitzi Hansen M.D.   On: 05/31/2016 21:50   Mr Angiogram Head Wo Contrast  Result Date: 06/01/2016 CLINICAL DATA:  Initial evaluation for acute left facial numbness. EXAM: MRI HEAD WITHOUT AND WITH CONTRAST MRA HEAD WITHOUT CONTRAST MRV HEAD WITHOUT CONTRAST  TECHNIQUE: Multiplanar, multiecho pulse sequences of the brain and surrounding structures were obtained without and with intravenous contrast. Angiographic images of the intracranial venous structures were obtained using MRV technique without intravenous contrast. CONTRAST:  20mL MULTIHANCE GADOBENATE DIMEGLUMINE 529 MG/ML IV SOLN COMPARISON:  Prior CT from 05/31/2016. FINDINGS: MRI HEAD: Study degraded by motion artifact and body habitus. Cerebral volume normal. No focal parenchymal signal abnormality. No significant cerebral white matter disease. No abnormal foci of restricted diffusion to suggest acute or subacute ischemia. Gray-white matter differentiation maintained. No areas of chronic infarction. No evidence for acute or chronic intracranial hemorrhage. No mass lesion, midline shift or mass effect. No hydrocephalus. No extra-axial fluid collection. No abnormal enhancement. Pituitary gland and suprasellar region within normal limits. Midline structures intact and normal. Major intracranial vascular flow voids maintained. Craniocervical junction normal. Bone marrow signal intensity normal. No scalp soft tissue abnormality. Globes and orbital soft tissues within normal limits. Retention cyst noted within the left maxillary sinus. Scattered mucosal thickening within the ethmoidal air cells. Paranasal sinuses are otherwise clear. No mastoid effusion. Inner ear structures grossly normal. MRA HEAD: ANTERIOR CIRCULATION: Study degraded by motion artifact and body habitus. Distal cervical segments of the internal carotid arteries are patent with antegrade flow. Petrous, cavernous, supraclinoid segments  patent without flow-limiting stenosis. A1 segments patent bilaterally. Anterior communicating artery normal. Anterior cerebral arteries patent to their distal aspects. M1 segments patent without stenosis or occlusion. No proximal M2 occlusion. Distal MCA branches well opacified and symmetric bilaterally. POSTERIOR  CIRCULATION: Vertebral arteries patent to the vertebrobasilar junction. Left vertebral artery dominant. Left PICA patent proximally. Right PICA not visualized. Basilar artery widely patent to its distal aspect. Superior cerebellar and posterior cerebral arteries patent bilaterally. Small left posterior communicating artery noted. No aneurysm or vascular malformation. MRV HEAD: Study degraded by motion artifact and body habitus. Superior sagittal sinus widely patent. Right transverse and sigmoid sinuses are patent as is the proximal right internal jugular vein. Straight sinus, vein of Galen, and internal cerebral veins are patent. There is persistent tubular nonocclusive filling defect within the lateral left transverse sinus, which may reflect persistent and/or current dural sinus thrombosis (series 9, image 31). This is relatively similar as compared to previous MRV. IMPRESSION: 1. Moderately degraded exam due to motion artifact and body habitus. 2. Normal contrast-enhanced MRI of the brain. 3. Normal MRA of the brain. 4. Small tubular nonocclusive filling defect within the left transverse sinus, concerning for possible recurrent and/or residual dural sinus thrombosis. Electronically Signed   By: Rise Mu M.D.   On: 06/01/2016 06:54   Mr Laqueta Jean ZO Contrast  Result Date: 06/01/2016 CLINICAL DATA:  Initial evaluation for acute left facial numbness. EXAM: MRI HEAD WITHOUT AND WITH CONTRAST MRA HEAD WITHOUT CONTRAST MRV HEAD WITHOUT CONTRAST TECHNIQUE: Multiplanar, multiecho pulse sequences of the brain and surrounding structures were obtained without and with intravenous contrast. Angiographic images of the intracranial venous structures were obtained using MRV technique without intravenous contrast. CONTRAST:  20mL MULTIHANCE GADOBENATE DIMEGLUMINE 529 MG/ML IV SOLN COMPARISON:  Prior CT from 05/31/2016. FINDINGS: MRI HEAD: Study degraded by motion artifact and body habitus. Cerebral volume normal.  No focal parenchymal signal abnormality. No significant cerebral white matter disease. No abnormal foci of restricted diffusion to suggest acute or subacute ischemia. Gray-white matter differentiation maintained. No areas of chronic infarction. No evidence for acute or chronic intracranial hemorrhage. No mass lesion, midline shift or mass effect. No hydrocephalus. No extra-axial fluid collection. No abnormal enhancement. Pituitary gland and suprasellar region within normal limits. Midline structures intact and normal. Major intracranial vascular flow voids maintained. Craniocervical junction normal. Bone marrow signal intensity normal. No scalp soft tissue abnormality. Globes and orbital soft tissues within normal limits. Retention cyst noted within the left maxillary sinus. Scattered mucosal thickening within the ethmoidal air cells. Paranasal sinuses are otherwise clear. No mastoid effusion. Inner ear structures grossly normal. MRA HEAD: ANTERIOR CIRCULATION: Study degraded by motion artifact and body habitus. Distal cervical segments of the internal carotid arteries are patent with antegrade flow. Petrous, cavernous, supraclinoid segments patent without flow-limiting stenosis. A1 segments patent bilaterally. Anterior communicating artery normal. Anterior cerebral arteries patent to their distal aspects. M1 segments patent without stenosis or occlusion. No proximal M2 occlusion. Distal MCA branches well opacified and symmetric bilaterally. POSTERIOR CIRCULATION: Vertebral arteries patent to the vertebrobasilar junction. Left vertebral artery dominant. Left PICA patent proximally. Right PICA not visualized. Basilar artery widely patent to its distal aspect. Superior cerebellar and posterior cerebral arteries patent bilaterally. Small left posterior communicating artery noted. No aneurysm or vascular malformation. MRV HEAD: Study degraded by motion artifact and body habitus. Superior sagittal sinus widely patent.  Right transverse and sigmoid sinuses are patent as is the proximal right internal jugular vein. Straight sinus, vein of  Galen, and internal cerebral veins are patent. There is persistent tubular nonocclusive filling defect within the lateral left transverse sinus, which may reflect persistent and/or current dural sinus thrombosis (series 9, image 31). This is relatively similar as compared to previous MRV. IMPRESSION: 1. Moderately degraded exam due to motion artifact and body habitus. 2. Normal contrast-enhanced MRI of the brain. 3. Normal MRA of the brain. 4. Small tubular nonocclusive filling defect within the left transverse sinus, concerning for possible recurrent and/or residual dural sinus thrombosis. Electronically Signed   By: Rise Mu M.D.   On: 06/01/2016 06:54   Mr Mrv Head Wo Cm  Result Date: 06/01/2016 CLINICAL DATA:  Initial evaluation for acute left facial numbness. EXAM: MRI HEAD WITHOUT AND WITH CONTRAST MRA HEAD WITHOUT CONTRAST MRV HEAD WITHOUT CONTRAST TECHNIQUE: Multiplanar, multiecho pulse sequences of the brain and surrounding structures were obtained without and with intravenous contrast. Angiographic images of the intracranial venous structures were obtained using MRV technique without intravenous contrast. CONTRAST:  20mL MULTIHANCE GADOBENATE DIMEGLUMINE 529 MG/ML IV SOLN COMPARISON:  Prior CT from 05/31/2016. FINDINGS: MRI HEAD: Study degraded by motion artifact and body habitus. Cerebral volume normal. No focal parenchymal signal abnormality. No significant cerebral white matter disease. No abnormal foci of restricted diffusion to suggest acute or subacute ischemia. Gray-white matter differentiation maintained. No areas of chronic infarction. No evidence for acute or chronic intracranial hemorrhage. No mass lesion, midline shift or mass effect. No hydrocephalus. No extra-axial fluid collection. No abnormal enhancement. Pituitary gland and suprasellar region within normal  limits. Midline structures intact and normal. Major intracranial vascular flow voids maintained. Craniocervical junction normal. Bone marrow signal intensity normal. No scalp soft tissue abnormality. Globes and orbital soft tissues within normal limits. Retention cyst noted within the left maxillary sinus. Scattered mucosal thickening within the ethmoidal air cells. Paranasal sinuses are otherwise clear. No mastoid effusion. Inner ear structures grossly normal. MRA HEAD: ANTERIOR CIRCULATION: Study degraded by motion artifact and body habitus. Distal cervical segments of the internal carotid arteries are patent with antegrade flow. Petrous, cavernous, supraclinoid segments patent without flow-limiting stenosis. A1 segments patent bilaterally. Anterior communicating artery normal. Anterior cerebral arteries patent to their distal aspects. M1 segments patent without stenosis or occlusion. No proximal M2 occlusion. Distal MCA branches well opacified and symmetric bilaterally. POSTERIOR CIRCULATION: Vertebral arteries patent to the vertebrobasilar junction. Left vertebral artery dominant. Left PICA patent proximally. Right PICA not visualized. Basilar artery widely patent to its distal aspect. Superior cerebellar and posterior cerebral arteries patent bilaterally. Small left posterior communicating artery noted. No aneurysm or vascular malformation. MRV HEAD: Study degraded by motion artifact and body habitus. Superior sagittal sinus widely patent. Right transverse and sigmoid sinuses are patent as is the proximal right internal jugular vein. Straight sinus, vein of Galen, and internal cerebral veins are patent. There is persistent tubular nonocclusive filling defect within the lateral left transverse sinus, which may reflect persistent and/or current dural sinus thrombosis (series 9, image 31). This is relatively similar as compared to previous MRV. IMPRESSION: 1. Moderately degraded exam due to motion artifact and  body habitus. 2. Normal contrast-enhanced MRI of the brain. 3. Normal MRA of the brain. 4. Small tubular nonocclusive filling defect within the left transverse sinus, concerning for possible recurrent and/or residual dural sinus thrombosis. Electronically Signed   By: Rise Mu M.D.   On: 06/01/2016 06:54   Ct Venogram Head  Result Date: 06/01/2016 CLINICAL DATA:  Chronic headaches. History of stroke and diabetes. Assess residual  or recurrent LEFT transverse sinus dural vein thrombosis. EXAM: CT VENOGRAM HEAD TECHNIQUE: Axial noncontrast CT images of obtained from the vertex to the foramen magnum. Coronal and sagittal reformations reviewed. Postcontrast venogram axial CT images of the head with MPR. MIP images unable to be generated on this particular scanner per technologist. CONTRAST:  ISOVUE-300 IOPAMIDOL (ISOVUE-300) INJECTION 61% COMPARISON:  MRI of the head Jun 01, 2016 at 0526 hours and CT HEAD May 31, 2016 FINDINGS: CT HEAD: BRAIN: No intraparenchymal hemorrhage, mass effect nor midline shift. The ventricles and sulci are normal. No acute large vascular territory infarcts. No abnormal extra-axial fluid collections. Basal cisterns are patent. No abnormal intracranial enhancement. VASCULAR: Hypodense tubular structure within LEFT transverse sinus again noted. SKULL/SOFT TISSUES: No skull fracture. No significant soft tissue swelling. ORBITS/SINUSES: The included ocular globes and orbital contents are normal.LEFT maxillary sinus mucosal thickening. Trace ethmoid mucosal thickening. Mastoid air cells are well aerated. OTHER: None. CT VENOGRAM: Tubular nonocclusive 19 x 5 mm filling defect LEFT transverse sinus, same location as previously though different in configuration (not arachnoid granulation). Normal contrast enhancement within the superior sagittal sinus, torcula of the Herophili, RIGHT transverse, sigmoid sinuses and included internal jugular veins. Normal contrast enhancement of  the internal cerebral veins. IMPRESSION: Normal CT HEAD with and without contrast. 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 Electronically Signed   By: Awilda Metro M.D.   On: 06/01/2016 20:37    Subjective: Pt without complaints.    Discharge Exam: Vitals:   06/02/16 0458 06/02/16 0915  BP: 120/62 124/78  Pulse: 65 89  Resp: 18 20  Temp: 97.8 F (36.6 C) 98.2 F (36.8 C)   Vitals:   06/01/16 2102 06/02/16 0036 06/02/16 0458 06/02/16 0915  BP: 109/60 113/68 120/62 124/78  Pulse: 84 81 65 89  Resp: 18 18 18 20   Temp: 99.1 F (37.3 C) 98.6 F (37 C) 97.8 F (36.6 C) 98.2 F (36.8 C)  TempSrc: Oral Oral Oral Oral  SpO2: 95% 98% 98% 96%  Weight:      Height:       General: Pt is alert, awake, not in acute distress Cardiovascular: RRR, S1/S2 +, no rubs, no gallops Respiratory: CTA bilaterally, no wheezing, no rhonchi Abdominal: Soft, NT, ND, bowel sounds + Extremities: no edema, no cyanosis  The results of significant diagnostics from this hospitalization (including imaging, microbiology, ancillary and laboratory) are listed below for reference.     Microbiology: No results found for this or any previous visit (from the past 240 hour(s)).   Labs: BNP (last 3 results) No results for input(s): BNP in the last 8760 hours. Basic Metabolic Panel:  Recent Labs Lab 05/31/16 2132 05/31/16 2145 06/02/16 0650  NA 136 139 140  K 3.6 3.8 4.1  CL 101 101 106  CO2 26  --  26  GLUCOSE 127* 127* 135*  BUN 13 15 8   CREATININE 0.92 0.80 0.72  CALCIUM 9.0  --  8.7*   Liver Function Tests:  Recent Labs Lab 05/31/16 2132 06/02/16 0650  AST 30 20  ALT 38 34  ALKPHOS 73 76  BILITOT 0.6 0.4  PROT 7.2 6.6  ALBUMIN 4.1 3.6   No results for input(s): LIPASE, AMYLASE in the last 168 hours. No results for input(s): AMMONIA in the last 168 hours. CBC:  Recent Labs Lab 05/31/16 2132 05/31/16 2145  06/02/16 0650  WBC 9.2  --  6.9  NEUTROABS 5.3  --   --  HGB 14.8 14.6 14.1  HCT 43.8 43.0 43.3  MCV 86.9  --  88.4  PLT 168  --  136*   Cardiac Enzymes: No results for input(s): CKTOTAL, CKMB, CKMBINDEX, TROPONINI in the last 168 hours. BNP: Invalid input(s): POCBNP CBG:  Recent Labs Lab 06/01/16 1146 06/01/16 1620 06/01/16 2101 06/02/16 0611 06/02/16 1143  GLUCAP 119* 103* 136* 139* 137*   D-Dimer No results for input(s): DDIMER in the last 72 hours. Hgb A1c No results for input(s): HGBA1C in the last 72 hours. Lipid Profile  Recent Labs  06/02/16 0650  CHOL 197  HDL 28*  LDLCALC 138*  TRIG 157*  CHOLHDL 7.0   Thyroid function studies No results for input(s): TSH, T4TOTAL, T3FREE, THYROIDAB in the last 72 hours.  Invalid input(s): FREET3 Anemia work up No results for input(s): VITAMINB12, FOLATE, FERRITIN, TIBC, IRON, RETICCTPCT in the last 72 hours. Urinalysis    Component Value Date/Time   COLORURINE YELLOW 05/31/2014 2331   APPEARANCEUR CLEAR 05/31/2014 2331   LABSPEC >1.046 (H) 05/31/2014 2331   PHURINE 5.5 05/31/2014 2331   GLUCOSEU 100 (A) 05/31/2014 2331   HGBUR TRACE (A) 05/31/2014 2331   BILIRUBINUR NEGATIVE 05/31/2014 2331   KETONESUR NEGATIVE 05/31/2014 2331   PROTEINUR 100 (A) 05/31/2014 2331   UROBILINOGEN 0.2 05/31/2014 2331   NITRITE NEGATIVE 05/31/2014 2331   LEUKOCYTESUR NEGATIVE 05/31/2014 2331   Sepsis Labs Invalid input(s): PROCALCITONIN,  WBC,  LACTICIDVEN Microbiology No results found for this or any previous visit (from the past 240 hour(s)).  Time coordinating discharge: 34 minutes  SIGNED:  Standley Dakins, MD  [facility] 06/02/2016, 2:06 PM Pager [phone]  If 7PM-7AM, please contact night-coverage [email or web address] Password [email or web address]

## 2016-06-03 LAB — HEMOGLOBIN A1C
Hgb A1c MFr Bld: 6.9 % — ABNORMAL HIGH (ref 4.8–5.6)
Mean Plasma Glucose: 151 mg/dL

## 2016-06-17 DIAGNOSIS — Z7901 Long term (current) use of anticoagulants: Secondary | ICD-10-CM | POA: Insufficient documentation

## 2016-08-30 NOTE — Progress Notes (Signed)
[facility]  PATIENT: [name] Holland DOB: [birth year]   REASON FOR VISIT: Follow-up for symptoms of left sided headaches, visual and sensory changes.  HISTORY FROM:patient     HISTORY OF PRESENT ILLNESS:(COPIED from hospital note)[name] Holland an 46 y.o.malewho presents with recurrence of left sided headache rated as 7-9/10. 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 Also reports mild sensory loss and tingling sensation on the left. Also endorses left monocular visual blurring. His symptoms began on Monday at 11 AM when he felt a pop in the back of his head. No associated confusion, difficulty speaking, limb weakness or limb numbness. He was on anticoagulation for a period of time after the sinus thrombosis diagnosis 2 years ago, then was taken off this medication.  He admitted that he came back from [facility] Sunday and drove for 14 hours. He was found to have LE DVT. He was here 2 years ago for likely left transverse sinus venous thrombosis and was on Xarelto for 6 months. 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 CTV pending.  Interval history 08/31/16 [name] Holland, [name] Holland returns for hospital follow-up follow-up. He was admitted on 05/31/16 with left-sided headache and visual changes and  sensory changes. CT of the head no acute abnormality. MRI MRA normal. MRV small tubular nonocclusive filling defect within the left transverse sinus versus residue Holland thrombosis. Left Lower extremity positive for acute DVT involving the left mid and distal femoral vein. Carotid Doppler unremarkable. CTV head  no acute finding. 2-D echo EF 55-60%. LDL 138. Hemoglobin A1c 6.9. Hypercoagulable workup was negative Patient claims he has lost about 50 pounds since hospitalization. He has stopped Xarelto Zocor, Zetia  and  Glucophage on his own. He is taking a baby aspirin and claims he is exercising five  times a week. He stopped taking his medications first of June. He claims he does not like taking medications. He returns for reevaluation without further headache visual changes or sensory changes since  REVIEW OF SYSTEMS: Full 14 system review of systems performed and notable only for those listed, all others are neg:  Constitutional: neg  Cardiovascular: neg Ear/Nose/Throat: neg  Skin: neg Eyes: neg Respiratory: neg Gastroitestinal: neg  Hematology/Lymphatic: neg  Endocrine: neg Musculoskeletal:neg Allergy/Immunology: neg Neurological: neg Psychiatric: neg Sleep : neg   ALLERGIES: No Known Allergies  HOME MEDICATIONS: Outpatient Medications Prior to Visit  Medication Sig Dispense Refill  . Diclofenac Sodium (PENNSAID) 2 % SOLN Place 2 application onto the skin 2 (two) times daily. Pt applies to knees.    Marland Kitchen ezetimibe (ZETIA) 10 MG tablet Take 1 tablet (10 mg total) by mouth daily. (Patient not taking: Reported on 08/31/2016) 30 tablet 0  . metFORMIN (GLUCOPHAGE) 500 MG tablet Take 1 tablet (500 mg total) by mouth daily with breakfast. (Patient not taking: Reported on 08/31/2016) 30 tablet 1  . Rivaroxaban (XARELTO STARTER PACK) 15 & 20 MG TBPK Take as directed on package: Start with one 15mg  tablet by mouth twice a day with food thru 6/12. On 6/13 switch to one 20mg  tablet once a day with food. (Patient not taking: Reported on 08/31/2016) 51 each 0  . simvastatin (ZOCOR) 20 MG tablet Take 20 mg by mouth at bedtime.     No facility-administered medications prior to visit.     PAST MEDICAL HISTORY: Past Medical History:  Diagnosis Date  . Diabetes mellitus without complication (HCC)   . DVT (deep venous thrombosis) (HCC)   . Stroke [facility])     PAST SURGICAL HISTORY: Past Surgical History:  Procedure Laterality Date  . KNEE ARTHROSCOPY      FAMILY HISTORY: Family History  Problem Relation  Age of Onset  . Other Mother   . Diabetes type II Mother   . CAD Father     SOCIAL HISTORY: Social History   Social History  . Marital status: Legally Separated    Spouse name: N/A  . Number of children: N/A  . Years of education: N/A   Occupational History  . Not on file.   Social History Main Topics  . Smoking status: Former Smoker    Packs/day: 0.00  . Smokeless tobacco: Never Used  . Alcohol use No  . Drug use: No  . Sexual activity: Not Currently   Other Topics Concern  . Not on file   Social History Narrative  . No narrative on file     PHYSICAL EXAM  Vitals:   08/31/16 0802  BP: 120/76  Pulse: 69  Weight: 297 lb 3.2 oz (134.8 kg)  Height: 6\' 1"  (1.854 m)   Body mass index is 39.21 kg/m.  Generalized: Well developed, Obese male in no acute distress  Head: normocephalic and atraumatic,. Oropharynx benign  Neck: Supple, no carotid bruits  Cardiac: Regular rate rhythm, no murmur  Musculoskeletal: No deformity   Neurological examination   Mentation: Alert oriented to time, place, history taking. Attention span and concentration appropriate. Recent and remote memory intact.  Follows all commands speech and language fluent.   Cranial nerve II-XII: Fundoscopic exam reveals sharp disc margins.Pupils were equal round reactive to light extraocular movements were full, visual field were full on confrontational test. Facial sensation and strength were normal. hearing was intact to finger rubbing bilaterally. Uvula tongue midline. head turning and shoulder shrug were normal and symmetric.Tongue protrusion into cheek strength was normal. Motor: normal bulk and tone, full strength in the BUE, BLE, fine finger movements normal, no pronator drift. No focal weakness Sensory: normal and symmetric to light touch, pinprick, and  Vibration, in the upper and lower extremities Coordination: finger-nose-finger, heel-to-shin bilaterally, no dysmetria Reflexes: 1+ upper lower  and symmetric, plantar responses were flexor bilaterally. Gait and Station: Rising up from seated position without assistance, normal stance,  moderate stride, good arm swing, smooth turning, able to perform tiptoe, and heel walking without difficulty. Tandem gait is steady  DIAGNOSTIC DATA (LABS, IMAGING, TESTING) - I reviewed patient records, labs, notes, testing and imaging myself where available.  Lab Results  Component Value Date   WBC 6.9 06/02/2016   HGB 14.1 06/02/2016   HCT 43.3 06/02/2016   MCV 88.4 06/02/2016   PLT 136 (L) 06/02/2016      Component Value Date/Time   NA 140 06/02/2016 0650   K 4.1 06/02/2016 0650   CL 106 06/02/2016 0650   CO2 26 06/02/2016 0650   GLUCOSE 135 (H) 06/02/2016 0650   BUN 8 06/02/2016 0650   CREATININE 0.72 06/02/2016 0650   CALCIUM 8.7 (L) 06/02/2016 0650   PROT 6.6 06/02/2016 0650   ALBUMIN 3.6 06/02/2016 0650   AST 20 06/02/2016 0650   ALT 34 06/02/2016 0650   ALKPHOS 76 06/02/2016 0650   BILITOT 0.4 06/02/2016 0650   GFRNONAA >60 06/02/2016 0650   GFRAA >60 06/02/2016 0650   Lab Results  Component Value Date   CHOL  197 06/02/2016   HDL 28 (L) 06/02/2016   LDLCALC 138 (H) 06/02/2016   TRIG 157 (H) 06/02/2016   CHOLHDL 7.0 06/02/2016   Lab Results  Component Value Date   HGBA1C 6.9 (H) 06/02/2016    ASSESSMENT AND PLAN  46 y.o. year Holland male  has a past medical history of Diabetes mellitus without complication (HCC); DVT (deep venous thrombosis) (HCC); and Stroke (HCC). here To follow-up for hospitalization for left-sided headache visual changes in sensory changes. Workup for stroke was negative positive for DVT left lower extremity . Patient has a previous history of stroke  PLAN: Stressed the importance of management of risk factors for stroke  Continue aspirin at least for secondary stroke prevention and DVT patient has stopped Xarelto  Maintain strict control of hypertension with blood pressure goal below 130/90,  today's reading 120/76 Control of diabetes with hemoglobin A1c below 6.5 followed by primary care most recent hemoglobin A1c6.9, patient stop Glucophage Cholesterol with LDL cholesterol less than 70, followed by primary care,  most recent 138 patient has stopped Zocor and Zetia Exercise by walking, has personal trainer eat healthy diet with whole grains,  fresh fruits and vegetables, continue weight loss Will discharge from stroke clinic, will follow up with Dr. Mardelle Matte Discussed risk for recurrent stroke since she has had a previous stroke importance of being compliant with medications This was a  requiring 30 minutes and medical decision making of high complexity with extensive review of history, hospital chart, counseling and answering questions Nilda Riggs, [facility], [facility], APRN  [facility] [address], [address] [place], [place] [address] [phone]

## 2016-08-31 ENCOUNTER — Ambulatory Visit (INDEPENDENT_AMBULATORY_CARE_PROVIDER_SITE_OTHER): Payer: BLUE CROSS/BLUE SHIELD | Admitting: Nurse Practitioner

## 2016-08-31 ENCOUNTER — Encounter (INDEPENDENT_AMBULATORY_CARE_PROVIDER_SITE_OTHER): Payer: Self-pay

## 2016-08-31 ENCOUNTER — Encounter: Payer: Self-pay | Admitting: Nurse Practitioner

## 2016-08-31 VITALS — BP 120/76 | HR 69 | Ht 73.0 in | Wt 297.2 lb

## 2016-08-31 DIAGNOSIS — Z8673 Personal history of transient ischemic attack (TIA), and cerebral infarction without residual deficits: Secondary | ICD-10-CM

## 2016-08-31 DIAGNOSIS — R200 Anesthesia of skin: Secondary | ICD-10-CM | POA: Diagnosis not present

## 2016-08-31 DIAGNOSIS — I82412 Acute embolism and thrombosis of left femoral vein: Secondary | ICD-10-CM

## 2016-08-31 DIAGNOSIS — E785 Hyperlipidemia, unspecified: Secondary | ICD-10-CM

## 2016-08-31 DIAGNOSIS — Z9114 Patient's other noncompliance with medication regimen: Secondary | ICD-10-CM

## 2016-08-31 DIAGNOSIS — Z86718 Personal history of other venous thrombosis and embolism: Secondary | ICD-10-CM | POA: Insufficient documentation

## 2016-08-31 DIAGNOSIS — Z91148 Patient's other noncompliance with medication regimen for other reason: Secondary | ICD-10-CM

## 2016-08-31 NOTE — Patient Instructions (Signed)
Stressed the importance of management of risk factors for stroke  Continue aspirin at least for secondary stroke prevention and DVT patient has stopped Xarelto  Maintain strict control of hypertension with blood pressure goal below 130/90, today's reading 120/76 Control of diabetes with hemoglobin A1c below 6.5 followed by primary care most recent hemoglobin A1c6.9 Cholesterol with LDL cholesterol less than 70, followed by primary care,  most recent 138 patient has stopped Zocor Exercise by walking, has personal trainer eat healthy diet with whole grains,  fresh fruits and vegetables, continue weight loss Will discharge from stroke clinic

## 2016-09-01 NOTE — Progress Notes (Signed)
I reviewed above note and agree with the assessment and plan.  Marvel Plan, MD PhD Stroke Neurology 09/01/2016 12:24 AM

## 2016-11-23 ENCOUNTER — Inpatient Hospital Stay (HOSPITAL_COMMUNITY)
Admission: EM | Admit: 2016-11-23 | Discharge: 2016-11-25 | DRG: 247 | Disposition: A | Discharge status: Home / Self Care | Payer: BLUE CROSS/BLUE SHIELD | Attending: Internal Medicine | Admitting: Internal Medicine

## 2016-11-23 ENCOUNTER — Other Ambulatory Visit: Payer: Self-pay

## 2016-11-23 ENCOUNTER — Encounter (HOSPITAL_COMMUNITY): Payer: Self-pay | Admitting: Cardiology

## 2016-11-23 ENCOUNTER — Emergency Department (HOSPITAL_COMMUNITY): Payer: BLUE CROSS/BLUE SHIELD

## 2016-11-23 DIAGNOSIS — Z8249 Family history of ischemic heart disease and other diseases of the circulatory system: Secondary | ICD-10-CM

## 2016-11-23 DIAGNOSIS — Z8673 Personal history of transient ischemic attack (TIA), and cerebral infarction without residual deficits: Secondary | ICD-10-CM | POA: Diagnosis not present

## 2016-11-23 DIAGNOSIS — R7303 Prediabetes: Secondary | ICD-10-CM

## 2016-11-23 DIAGNOSIS — Z955 Presence of coronary angioplasty implant and graft: Secondary | ICD-10-CM

## 2016-11-23 DIAGNOSIS — Z7984 Long term (current) use of oral hypoglycemic drugs: Secondary | ICD-10-CM

## 2016-11-23 DIAGNOSIS — I25110 Atherosclerotic heart disease of native coronary artery with unstable angina pectoris: Secondary | ICD-10-CM | POA: Diagnosis present

## 2016-11-23 DIAGNOSIS — E118 Type 2 diabetes mellitus with unspecified complications: Secondary | ICD-10-CM | POA: Diagnosis present

## 2016-11-23 DIAGNOSIS — I214 Non-ST elevation (NSTEMI) myocardial infarction: Secondary | ICD-10-CM | POA: Diagnosis not present

## 2016-11-23 DIAGNOSIS — Z86718 Personal history of other venous thrombosis and embolism: Secondary | ICD-10-CM | POA: Diagnosis not present

## 2016-11-23 DIAGNOSIS — E785 Hyperlipidemia, unspecified: Secondary | ICD-10-CM | POA: Diagnosis present

## 2016-11-23 DIAGNOSIS — I200 Unstable angina: Secondary | ICD-10-CM | POA: Diagnosis not present

## 2016-11-23 DIAGNOSIS — R079 Chest pain, unspecified: Secondary | ICD-10-CM

## 2016-11-23 DIAGNOSIS — Z7982 Long term (current) use of aspirin: Secondary | ICD-10-CM

## 2016-11-23 DIAGNOSIS — Z79899 Other long term (current) drug therapy: Secondary | ICD-10-CM

## 2016-11-23 DIAGNOSIS — Z6838 Body mass index (BMI) 38.0-38.9, adult: Secondary | ICD-10-CM

## 2016-11-23 HISTORY — DX: Type 2 diabetes mellitus without complications: E11.9

## 2016-11-23 HISTORY — DX: Acute myocardial infarction, unspecified: I21.9

## 2016-11-23 HISTORY — DX: Unspecified osteoarthritis, unspecified site: M19.90

## 2016-11-23 LAB — CBC
HCT: 43.5 % (ref 39.0–52.0)
Hemoglobin: 14.1 g/dL (ref 13.0–17.0)
MCH: 28.3 pg (ref 26.0–34.0)
MCHC: 32.4 g/dL (ref 30.0–36.0)
MCV: 87.2 fL (ref 78.0–100.0)
PLATELETS: 214 10*3/uL (ref 150–400)
RBC: 4.99 MIL/uL (ref 4.22–5.81)
RDW: 16 % — AB (ref 11.5–15.5)
WBC: 10.5 10*3/uL (ref 4.0–10.5)

## 2016-11-23 LAB — RAPID URINE DRUG SCREEN, HOSP PERFORMED
AMPHETAMINES: NOT DETECTED
BARBITURATES: NOT DETECTED
BENZODIAZEPINES: NOT DETECTED
Cocaine: NOT DETECTED
Opiates: NOT DETECTED
TETRAHYDROCANNABINOL: NOT DETECTED

## 2016-11-23 LAB — BASIC METABOLIC PANEL
Anion gap: 8 (ref 5–15)
BUN: 13 mg/dL (ref 6–20)
CHLORIDE: 105 mmol/L (ref 101–111)
CO2: 22 mmol/L (ref 22–32)
CREATININE: 0.99 mg/dL (ref 0.61–1.24)
Calcium: 8.7 mg/dL — ABNORMAL LOW (ref 8.9–10.3)
GFR calc Af Amer: >60 mL/min (ref >60)
GFR calc non Af Amer: >60 mL/min (ref >60)
GLUCOSE: 108 mg/dL — AB (ref 65–99)
Potassium: 3.6 mmol/L (ref 3.5–5.1)
Sodium: 135 mmol/L (ref 135–145)

## 2016-11-23 LAB — D-DIMER, QUANTITATIVE: D-Dimer, Quant: <0.27 ug/mL-FEU (ref 0.00–0.50)

## 2016-11-23 LAB — PROTIME-INR
INR: 1.05
Prothrombin Time: 13.6 seconds (ref 11.4–15.2)

## 2016-11-23 LAB — TROPONIN I
TROPONIN I: 0.2 ng/mL — AB (ref <0.03)
Troponin I: 0.06 ng/mL (ref <0.03)
Troponin I: 0.45 ng/mL (ref <0.03)

## 2016-11-23 LAB — GLUCOSE, CAPILLARY: GLUCOSE-CAPILLARY: 121 mg/dL — AB (ref 65–99)

## 2016-11-23 MED ORDER — SODIUM CHLORIDE 0.9% FLUSH
3.0000 mL | Freq: Two times a day (BID) | INTRAVENOUS | Status: DC
  Administered 2016-11-24: 3 mL via INTRAVENOUS

## 2016-11-23 MED ORDER — SODIUM CHLORIDE 0.9 % WEIGHT BASED INFUSION
3.0000 mL/kg/h | INTRAVENOUS | Status: DC
  Administered 2016-11-24: 3 mL/kg/h via INTRAVENOUS

## 2016-11-23 MED ORDER — ONDANSETRON HCL 4 MG/2ML IJ SOLN: 4.0000 mg | Freq: Four times a day (QID) | INTRAMUSCULAR | Status: DC | PRN

## 2016-11-23 MED ORDER — ACETAMINOPHEN 325 MG PO TABS: 650.0000 mg | ORAL_TABLET | Freq: Four times a day (QID) | ORAL | Status: DC | PRN

## 2016-11-23 MED ORDER — SODIUM CHLORIDE 0.9% FLUSH: 3.0000 mL | INTRAVENOUS | Status: DC | PRN

## 2016-11-23 MED ORDER — ACETAMINOPHEN 650 MG RE SUPP: 650.0000 mg | Freq: Four times a day (QID) | RECTAL | Status: DC | PRN

## 2016-11-23 MED ORDER — ASPIRIN 81 MG PO CHEW
81.0000 mg | CHEWABLE_TABLET | ORAL | Status: AC
  Administered 2016-11-24: 81 mg via ORAL
  Filled 2016-11-23: qty 1

## 2016-11-23 MED ORDER — ASPIRIN EC 325 MG PO TBEC: 325.0000 mg | DELAYED_RELEASE_TABLET | Freq: Every day | ORAL | Status: DC

## 2016-11-23 MED ORDER — ACETAMINOPHEN 325 MG PO TABS: 650.0000 mg | ORAL_TABLET | ORAL | Status: DC | PRN

## 2016-11-23 MED ORDER — HEPARIN SODIUM (PORCINE) 5000 UNIT/ML IJ SOLN: 5000.0000 [IU] | Freq: Three times a day (TID) | INTRAMUSCULAR | Status: DC

## 2016-11-23 MED ORDER — INFLUENZA VAC SPLIT QUAD 0.5 ML IM SUSY: 0.5000 mL | PREFILLED_SYRINGE | INTRAMUSCULAR | Status: DC

## 2016-11-23 MED ORDER — GI COCKTAIL ~~LOC~~: 30.0000 mL | Freq: Four times a day (QID) | ORAL | Status: DC | PRN

## 2016-11-23 MED ORDER — SODIUM CHLORIDE 0.9 % WEIGHT BASED INFUSION
1.0000 mL/kg/h | INTRAVENOUS | Status: DC
  Administered 2016-11-24: 1 mL/kg/h via INTRAVENOUS

## 2016-11-23 MED ORDER — SODIUM CHLORIDE 0.9% FLUSH
3.0000 mL | Freq: Two times a day (BID) | INTRAVENOUS | Status: DC
  Administered 2016-11-23: 3 mL via INTRAVENOUS

## 2016-11-23 MED ORDER — SODIUM CHLORIDE 0.9 % IV SOLN: 250.0000 mL | INTRAVENOUS | Status: DC | PRN

## 2016-11-23 MED ORDER — ONDANSETRON HCL 4 MG PO TABS: 4.0000 mg | ORAL_TABLET | Freq: Four times a day (QID) | ORAL | Status: DC | PRN

## 2016-11-23 NOTE — ED Provider Notes (Signed)
[facility] EMERGENCY DEPARTMENT Provider Note   CSN: [id number] Arrival date & time: 11/23/16  1153     History   Chief Complaint Chief Complaint  Patient presents with  . Chest Pain    HPI [name] Holland is a 46 y.o. male.  Pt presents to the ED with cp.  Pt said it started after exercising this morning.  He developed nausea and some pain in his left arm.  He called EMS and was given 3 nitro as well as ASA.  The nitro did help his pain.  The pt does have a hx of Cavernous sinus thrombosis and recurrent DVT.  He was on Xarelto until he took himself off this med.  He had a hypercoagulable work up in May which was negative.       Past Medical History:  Diagnosis Date  . Diabetes mellitus without complication (HCC)   . DVT (deep venous thrombosis) (HCC)   . Stroke [facility])     Patient Active Problem List   Diagnosis Date Noted  . History of CVA (cerebrovascular accident) 08/31/2016  . Noncompliance with medications 08/31/2016  . Acute deep vein thrombosis (DVT) of femoral vein (HCC)   . Left facial numbness 05/31/2016  . HLD (hyperlipidemia) 05/31/2016  . Thrombus   . Morbid obesity (HCC)   . Coital headache   . CVA (cerebral infarction) 05/31/2014  . DM type 2 causing neurological disease, not at goal [facility]) 05/31/2014  . 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 KNEE ARTHROSCOPY         Home Medications    Prior to Admission medications   Medication Sig Start Date End Date Taking? Authorizing Provider  ibuprofen (ADVIL,MOTRIN) 200 MG tablet Take 400 mg every 6 (six) hours as needed by mouth for moderate pain.   Yes [provider]  ASPIRIN 81 PO Take by mouth.    [provider]  Diclofenac Sodium (PENNSAID) 2 % SOLN Place 2 application onto the skin 2 (two) times daily. Pt applies to knees.    [provider]  ezetimibe (ZETIA) 10 MG tablet Take 1  tablet (10 mg total) by mouth daily. Patient not taking: Reported on 08/31/2016 06/02/14   Joseph Art, DO  metFORMIN (GLUCOPHAGE) 500 MG tablet Take 1 tablet (500 mg total) by mouth daily with breakfast. Patient not taking: Reported on 08/31/2016 06/02/14   Joseph Art, DO  Multiple Vitamins-Minerals (MULTIVITAMIN ADULT PO) Take by mouth.    [provider]  Rivaroxaban (XARELTO STARTER PACK) 15 & 20 MG TBPK Take as directed on package: Start with one 15mg  tablet by mouth twice a day with food thru 6/12. On 6/13 switch to one 20mg  tablet once a day with food. Patient not taking: Reported on 08/31/2016 06/02/16   Cleora Fleet, MD  simvastatin (ZOCOR) 20 MG tablet Take 20 mg by mouth at bedtime.    [provider]    Family History Family History  Problem Relation Age of Onset  . Other Mother   . Diabetes type II Mother   . CAD Father     Social History Social History   Tobacco Use  . Smoking status: Former Smoker    Packs/day: 0.00  . Smokeless tobacco: Never Used  Substance Use Topics  . Alcohol use: No  . Drug use: No     Allergies   Patient has no known allergies.  Review of Systems Review of Systems  Cardiovascular: Positive for chest pain.  All other systems reviewed and are negative.    Physical Exam Updated Vital Signs BP (!) 110/58 (BP Location: Right Arm)   Pulse 83   Temp 99.1 F (37.3 C) (Oral)   Resp 19   SpO2 97%   Physical Exam  Constitutional: He appears well-developed and well-nourished.  HENT:  Head: Normocephalic and atraumatic.  Eyes: EOM are normal. Pupils are equal, round, and reactive to light.  Neck: Normal range of motion. Neck supple.  Cardiovascular: Normal rate, regular rhythm and normal pulses.  Pulmonary/Chest: Effort normal and breath sounds normal.  Abdominal: Soft. Bowel sounds are normal.  Musculoskeletal: Normal range of motion.       Right lower leg: Normal.       Left lower leg: Normal.    Neurological: He is alert.  Skin: Skin is warm and dry. Capillary refill takes less than 2 seconds.  Psychiatric: He has a normal mood and affect.  Nursing note and vitals reviewed.    ED Treatments / Results  Labs (all labs ordered are listed, but only abnormal results are displayed) Labs Reviewed  BASIC METABOLIC PANEL - Abnormal; Notable for the following components:      Result Value   Glucose, Bld 108 (*)    Calcium 8.7 (*)    All other components within normal limits  CBC - Abnormal; Notable for the following components:   RDW 16.0 (*)    All other components within normal limits  TROPONIN I - Abnormal; Notable for the following components:   Troponin I 0.06 (*)    All other components within normal limits  D-DIMER, QUANTITATIVE (NOT AT [facility])    EKG  EKG Interpretation  Date/Time:  Tuesday November 23 2016 12:00:43 EST Ventricular Rate:  86 PR Interval:    QRS Duration: 100 QT Interval:  360 QTC Calculation: 431 R Axis:   52 Text Interpretation:  Sinus rhythm Confirmed by Jacalyn LefevreHaviland, Perline Awe [phone]([id number]) on 11/23/2016 12:44:50 PM       Radiology Dg Chest 2 View  Result Date: 11/23/2016 CLINICAL DATA:  Chest pain. EXAM: CHEST  2 VIEW COMPARISON:  06/01/2014 . FINDINGS: Mediastinum and hilar structures normal. Lungs are clear. No focal infiltrate. No pleural effusion or pneumothorax. Heart size normal. IMPRESSION: No acute cardiopulmonary disease. Electronically Signed   By: Maisie Fushomas  Register   On: 11/23/2016 13:19    Procedures Procedures (including critical care time)  Medications Ordered in ED Medications - No data to display   Initial Impression / Assessment and Plan / ED Course  I have reviewed the triage vital signs and the nursing notes.  Pertinent labs & imaging results that were available during my care of the patient were reviewed by me and considered in my medical decision making (see chart for details).  Heart score: 4    Pt has no cp now.  He did  have an elevated troponin 2 years ago, so I am not sure if he has a chronic elevation or if this is new.  Pt d/w Dr. Konrad DoloresMerrell (triad) for admission.    Final Clinical Impressions(s) / ED Diagnoses   Final diagnoses:  Unstable angina [facility]HCC)    ED Discharge Orders    None       Jacalyn LefevreHaviland, Sekai Nayak, MD 11/23/16 1411

## 2016-11-23 NOTE — ED Triage Notes (Addendum)
Pt arrived via [facility] c/o chest pain since 09:30 today with radiation to the left arm and jaw. Pt stated he went to the gym this morning and began feeling badly after working out. Pt states he was light headed and felt a tightening of the chest and nausea. Stated pain was 8/10. EMS administered 3 SL Nitro and 324 ASA PTA. Pt pain reduced to 4/10. Pt has hx of TIA and DM2. Pt stated he took supplements prior to his workout, which he takes regularly prior to working out.

## 2016-11-23 NOTE — H&P (Signed)
History and Physical    [name] Holland ZOX:[id number] DOB: [birth year] DOA: 11/23/2016  PCP: Willow OraAndy, Camille L, MD Patient coming from: home  Chief Complaint: CP  HPI: [name] SorCarlos M Bitterman is a 46 y.o. male with medical history significant of diabetes, DVT, CVA.  Patient reports acute onset chest pain. Substernal radiation to the left arm and with mild associated nausea. Initially described as dull achy pressure. Came on at approximately 09:30 immediately after his cross fit workout. Patient endorses his normal 15 minute cross fit workout. Patient drove himself home and took a shower at which point the pain started to change and radiated all the way down to his left wrist and jaw. EMS was called and patient was given 3 nitroglycerin and an aspirin with significant improvement in his symptoms. Denies any orthopnea, lower extremity edema, shortness of breath, dictations, fevers, cough, neck stiffness, headache, focal neurological deficits. Patient reports previous to being evaluated by cardiology and being asked to have a stress test for which patient did not schedule. During episode pain was described as constant and unchanged with certain body movements or respirations. Patient has had pain like this in the distant past when using illegal drugs but denies any use of sedative drugs at this time. Patient uses smokeless tobacco and denies any alcohol use. Patient does use 400 mg of caffeine prior to every cross fit workout which is unchanged from his normal caffeine intake. On non-workout days patient will drink a couple of caffeinated beverages over the course of the day.   ED Course: Objective findings outlined below.  Review of Systems: As per HPI otherwise all other systems reviewed and are negative  Ambulatory Status: No restrictions  Past Medical History:  Diagnosis Date  . Diabetes mellitus without complication (HCC)   . DVT (deep venous thrombosis) (HCC)   . Stroke [facility](HCC)     Past Surgical  History:  Procedure Laterality Date  . KNEE ARTHROSCOPY      Social History   Socioeconomic History  . Marital status: Legally Separated    Spouse name: Not on file  . Number of children: Not on file  . Years of education: Not on file  . Highest education level: Not on file  Social Needs  . Financial resource strain: Not on file  . Food insecurity - worry: Not on file  . Food insecurity - inability: Not on file  . Transportation needs - medical: Not on file  . Transportation needs - non-medical: Not on file  Occupational History  . Not on file  Tobacco Use  . Smoking status: Former Smoker    Packs/day: 0.00  . Smokeless tobacco: Never Used  Substance and Sexual Activity  . Alcohol use: No  . Drug use: No  . Sexual activity: Not Currently  Other Topics Concern  . Not on file  Social History Narrative  . Not on file    No Known Allergies  Family History  Problem Relation Age of Onset  . Other Mother   . Diabetes type II Mother   . CAD Father       Prior to Admission medications   Medication Sig Start Date End Date Taking? Authorizing Provider  ibuprofen (ADVIL,MOTRIN) 200 MG tablet Take 400 mg every 6 (six) hours as needed by mouth for moderate pain.   Yes [provider]  ezetimibe (ZETIA) 10 MG tablet Take 1 tablet (10 mg total) by mouth daily. Patient not taking: Reported on 08/31/2016 06/02/14   Marlin CanaryVann, Jessica  U, DO  metFORMIN (GLUCOPHAGE) 500 MG tablet Take 1 tablet (500 mg total) by mouth daily with breakfast. Patient not taking: Reported on 08/31/2016 06/02/14   Joseph Art, DO  Rivaroxaban (XARELTO STARTER PACK) 15 & 20 MG TBPK Take as directed on package: Start with one 15mg  tablet by mouth twice a day with food thru 6/12. On 6/13 switch to one 20mg  tablet once a day with food. Patient not taking: Reported on 08/31/2016 06/02/16   Cleora Fleet, MD    Physical Exam: Vitals:   11/23/16 1159  BP: (!) 110/58  Pulse: 83  Resp: 19  Temp: 99.1  F (37.3 C)  TempSrc: Oral  SpO2: 97%     General:  Appears calm and comfortable Eyes:  PERRL, EOMI, normal lids, iris ENT:  grossly normal hearing, lips & tongue, mmm Neck:  no LAD, masses or thyromegaly Cardiovascular:  RRR, no m/r/g. No LE edema.  Respiratory:  CTA bilaterally, no w/r/r. Normal respiratory effort. Abdomen:  soft, ntnd, NABS Skin:  no rash or induration seen on limited exam Musculoskeletal:  grossly normal tone BUE/BLE, good ROM, no bony abnormality Psychiatric:  grossly normal mood and affect, speech fluent and appropriate, AOx3 Neurologic:  CN 2-12 grossly intact, moves all extremities in coordinated fashion, sensation intact  Labs on Admission: I have personally reviewed following labs and imaging studies  CBC: Recent Labs  Lab 11/23/16 1226  WBC 10.5  HGB 14.1  HCT 43.5  MCV 87.2  PLT 214   Basic Metabolic Panel: Recent Labs  Lab 11/23/16 1226  NA 135  K 3.6  CL 105  CO2 22  GLUCOSE 108*  BUN 13  CREATININE 0.99  CALCIUM 8.7*   GFR: CrCl cannot be calculated (Unknown ideal weight.). Liver Function Tests: No results for input(s): AST, ALT, ALKPHOS, BILITOT, PROT, ALBUMIN in the last 168 hours. No results for input(s): LIPASE, AMYLASE in the last 168 hours. No results for input(s): AMMONIA in the last 168 hours. Coagulation Profile: No results for input(s): INR, PROTIME in the last 168 hours. Cardiac Enzymes: Recent Labs  Lab 11/23/16 1226  TROPONINI 0.06*   BNP (last 3 results) No results for input(s): PROBNP in the last 8760 hours. HbA1C: No results for input(s): HGBA1C in the last 72 hours. CBG: No results for input(s): GLUCAP in the last 168 hours. Lipid Profile: No results for input(s): CHOL, HDL, LDLCALC, TRIG, CHOLHDL, LDLDIRECT in the last 72 hours. Thyroid Function Tests: No results for input(s): TSH, T4TOTAL, FREET4, T3FREE, THYROIDAB in the last 72 hours. Anemia Panel: No results for input(s): VITAMINB12, FOLATE,  FERRITIN, TIBC, IRON, RETICCTPCT in the last 72 hours. Urine analysis:    Component Value Date/Time   COLORURINE YELLOW 05/31/2014 2331   APPEARANCEUR CLEAR 05/31/2014 2331   LABSPEC >1.046 (H) 05/31/2014 2331   PHURINE 5.5 05/31/2014 2331   GLUCOSEU 100 (A) 05/31/2014 2331   HGBUR TRACE (A) 05/31/2014 2331   BILIRUBINUR NEGATIVE 05/31/2014 2331   KETONESUR NEGATIVE 05/31/2014 2331   PROTEINUR 100 (A) 05/31/2014 2331   UROBILINOGEN 0.2 05/31/2014 2331   NITRITE NEGATIVE 05/31/2014 2331   LEUKOCYTESUR NEGATIVE 05/31/2014 2331    Creatinine Clearance: CrCl cannot be calculated (Unknown ideal weight.).  Sepsis Labs: @LABRCNTIP (procalcitonin:4,lacticidven:4) )No results found for this or any previous visit (from the past 240 hour(s)).   Radiological Exams on Admission: Dg Chest 2 View  Result Date: 11/23/2016 CLINICAL DATA:  Chest pain. EXAM: CHEST  2 VIEW COMPARISON:  06/01/2014 . FINDINGS: Mediastinum  and hilar structures normal. Lungs are clear. No focal infiltrate. No pleural effusion or pneumothorax. Heart size normal. IMPRESSION: No acute cardiopulmonary disease. Electronically Signed   By: Maisie Fus  Register   On: 11/23/2016 13:19    EKG: Independently reviewed. Nonspecific ST changes, and no ACS, sinus.  Assessment/Plan Active Problems:   History of CVA (cerebrovascular accident)   Chest pain   Prediabetes   History of DVT (deep vein thrombosis)     Chest pain: Cardiac etiology seems the most likely. Significant caffeine intake for Crossfit workouts is also a consideration. Troponin 0.06. Relieved with aspirin nitroglycerin and rest. See history of present illness for full description of chest pain and associated symptoms. No previous cardiac workup. Family history of coronary artery disease in his father. Selective symptoms in the past secondary to cocaine use the patient vehemently denies at this time.  - Tele - Cycle trop - EKG in am - Echo - Cardiology consult.  Outpatient workup versus inpatient stress test or cath.  - UDS - ASA   Diabetes/Prediabetes: Patient stopped all diabetes medicines approximately 2 hours prior to admission due to patient's A1c dropping below 6.5 and being told he can stop by his PCP. Glucose on admission 105. - Daily BMP  History of CVA: No deficits and likely related to drug use in the past   - Monitor   HIstory of DVT: pt stopped Xarelto 2 months prior to admission per his PCP recs. No sign of DVT or PE at this time. Ddimer nml and history given by Pt reassuring.     DVT prophylaxis: Hep  Code Status: FULL  Family Communication: mother  Disposition Plan: pending workup for CP  Consults called: Cardiology  Admission status: observation - tele    Ozella Rocks MD [facility]  If 7PM-7AM, please contact night-coverage [email or web address] Password [email or web address]  11/23/2016, 3:00 PM

## 2016-11-23 NOTE — H&P (View-Only) (Signed)
The patient has been seen in conjunction with Berton Bon, NP. All aspects of care have been considered and discussed. The patient has been personally interviewed, examined, and all clinical data has been reviewed.   Primary history of ischemic event 2009 associated with cocaine use.  Right coronary vasospasm was reversed with intracoronary nitroglycerin [name] Holland).  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  He presents now with significant chest discomfort occurring after heavy physical activity with radiation to the jaw and left wrist.  Exam is otherwise normal.  EKG does not reveal any acute ST-T wave change.  Troponin is 0.06.  Likely acute coronary syndrome/unstable angina  Plan coronary angiography to define anatomy and help guide therapy.  IV heparin and escalate to IV nitroglycerin if recurrent chest pain.  Coronary angiography risks and benefits were discussed in detail with the patient including 1 in 1000 risk of stroke, death, or myocardial infarction.  High risk of bleeding and if PCI performed 1 in 100 risk of myocardial injury/emergency surgery.    Cardiology Consultation:   Patient ID: [name] Holland; [id number]; February 06, 1970   Admit date: 11/23/2016 Date of Consult: 11/23/2016  Primary Care Provider: Willow Ora, MD Primary Cardiologist: New- Dr. Katrinka Holland  Patient Profile:   [name] Holland is a 46 y.o. male with a hx of DM, DVT, HLD, morbid obesity stroke who is being seen today for the evaluation of chest pain at the request of [name] Holland.  History of Present Illness:   [name] developed chest pain after exercising this morning with radiation to the jaw and left arm. He says that he had taken a pre-workout supplement that contains caffeine and then did an intense workout. He finished about 9:30. At about 9:45 when he  ws in his car he developed mild chest discomfort but ws not alarmed. He went home and showered. The discomfort became a little worse while in the shower so he went to lay down. It continued to get worse so he called EMS. He was given ASA and NTG X 3 which helped his pain.He had mild shortness of breath and very mild diaphoresis. He denies having had lightheadedness, palpitations, or nausea. He has not had any recent orthopnea, PND or edema. He had similar chest pain in the past when he was using cocaine.    He has history of cavernous sinus thrombosis and recurrent DVT for which he was previously anticoagulated with Xarelto. He says that he was told by his PCP that he could stop the anticoagulant in May, but his mother is saying that he stopped on his own. He had a negative hypercoagulable workup in May. Pt reports that he has had a normal cath at some point in the past - found in 2012.  He has a history of cocaine use, none in 5 years. He was a previous smoker about 1 PPD, having quit about 5 years ago. He denies alcohol use. He has decided to get healthy so has made diet changes and is exercising. He has lost about 75 pounds since June per the pt. He has been taken off his antidiabetic meds.   His father had bypass surgery in his 71's. His maternal uncle has an LVAD and his maternal grandfather had CAD. His mother is treated for angina but has no blockages per cath.   Past Medical History:  Diagnosis Date  . Diabetes  mellitus without complication (HCC)   . DVT (deep venous thrombosis) (HCC)   . Stroke [facility])     Past Surgical History:  Procedure Laterality Date  . KNEE ARTHROSCOPY       Home Medications:  Prior to Admission medications   Medication Sig Start Date End Date Taking? Authorizing Provider  ibuprofen (ADVIL,MOTRIN) 200 MG tablet Take 400 mg every 6 (six) hours as needed by mouth for moderate pain.   Yes [provider]  ezetimibe (ZETIA) 10 MG tablet Take 1 tablet (10 mg  total) by mouth daily. Patient not taking: Reported on 08/31/2016 06/02/14   [name] Art, DO    Inpatient Medications: Scheduled Meds:  Continuous Infusions:  PRN Meds:   Allergies:   No Known Allergies  Social History:   Social History   Socioeconomic History  . Marital status: Legally Separated    Spouse name: Not on file  . Number of children: Not on file  . Years of education: Not on file  . Highest education level: Not on file  Social Needs  . Financial resource strain: Not on file  . Food insecurity - worry: Not on file  . Food insecurity - inability: Not on file  . Transportation needs - medical: Not on file  . Transportation needs - non-medical: Not on file  Occupational History  . Not on file  Tobacco Use  . Smoking status: Former Smoker    Packs/day: 0.00  . Smokeless tobacco: Never Used  Substance and Sexual Activity  . Alcohol use: No  . Drug use: No  . Sexual activity: Not Currently  Other Topics Concern  . Not on file  Social History Narrative  . Not on file    Family History:    Family History  Problem Relation Age of Onset  . Other Mother   . Diabetes type II Mother   . CAD Father        CABG     ROS:  Please see the history of present illness.  ROS  All other ROS reviewed and negative.     Physical Exam/Data:   Vitals:   11/23/16 1159  BP: (!) 110/58  Pulse: 83  Resp: 19  Temp: 99.1 F (37.3 C)  TempSrc: Oral  SpO2: 97%   No intake or output data in the 24 hours ending 11/23/16 1605 There were no vitals filed for this visit. There is no height or weight on file to calculate BMI.  General:  Well nourished, well developed, in no acute distress HEENT: normal Lymph: no adenopathy Neck: no JVD Endocrine:  No thryomegaly Vascular: No carotid bruits; FA pulses 2+ bilaterally without bruits  Cardiac:  normal S1, S2; RRR; no murmur  Lungs:  clear to auscultation bilaterally, no wheezing, rhonchi or rales  Abd: soft,  nontender, no hepatomegaly  Ext: no edema Musculoskeletal:  No deformities, BUE and BLE strength normal and equal Skin: warm and dry  Neuro:  CNs 2-12 intact, no focal abnormalities noted Psych:  Normal affect   EKG:  The EKG was personally reviewed and demonstrates:  NSR without ischemic changes Telemetry:  Telemetry was personally reviewed and demonstrates:  NSR in the 60's  Relevant CV Studies:  Echocardiogram 05/2016 Study Conclusions  - Left ventricle: The cavity size was normal. There was mild   concentric hypertrophy. Systolic function was normal. The   estimated ejection fraction was in the range of 55% to 60%. Wall   motion was normal; there were no  regional wall motion   abnormalities. Left ventricular diastolic function parameters   were normal. - Left atrium: The atrium was mildly dilated.  LHC 11/09/2007 FINAL IMPRESSIONS:  1. Angiographically patent coronary arteries.  2. Normal left ventricular systolic function.  3. Proximal right coronary artery pseudostenosis related to catheter-      induced spasm of that vessel.  4. Successful spasm of the proximal right coronary artery with 200 mcg      intracoronary nitroglycerin.  5. Normal left ventricular systolic function.   PLAN:  [facility] and will be a candidate for discharge at the  time that is deemed appropriate by the [facility] Team.   Laboratory Data:  Chemistry Recent Labs  Lab 11/23/16 1226  NA 135  K 3.6  CL 105  CO2 22  GLUCOSE 108*  BUN 13  CREATININE 0.99  CALCIUM 8.7*  GFRNONAA >60  GFRAA >60  ANIONGAP 8    No results for input(s): PROT, ALBUMIN, AST, ALT, ALKPHOS, BILITOT in the last 168 hours. Hematology Recent Labs  Lab 11/23/16 1226  WBC 10.5  RBC 4.99  HGB 14.1  HCT 43.5  MCV 87.2  MCH 28.3  MCHC 32.4  RDW 16.0*  PLT 214   Cardiac Enzymes Recent Labs  Lab 11/23/16 1226  TROPONINI  0.06*   No results for input(s): TROPIPOC in the last 168 hours.  BNPNo results for input(s): BNP, PROBNP in the last 168 hours.  DDimer  Recent Labs  Lab 11/23/16 1226  DDIMER <0.27    Radiology/Studies:  Dg Chest 2 View  Result Date: 11/23/2016 CLINICAL DATA:  Chest pain. EXAM: CHEST  2 VIEW COMPARISON:  06/01/2014 . FINDINGS: Mediastinum and hilar structures normal. Lungs are clear. No focal infiltrate. No pleural effusion or pneumothorax. Heart size normal. IMPRESSION: No acute cardiopulmonary disease. Electronically Signed   By: Maisie Fushomas  Register   On: 11/23/2016 13:19    Assessment and Plan:   Chest pain:  Pt presented with post exercise chest pain that radiated to the jaw and left wrist and was associated with mild dyspnea and mild diaphoresis. Resolved with NTG and ASA in EMS. No further chest pain. 1st troponin mildly elevated at 0.06. Renal function is normal. D-dimer not elevated. CXR normal. EKG without acute ischemic changes. CVD risk factors include obesity, DM, HLD, remote smoker, family history.  Hemodynamically stable. Continue to trend troponins. With his significant risk factors and description of symptoms, he may benefit from cath even if troponins do not elevate. Discussed with Dr. Katrinka BlazingSmith and arranged for cath tomorrow.    The patient understands that risks included but are not limited to stroke (1 in 1000), death (1 in 1000), kidney failure [usually temporary] (1 in 500), bleeding (1 in 200), allergic reaction [possibly serious] (1 in 200).   Hyperlipidemia:  LDL 138 in 05/2016. On Zetia. Thinks he tried statin in the past, he is unsure why he is not still on. Does not recall any problems taking it. With his DM and risk factors would recommend a statin.   Diabetes: Hgb A1c 6.9 in 05/2016.  Previously on metformin; Stopped taking- says he does not need since he lost significant wt.  May still benefit from metformin. SSI while hospitalized. Management per IM. May benefit  from an ACE-I for renal protection.   Hx of cavernous sinus thrombosis and recurrent DVT: Notes from neurology in 05/2016 indicate that pt should be  on life long anticoagulation. Pt stopped taking Xarelto- he says was told he could stop. He will need to restart Xarelto once no need for invasive procedures.   For questions or updates, please contact [facility] Please consult [email or web address] for contact info under Cardiology/STEMI.   Signed, Berton Bon, NP  11/23/2016 4:05 PM

## 2016-11-23 NOTE — Consult Note (Signed)
The patient has been seen in conjunction with Berton Bon, NP. All aspects of care have been considered and discussed. The patient has been personally interviewed, examined, and all clinical data has been reviewed.   Primary history of ischemic event 2009 associated with cocaine use.  Right coronary vasospasm was reversed with intracoronary nitroglycerin [name] Holland).  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  He presents now with significant chest discomfort occurring after heavy physical activity with radiation to the jaw and left wrist.  Exam is otherwise normal.  EKG does not reveal any acute ST-T wave change.  Troponin is 0.06.  Likely acute coronary syndrome/unstable angina  Plan coronary angiography to define anatomy and help guide therapy.  IV heparin and escalate to IV nitroglycerin if recurrent chest pain.  Coronary angiography risks and benefits were discussed in detail with the patient including 1 in 1000 risk of stroke, death, or myocardial infarction.  High risk of bleeding and if PCI performed 1 in 100 risk of myocardial injury/emergency surgery.    Cardiology Consultation:   Patient ID: [name] Holland; February 06, 1970   Admit date: 11/23/2016 Date of Consult: 11/23/2016  Primary Care Provider: Willow Ora, MD Primary Cardiologist: New- Dr. Katrinka Blazing  Patient Profile:   [name] Holland is a 46 y.o. male with a hx of DM, DVT, HLD, morbid obesity stroke who is being seen today for the evaluation of chest pain at the request of Dr. Konrad Dolores.  History of Present Illness:   [name] Holland developed chest pain after exercising this morning with radiation to the jaw and left arm. He says that he had taken a pre-workout supplement that contains caffeine and then did an intense workout. He finished about 9:30. At about 9:45 when he  ws in his car he developed mild chest discomfort but ws not alarmed. He went home and showered. The discomfort became a little worse while in the shower so he went to lay down. It continued to get worse so he called EMS. He was given ASA and NTG X 3 which helped his pain.He had mild shortness of breath and very mild diaphoresis. He denies having had lightheadedness, palpitations, or nausea. He has not had any recent orthopnea, PND or edema. He had similar chest pain in the past when he was using cocaine.    He has history of cavernous sinus thrombosis and recurrent DVT for which he was previously anticoagulated with Xarelto. He says that he was told by his PCP that he could stop the anticoagulant in May, but his mother is saying that he stopped on his own. He had a negative hypercoagulable workup in May. Pt reports that he has had a normal cath at some point in the past - found in 2012.  He has a history of cocaine use, none in 5 years. He was a previous smoker about 1 PPD, having quit about 5 years ago. He denies alcohol use. He has decided to get healthy so has made diet changes and is exercising. He has lost about 75 pounds since June per the pt. He has been taken off his antidiabetic meds.   His father had bypass surgery in his 71's. His maternal uncle has an LVAD and his maternal grandfather had CAD. His mother is treated for angina but has no blockages per cath.   Past Medical History:  Diagnosis Date  . Diabetes  mellitus without complication (HCC)   . DVT (deep venous thrombosis) (HCC)   . Stroke [facility])     Past Surgical History:  Procedure Laterality Date  . KNEE ARTHROSCOPY       Home Medications:  Prior to Admission medications   Medication Sig Start Date End Date Taking? Authorizing Provider  ibuprofen (ADVIL,MOTRIN) 200 MG tablet Take 400 mg every 6 (six) hours as needed by mouth for moderate pain.   Yes [provider]  ezetimibe (ZETIA) 10 MG tablet Take 1 tablet (10 mg  total) by mouth daily. Patient not taking: Reported on 08/31/2016 06/02/14   Joseph Art, DO    Inpatient Medications: Scheduled Meds:  Continuous Infusions:  PRN Meds:   Allergies:   No Known Allergies  Social History:   Social History   Socioeconomic History  . Marital status: Legally Separated    Spouse name: Not on file  . Number of children: Not on file  . Years of education: Not on file  . Highest education level: Not on file  Social Needs  . Financial resource strain: Not on file  . Food insecurity - worry: Not on file  . Food insecurity - inability: Not on file  . Transportation needs - medical: Not on file  . Transportation needs - non-medical: Not on file  Occupational History  . Not on file  Tobacco Use  . Smoking status: Former Smoker    Packs/day: 0.00  . Smokeless tobacco: Never Used  Substance and Sexual Activity  . Alcohol use: No  . Drug use: No  . Sexual activity: Not Currently  Other Topics Concern  . Not on file  Social History Narrative  . Not on file    Family History:    Family History  Problem Relation Age of Onset  . Other Mother   . Diabetes type II Mother   . CAD Father        CABG     ROS:  Please see the history of present illness.  ROS  All other ROS reviewed and negative.     Physical Exam/Data:   Vitals:   11/23/16 1159  BP: (!) 110/58  Pulse: 83  Resp: 19  Temp: 99.1 F (37.3 C)  TempSrc: Oral  SpO2: 97%   No intake or output data in the 24 hours ending 11/23/16 1605 There were no vitals filed for this visit. There is no height or weight on file to calculate BMI.  General:  Well nourished, well developed, in no acute distress HEENT: normal Lymph: no adenopathy Neck: no JVD Endocrine:  No thryomegaly Vascular: No carotid bruits; FA pulses 2+ bilaterally without bruits  Cardiac:  normal S1, S2; RRR; no murmur  Lungs:  clear to auscultation bilaterally, no wheezing, rhonchi or rales  Abd: soft,  nontender, no hepatomegaly  Ext: no edema Musculoskeletal:  No deformities, BUE and BLE strength normal and equal Skin: warm and dry  Neuro:  CNs 2-12 intact, no focal abnormalities noted Psych:  Normal affect   EKG:  The EKG was personally reviewed and demonstrates:  NSR without ischemic changes Telemetry:  Telemetry was personally reviewed and demonstrates:  NSR in the 60's  Relevant CV Studies:  Echocardiogram 05/2016 Study Conclusions  - Left ventricle: The cavity size was normal. There was mild   concentric hypertrophy. Systolic function was normal. The   estimated ejection fraction was in the range of 55% to 60%. Wall   motion was normal; there were no  regional wall motion   abnormalities. Left ventricular diastolic function parameters   were normal. - Left atrium: The atrium was mildly dilated.  LHC 11/09/2007 FINAL IMPRESSIONS:  1. Angiographically patent coronary arteries.  2. Normal left ventricular systolic function.  3. Proximal right coronary artery pseudostenosis related to catheter-      induced spasm of that vessel.  4. Successful spasm of the proximal right coronary artery with 200 mcg      intracoronary nitroglycerin.  5. Normal left ventricular systolic function.   PLAN:  [facility] and will be a candidate for discharge at the  time that is deemed appropriate by the [facility] Team.   Laboratory Data:  Chemistry Recent Labs  Lab 11/23/16 1226  NA 135  K 3.6  CL 105  CO2 22  GLUCOSE 108*  BUN 13  CREATININE 0.99  CALCIUM 8.7*  GFRNONAA >60  GFRAA >60  ANIONGAP 8    No results for input(s): PROT, ALBUMIN, AST, ALT, ALKPHOS, BILITOT in the last 168 hours. Hematology Recent Labs  Lab 11/23/16 1226  WBC 10.5  RBC 4.99  HGB 14.1  HCT 43.5  MCV 87.2  MCH 28.3  MCHC 32.4  RDW 16.0*  PLT 214   Cardiac Enzymes Recent Labs  Lab 11/23/16 1226  TROPONINI  0.06*   No results for input(s): TROPIPOC in the last 168 hours.  BNPNo results for input(s): BNP, PROBNP in the last 168 hours.  DDimer  Recent Labs  Lab 11/23/16 1226  DDIMER <0.27    Radiology/Studies:  Dg Chest 2 View  Result Date: 11/23/2016 CLINICAL DATA:  Chest pain. EXAM: CHEST  2 VIEW COMPARISON:  06/01/2014 . FINDINGS: Mediastinum and hilar structures normal. Lungs are clear. No focal infiltrate. No pleural effusion or pneumothorax. Heart size normal. IMPRESSION: No acute cardiopulmonary disease. Electronically Signed   By: Maisie Fushomas  Register   On: 11/23/2016 13:19    Assessment and Plan:   Chest pain:  Pt presented with post exercise chest pain that radiated to the jaw and left wrist and was associated with mild dyspnea and mild diaphoresis. Resolved with NTG and ASA in EMS. No further chest pain. 1st troponin mildly elevated at 0.06. Renal function is normal. D-dimer not elevated. CXR normal. EKG without acute ischemic changes. CVD risk factors include obesity, DM, HLD, remote smoker, family history.  Hemodynamically stable. Continue to trend troponins. With his significant risk factors and description of symptoms, he may benefit from cath even if troponins do not elevate. Discussed with Dr. Katrinka BlazingSmith and arranged for cath tomorrow.    The patient understands that risks included but are not limited to stroke (1 in 1000), death (1 in 1000), kidney failure [usually temporary] (1 in 500), bleeding (1 in 200), allergic reaction [possibly serious] (1 in 200).   Hyperlipidemia:  LDL 138 in 05/2016. On Zetia. Thinks he tried statin in the past, he is unsure why he is not still on. Does not recall any problems taking it. With his DM and risk factors would recommend a statin.   Diabetes: Hgb A1c 6.9 in 05/2016.  Previously on metformin; Stopped taking- says he does not need since he lost significant wt.  May still benefit from metformin. SSI while hospitalized. Management per IM. May benefit  from an ACE-I for renal protection.   Hx of cavernous sinus thrombosis and recurrent DVT: Notes from neurology in 05/2016 indicate that pt should be  on life long anticoagulation. Pt stopped taking Xarelto- he says was told he could stop. He will need to restart Xarelto once no need for invasive procedures.   For questions or updates, please contact [facility] Please consult [email or web address] for contact info under Cardiology/STEMI.   Signed, Berton Bon, NP  11/23/2016 4:05 PM

## 2016-11-23 NOTE — ED Notes (Signed)
Ordered dinner 

## 2016-11-24 ENCOUNTER — Observation Stay (HOSPITAL_BASED_OUTPATIENT_CLINIC_OR_DEPARTMENT_OTHER): Payer: BLUE CROSS/BLUE SHIELD

## 2016-11-24 ENCOUNTER — Encounter (HOSPITAL_COMMUNITY)
Admission: EM | Disposition: A | Discharge status: Home / Self Care | Payer: Self-pay | Source: Home / Self Care | Attending: Internal Medicine

## 2016-11-24 ENCOUNTER — Encounter (HOSPITAL_COMMUNITY): Payer: Self-pay | Admitting: Cardiovascular Disease

## 2016-11-24 DIAGNOSIS — Z7982 Long term (current) use of aspirin: Secondary | ICD-10-CM | POA: Diagnosis not present

## 2016-11-24 DIAGNOSIS — R079 Chest pain, unspecified: Secondary | ICD-10-CM

## 2016-11-24 DIAGNOSIS — Z8673 Personal history of transient ischemic attack (TIA), and cerebral infarction without residual deficits: Secondary | ICD-10-CM | POA: Diagnosis not present

## 2016-11-24 DIAGNOSIS — Z7984 Long term (current) use of oral hypoglycemic drugs: Secondary | ICD-10-CM | POA: Diagnosis not present

## 2016-11-24 DIAGNOSIS — I200 Unstable angina: Secondary | ICD-10-CM | POA: Diagnosis present

## 2016-11-24 DIAGNOSIS — Z8249 Family history of ischemic heart disease and other diseases of the circulatory system: Secondary | ICD-10-CM | POA: Diagnosis not present

## 2016-11-24 DIAGNOSIS — I214 Non-ST elevation (NSTEMI) myocardial infarction: Secondary | ICD-10-CM

## 2016-11-24 DIAGNOSIS — R7303 Prediabetes: Secondary | ICD-10-CM | POA: Diagnosis not present

## 2016-11-24 DIAGNOSIS — Z6838 Body mass index (BMI) 38.0-38.9, adult: Secondary | ICD-10-CM | POA: Diagnosis not present

## 2016-11-24 DIAGNOSIS — E785 Hyperlipidemia, unspecified: Secondary | ICD-10-CM | POA: Diagnosis present

## 2016-11-24 DIAGNOSIS — Z86718 Personal history of other venous thrombosis and embolism: Secondary | ICD-10-CM | POA: Diagnosis not present

## 2016-11-24 DIAGNOSIS — I25110 Atherosclerotic heart disease of native coronary artery with unstable angina pectoris: Secondary | ICD-10-CM | POA: Diagnosis present

## 2016-11-24 DIAGNOSIS — E118 Type 2 diabetes mellitus with unspecified complications: Secondary | ICD-10-CM | POA: Diagnosis present

## 2016-11-24 DIAGNOSIS — Z79899 Other long term (current) drug therapy: Secondary | ICD-10-CM | POA: Diagnosis not present

## 2016-11-24 HISTORY — PX: CORONARY STENT INTERVENTION: CATH118234

## 2016-11-24 HISTORY — PX: CORONARY THROMBECTOMY: CATH118304

## 2016-11-24 HISTORY — PX: LEFT HEART CATH AND CORONARY ANGIOGRAPHY: CATH118249

## 2016-11-24 LAB — HEMOGLOBIN A1C
HEMOGLOBIN A1C: 5.7 % — AB (ref 4.8–5.6)
Mean Plasma Glucose: 116.89 mg/dL

## 2016-11-24 LAB — BASIC METABOLIC PANEL
ANION GAP: 7 (ref 5–15)
BUN: 15 mg/dL (ref 6–20)
CALCIUM: 8.8 mg/dL — AB (ref 8.9–10.3)
CO2: 22 mmol/L (ref 22–32)
Chloride: 108 mmol/L (ref 101–111)
Creatinine, Ser: 0.94 mg/dL (ref 0.61–1.24)
GFR calc Af Amer: >60 mL/min (ref >60)
GFR calc non Af Amer: >60 mL/min (ref >60)
GLUCOSE: 126 mg/dL — AB (ref 65–99)
POTASSIUM: 4 mmol/L (ref 3.5–5.1)
Sodium: 137 mmol/L (ref 135–145)

## 2016-11-24 LAB — POCT ACTIVATED CLOTTING TIME: ACTIVATED CLOTTING TIME: 279 s

## 2016-11-24 LAB — PLATELET COUNT: Platelets: 187 10*3/uL (ref 150–400)

## 2016-11-24 LAB — GLUCOSE, CAPILLARY
GLUCOSE-CAPILLARY: 127 mg/dL — AB (ref 65–99)
GLUCOSE-CAPILLARY: 132 mg/dL — AB (ref 65–99)
Glucose-Capillary: 99 mg/dL (ref 65–99)
Glucose-Capillary: 82 mg/dL (ref 65–99)

## 2016-11-24 LAB — HEPARIN LEVEL (UNFRACTIONATED): Heparin Unfractionated: 0.19 IU/mL — ABNORMAL LOW (ref 0.30–0.70)

## 2016-11-24 LAB — ECHOCARDIOGRAM COMPLETE
HEIGHTINCHES: 73 in
WEIGHTICAEL: 4702.4 [oz_av]

## 2016-11-24 LAB — CBC
HEMATOCRIT: 44.2 % (ref 39.0–52.0)
Hemoglobin: 14.4 g/dL (ref 13.0–17.0)
MCH: 28.3 pg (ref 26.0–34.0)
MCHC: 32.6 g/dL (ref 30.0–36.0)
MCV: 87 fL (ref 78.0–100.0)
Platelets: 211 10*3/uL (ref 150–400)
RBC: 5.08 MIL/uL (ref 4.22–5.81)
RDW: 15.9 % — AB (ref 11.5–15.5)
WBC: 7.6 10*3/uL (ref 4.0–10.5)

## 2016-11-24 SURGERY — LEFT HEART CATH AND CORONARY ANGIOGRAPHY
Anesthesia: LOCAL

## 2016-11-24 MED ORDER — SODIUM CHLORIDE 0.9% FLUSH: 3.0000 mL | Freq: Two times a day (BID) | INTRAVENOUS | Status: DC

## 2016-11-24 MED ORDER — HEPARIN (PORCINE) IN NACL 100-0.45 UNIT/ML-% IJ SOLN
1600.0000 [IU]/h | INTRAMUSCULAR | Status: DC
  Administered 2016-11-24: 1600 [IU]/h via INTRAVENOUS
  Filled 2016-11-24: qty 250

## 2016-11-24 MED ORDER — LABETALOL HCL 5 MG/ML IV SOLN: 10.0000 mg | INTRAVENOUS | Status: AC | PRN

## 2016-11-24 MED ORDER — SODIUM CHLORIDE 0.9% FLUSH: 3.0000 mL | INTRAVENOUS | Status: DC | PRN

## 2016-11-24 MED ORDER — SODIUM CHLORIDE 0.9 % IV SOLN
INTRAVENOUS | Status: AC
  Administered 2016-11-24: 13:00:00 via INTRAVENOUS

## 2016-11-24 MED ORDER — LIDOCAINE HCL (PF) 1 % IJ SOLN
INTRAMUSCULAR | Status: AC
  Filled 2016-11-24: qty 30

## 2016-11-24 MED ORDER — SODIUM CHLORIDE 0.9 % IV SOLN
INTRAVENOUS | Status: DC | PRN
  Administered 2016-11-24 (×2): 1.75 mg/kg/h via INTRAVENOUS

## 2016-11-24 MED ORDER — LIDOCAINE HCL (PF) 1 % IJ SOLN
INTRAMUSCULAR | Status: DC | PRN
  Administered 2016-11-24: 15 mL
  Administered 2016-11-24: 2 mL

## 2016-11-24 MED ORDER — TICAGRELOR 90 MG PO TABS
ORAL_TABLET | ORAL | Status: AC
  Filled 2016-11-24: qty 2

## 2016-11-24 MED ORDER — MIDAZOLAM HCL 2 MG/2ML IJ SOLN
INTRAMUSCULAR | Status: AC
  Filled 2016-11-24: qty 2

## 2016-11-24 MED ORDER — ATORVASTATIN CALCIUM 80 MG PO TABS
80.0000 mg | ORAL_TABLET | Freq: Every day | ORAL | Status: DC
  Administered 2016-11-24: 17:00:00 80 mg via ORAL
  Filled 2016-11-24: qty 1

## 2016-11-24 MED ORDER — HYDRALAZINE HCL 20 MG/ML IJ SOLN: 5.0000 mg | INTRAMUSCULAR | Status: AC | PRN

## 2016-11-24 MED ORDER — BIVALIRUDIN TRIFLUOROACETATE 250 MG IV SOLR
INTRAVENOUS | Status: AC
  Filled 2016-11-24: qty 250

## 2016-11-24 MED ORDER — IOPAMIDOL (ISOVUE-370) INJECTION 76%
INTRAVENOUS | Status: AC
  Filled 2016-11-24: qty 100

## 2016-11-24 MED ORDER — MIDAZOLAM HCL 2 MG/2ML IJ SOLN
INTRAMUSCULAR | Status: DC | PRN
  Administered 2016-11-24: 2 mg via INTRAVENOUS
  Administered 2016-11-24: 1 mg via INTRAVENOUS

## 2016-11-24 MED ORDER — TICAGRELOR 90 MG PO TABS
ORAL_TABLET | ORAL | Status: DC | PRN
  Administered 2016-11-24: 180 mg via ORAL

## 2016-11-24 MED ORDER — TICAGRELOR 90 MG PO TABS
90.0000 mg | ORAL_TABLET | Freq: Two times a day (BID) | ORAL | Status: DC
  Administered 2016-11-24: 90 mg via ORAL
  Filled 2016-11-24 (×2): qty 1

## 2016-11-24 MED ORDER — SODIUM CHLORIDE 0.9 % IV SOLN: 250.0000 mL | INTRAVENOUS | Status: DC | PRN

## 2016-11-24 MED ORDER — VERAPAMIL HCL 2.5 MG/ML IV SOLN
INTRAVENOUS | Status: AC
  Filled 2016-11-24: qty 2

## 2016-11-24 MED ORDER — HEPARIN SODIUM (PORCINE) 1000 UNIT/ML IJ SOLN
INTRAMUSCULAR | Status: AC
  Filled 2016-11-24: qty 1

## 2016-11-24 MED ORDER — FENTANYL CITRATE (PF) 100 MCG/2ML IJ SOLN
INTRAMUSCULAR | Status: DC | PRN
  Administered 2016-11-24: 50 ug via INTRAVENOUS
  Administered 2016-11-24: 25 ug via INTRAVENOUS

## 2016-11-24 MED ORDER — HEPARIN (PORCINE) IN NACL 2-0.9 UNIT/ML-% IJ SOLN
INTRAMUSCULAR | Status: AC | PRN
  Administered 2016-11-24: 1000 mL
  Administered 2016-11-24: 500 mL

## 2016-11-24 MED ORDER — HEART ATTACK BOUNCING BOOK
Freq: Once | Status: AC
  Administered 2016-11-24: 20:00:00
  Filled 2016-11-24: qty 1

## 2016-11-24 MED ORDER — BIVALIRUDIN BOLUS VIA INFUSION - CUPID
INTRAVENOUS | Status: DC | PRN
  Administered 2016-11-24: 99.975 mg via INTRAVENOUS

## 2016-11-24 MED ORDER — TIROFIBAN (AGGRASTAT) BOLUS VIA INFUSION
INTRAVENOUS | Status: DC | PRN
  Administered 2016-11-24: 3332.5 ug via INTRAVENOUS

## 2016-11-24 MED ORDER — THE SENSUOUS HEART BOOK
Freq: Once | Status: AC
  Administered 2016-11-24: 21:00:00
  Filled 2016-11-24 (×2): qty 1

## 2016-11-24 MED ORDER — TIROFIBAN HCL IN NACL 5-0.9 MG/100ML-% IV SOLN
0.1500 ug/kg/min | INTRAVENOUS | Status: AC
  Administered 2016-11-24 – 2016-11-25 (×2): 0.15 ug/kg/min via INTRAVENOUS
  Filled 2016-11-24 (×5): qty 100

## 2016-11-24 MED ORDER — HEPARIN BOLUS VIA INFUSION
4000.0000 [IU] | Freq: Once | INTRAVENOUS | Status: AC
  Administered 2016-11-24: 4000 [IU] via INTRAVENOUS
  Filled 2016-11-24: qty 4000

## 2016-11-24 MED ORDER — HEPARIN (PORCINE) IN NACL 2-0.9 UNIT/ML-% IJ SOLN
INTRAMUSCULAR | Status: AC
  Filled 2016-11-24: qty 500

## 2016-11-24 MED ORDER — ANGIOPLASTY BOOK
Freq: Once | Status: AC
  Administered 2016-11-24: 20:00:00
  Filled 2016-11-24: qty 1

## 2016-11-24 MED ORDER — IOPAMIDOL (ISOVUE-370) INJECTION 76%
INTRAVENOUS | Status: DC | PRN
  Administered 2016-11-24: 160 mL via INTRA_ARTERIAL

## 2016-11-24 MED ORDER — HEPARIN (PORCINE) IN NACL 2-0.9 UNIT/ML-% IJ SOLN
INTRAMUSCULAR | Status: DC | PRN
  Administered 2016-11-24: 10 mL via INTRA_ARTERIAL

## 2016-11-24 MED ORDER — TIROFIBAN HCL IN NACL 5-0.9 MG/100ML-% IV SOLN
INTRAVENOUS | Status: AC | PRN
  Administered 2016-11-24 (×2): 0.15 ug/kg/min via INTRAVENOUS

## 2016-11-24 MED ORDER — ASPIRIN EC 81 MG PO TBEC
81.0000 mg | DELAYED_RELEASE_TABLET | Freq: Every day | ORAL | Status: DC
  Administered 2016-11-25: 10:00:00 81 mg via ORAL
  Filled 2016-11-24: qty 1

## 2016-11-24 MED ORDER — TIROFIBAN HCL IN NACL 5-0.9 MG/100ML-% IV SOLN
INTRAVENOUS | Status: AC
  Filled 2016-11-24: qty 100

## 2016-11-24 MED ORDER — FENTANYL CITRATE (PF) 100 MCG/2ML IJ SOLN
INTRAMUSCULAR | Status: AC
  Filled 2016-11-24: qty 2

## 2016-11-24 SURGICAL SUPPLY — 21 items
CATH 5FR JL3.5 JR4 ANG PIG MP (CATHETERS) ×1 IMPLANT
CATH EXTRAC PRONTO 5.5F 138CM (CATHETERS) ×1 IMPLANT
CATH VISTA GUIDE 6FR XBLAD3.5 (CATHETERS) ×1 IMPLANT
DEVICE RAD COMP TR BAND LRG (VASCULAR PRODUCTS) ×1 IMPLANT
DEVICE WIRE ANGIOSEAL 6FR (Vascular Products) ×1 IMPLANT
GLIDESHEATH SLEND SS 6F .021 (SHEATH) ×2 IMPLANT
GUIDEWIRE ANGLED .035X150CM (WIRE) ×1 IMPLANT
GUIDEWIRE INQWIRE 1.5J.035X260 (WIRE) IMPLANT
INQWIRE 1.5J .035X260CM (WIRE) ×2
KIT ENCORE 26 ADVANTAGE (KITS) ×1 IMPLANT
KIT HEART LEFT (KITS) ×2 IMPLANT
PACK CARDIAC CATHETERIZATION (CUSTOM PROCEDURE TRAY) ×2 IMPLANT
SHEATH PINNACLE 5F 10CM (SHEATH) ×1 IMPLANT
SHEATH PINNACLE 6F 10CM (SHEATH) ×1 IMPLANT
STENT RESOLUTE ONYX 4.5X15 (Permanent Stent) ×1 IMPLANT
SYR MEDRAD MARK V 150ML (SYRINGE) ×2 IMPLANT
TRANSDUCER W/STOPCOCK (MISCELLANEOUS) ×2 IMPLANT
TUBING CIL FLEX 10 FLL-RA (TUBING) ×2 IMPLANT
TUBING CONTRAST HIGH PRESS 20 (MISCELLANEOUS) ×1 IMPLANT
WIRE COUGAR XT STRL 190CM (WIRE) ×1 IMPLANT
WIRE EMERALD 3MM-J .035X150CM (WIRE) ×1 IMPLANT

## 2016-11-24 NOTE — Interval H&P Note (Signed)
History and Physical Interval Note:  11/24/2016 10:06 AM  [name] Holland  has presented today for cardiac cath with the diagnosis of NSTEMI. The various methods of treatment have been discussed with the patient and family. After consideration of risks, benefits and other options for treatment, the patient has consented to  Procedure(s): LEFT HEART CATH AND CORONARY ANGIOGRAPHY (N/A) as a surgical intervention .  The patient's history has been reviewed, patient examined, no change in status, stable for surgery.  I have reviewed the patient's chart and labs.  Questions were answered to the patient's satisfaction.    Cath Lab Visit (complete for each Cath Lab visit)  Clinical Evaluation Leading to the Procedure:   ACS: Yes.    Non-ACS:    Anginal Classification: CCS III  Anti-ischemic medical therapy: No Therapy  Non-Invasive Test Results: No non-invasive testing performed  Prior CABG: No previous CABG         Verne Carrow

## 2016-11-24 NOTE — Progress Notes (Signed)
Patient arrived from Cath Lab to room 6C04.  A/O. Denies pain/SOB.  TR Band to right radial with 10cc air.  Right femoral site angioseal with dressing D/I.  Activity limitations reviewed with patient and family.  States understanding.  Stent card/angioseal pamphlet given to patient's mother at bedside.

## 2016-11-24 NOTE — Progress Notes (Signed)
Progress Note  Patient Name: [name] Holland Date of Encounter: 11/24/2016  Primary Cardiologist: [facility]  Sharran Caratachea  Subjective   No recurrence of discomfort overnight.  Currently in Cath Lab with Dr. Clifton James.  Inpatient Medications    Scheduled Meds: . [MAR Hold] aspirin EC  325 mg Oral Daily  . [MAR Hold] Influenza vac split quadrivalent PF  0.5 mL Intramuscular Tomorrow-1000  . [MAR Hold] sodium chloride flush  3 mL Intravenous Q12H  . sodium chloride flush  3 mL Intravenous Q12H   Continuous Infusions: . [MAR Hold] sodium chloride    . sodium chloride    . sodium chloride 1 mL/kg/hr (11/24/16 0800)  . heparin 1,600 Units/hr (11/24/16 0132)  . heparin     PRN Meds: [MAR Hold] sodium chloride, sodium chloride, [MAR Hold] acetaminophen, fentaNYL, [MAR Hold] gi cocktail, heparin, lidocaine (PF), midazolam, [MAR Hold] ondansetron **OR** [MAR Hold] ondansetron (ZOFRAN) IV, Radial Cocktail/Verapamil only, [MAR Hold] sodium chloride flush, sodium chloride flush   Vital Signs    Vitals:   11/23/16 2257 11/23/16 2321 11/24/16 0609 11/24/16 1014  BP:  121/62 104/87   Pulse:  65 (!) 55   Resp:  18 18   Temp:  98.6 F (37 C) 97.8 F (36.6 C)   TempSrc:  Oral Oral   SpO2:  97% 98% 98%  Weight: 292 lb 14.4 oz (132.9 kg) 292 lb 14.4 oz (132.9 kg) 293 lb 14.4 oz (133.3 kg)   Height: 6\' 1"  (1.854 m) 6\' 1"  (1.854 m)      Intake/Output Summary (Last 24 hours) at 11/24/2016 1045 Last data filed at 11/24/2016 0700 Gross per 24 hour  Intake 422.95 ml  Output 500 ml  Net -77.05 ml   Filed Weights   11/23/16 2257 11/23/16 2321 11/24/16 0609  Weight: 292 lb 14.4 oz (132.9 kg) 292 lb 14.4 oz (132.9 kg) 293 lb 14.4 oz (133.3 kg)    Telemetry    Telemetry did not demonstrate any significant arrhythmia- Personally Reviewed  ECG    Performed 11/24/2016 at 6:03 AM.  Baseline artifact is noted.  Generalized ST elevation is noted in the inferior anterolateral and lateral leads.  No  findings consistent with ongoing ischemia.  ST elevation could be early repolarization versus pericarditis versus- Personally Reviewed  Physical Exam  Not addressed as patient is in Cath Lab currently   Labs    Chemistry Recent Labs  Lab 11/23/16 1226 11/24/16 0631  NA 135 137  K 3.6 4.0  CL 105 108  CO2 22 22  GLUCOSE 108* 126*  BUN 13 15  CREATININE 0.99 0.94  CALCIUM 8.7* 8.8*  GFRNONAA >60 >60  GFRAA >60 >60  ANIONGAP 8 7     Hematology Recent Labs  Lab 11/23/16 1226 11/24/16 0631  WBC 10.5 7.6  RBC 4.99 5.08  HGB 14.1 14.4  HCT 43.5 44.2  MCV 87.2 87.0  MCH 28.3 28.3  MCHC 32.4 32.6  RDW 16.0* 15.9*  PLT 214 211    Cardiac Enzymes Recent Labs  Lab 11/23/16 1226 11/23/16 1617 11/23/16 2152  TROPONINI 0.06* 0.20* 0.45*   No results for input(s): TROPIPOC in the last 168 hours.   BNPNo results for input(s): BNP, PROBNP in the last 168 hours.   DDimer  Recent Labs  Lab 11/23/16 1226  DDIMER <0.27     Radiology    Dg Chest 2 View  Result Date: 11/23/2016 CLINICAL DATA:  Chest pain. EXAM: CHEST  2 VIEW COMPARISON:  06/01/2014 .  FINDINGS: Mediastinum and hilar structures normal. Lungs are clear. No focal infiltrate. No pleural effusion or pneumothorax. Heart size normal. IMPRESSION: No acute cardiopulmonary disease. Electronically Signed   By: Maisie Fushomas  Register   On: 11/23/2016 13:19    Cardiac Studies   No new data  Patient Profile     46 y.o. male with multiple risk factors obesity, type 2 diabetes mellitus on no therapy for coronary artery disease, prior ischemic event 2009, who presents now with ischemic quality chest discomfort and elevated troponins after heavy exercise.  Assessment & Plan    1.  Non-ST elevation myocardial infarction suspected.  Rule out demand ischemia related to heavy physical activity at his workout on yesterday.  Having cardiac catheterization currently.  Further management will be dependent upon findings. 2.   Type 2 diabetes mellitus, poorly controlled 3.  History of clotting: DVT and history of coronary sinus thrombosis.  Will need long-term anticoagulation therapy.  For questions or updates, please contact [facility] Please consult [email or web address] for contact info under Cardiology/STEMI.      Signed, Lesleigh NoeHenry W Pilot Prindle III, MD  11/24/2016, 10:45 AM

## 2016-11-24 NOTE — Progress Notes (Signed)
ANTICOAGULATION CONSULT NOTE - Initial Consult  Pharmacy Consult for Heparin Indication: chest pain/ACS  No Known Allergies  Patient Measurements: Height: 6\' 1"  (185.4 cm) Weight: 292 lb 14.4 oz (132.9 kg) IBW/kg (Calculated) : 79.9 Heparin Dosing Weight: 110 kg  Vital Signs: Temp: 98.6 F (37 C) (11/13 2321) Temp Source: Oral (11/13 2321) BP: 121/62 (11/13 2321) Pulse Rate: 65 (11/13 2321)  Labs: Recent Labs    11/23/16 1226 11/23/16 1617 11/23/16 2152  HGB 14.1  --   --   HCT 43.5  --   --   PLT 214  --   --   LABPROT 13.6  --   --   INR 1.05  --   --   CREATININE 0.99  --   --   TROPONINI 0.06* 0.20* 0.45*    Estimated Creatinine Clearance: 134.7 mL/min (by C-G formula based on SCr of 0.99 mg/dL).   Medical History: Past Medical History:  Diagnosis Date  . Arthritis    "knees" (11/23/2016)  . DVT (deep venous thrombosis) (HCC) 05/2016   LLE  . Myocardial infarction (HCC) 2016   "caused by blood clot in the left side of my head"  . Stroke (HCC) 05/2014   denies residual on 11/23/2016  . Type II diabetes mellitus (HCC)     Medications:  Medications Prior to Admission  Medication Sig Dispense Refill Last Dose  . ibuprofen (ADVIL,MOTRIN) 200 MG tablet Take 400 mg every 6 (six) hours as needed by mouth for moderate pain.   11/21/2016  . ezetimibe (ZETIA) 10 MG tablet Take 1 tablet (10 mg total) by mouth daily. (Patient not taking: Reported on 08/31/2016) 30 tablet 0 Not Taking at Unknown time    Assessment: 46 y.o. male with chest pain for heparin  Goal of Therapy:  Heparin level 0.3-0.7 units/ml Monitor platelets by anticoagulation protocol: Yes   Plan:  Heparin 4000 units IV bolus, then start heparin 1600 units/hr Check heparin level in 6 hours.   Mark Hassey, Gary Fleet 11/24/2016,12:56 AM

## 2016-11-24 NOTE — Care Management Note (Addendum)
Case Management Note  Patient Details  Name: STOKELY SOUTHWELL MRN: [id number] Date of Birth: [birth year]  Subjective/Objective:  From [facility] (group home- works there), s/p coronary stent intervention, will be on brilinta or ? plavix.  NCM awaiting benefit check. Patient is going to be on xarelto, NCM gave him the 30 day free coupon and the $10 co pay card.  He has PCP and medication coverage.  For dc today, will go to [employer] on [facility].                  Action/Plan: DC home  with Xarelto coupons.   Expected Discharge Date:                  Expected Discharge Plan:  Group Home  In-House Referral:     Discharge planning Services  CM Consult  Post Acute Care Choice:    Choice offered to:     DME Arranged:    DME Agency:     HH Arranged:    HH Agency:     Status of Service:  In process, will continue to follow  If discussed at Long Length of Stay Meetings, dates discussed:    Additional Comments:  Leone Haven, RN 11/24/2016, 4:20 PM

## 2016-11-24 NOTE — Progress Notes (Signed)
CARDIOLOGY FOLLOW-up  The digital images from the patient's coronary angiogram and intervention which was recently completed have been reviewed.  Globular thrombus is noted in the proximal LAD.  This acute coronary syndrome is now the third such thrombotic event in this patient's history which includes prior DVT and cavernous sinus thrombosis.  Plan: Plavix, aspirin, and Xarelto for 1 month then drop aspirin.  Plavix and Xarelto would then be continued for at least 6 months and preferably 12 months at which time the Plavix can be discontinued but will discuss in detail with the patient.  Xarelto should be lifelong.  Will discuss in detail with the patient.

## 2016-11-24 NOTE — Progress Notes (Signed)
  Echocardiogram 2D Echocardiogram has been performed.  [name] Holland F 11/24/2016, 2:58 PM

## 2016-11-24 NOTE — Progress Notes (Signed)
PROGRESS NOTE    [name] Holland  TRR:[id number] DOB: 1970/11/10 DOA: 11/23/2016 PCP: Willow Ora, MD   Outpatient Specialists:     Brief Narrative:  [name] Holland is a 46 y.o. male with medical history significant of diabetes, DVT, CVA.  Patient reports acute onset chest pain. Substernal radiation to the left arm and with mild associated nausea. Initially described as dull achy pressure. Came on at approximately 09:30 immediately after his cross fit workout. Patient endorses his normal 15 minute cross fit workout. Patient drove himself home and took a shower at which point the pain started to change and radiated all the way down to his left wrist and jaw. EMS was called and patient was given 3 nitroglycerin and an aspirin with significant improvement in his symptoms. Denies any orthopnea, lower extremity edema, shortness of breath, dictations, fevers, cough, neck stiffness, headache, focal neurological deficits. Patient reports previous to being evaluated by cardiology and being asked to have a stress test for which patient did not schedule. During episode pain was described as constant and unchanged with certain body movements or respirations. Patient has had pain like this in the distant past when using illegal drugs but denies any use of sedative drugs at this time. Patient uses smokeless tobacco and denies any alcohol use. Patient does use 400 mg of caffeine prior to every cross fit workout which is unchanged from his normal caffeine intake. On non-workout days patient will drink a couple of caffeinated beverages over the course of the day.     Assessment & Plan:   Active Problems:   History of CVA (cerebrovascular accident)   Chest pain   Prediabetes   History of DVT (deep vein thrombosis)   Non-ST elevation (NSTEMI) myocardial infarction [facility])   Chest pain:  -s/p cath: stent placed  Diabetes/Prediabetes:  -Patient stopped all diabetes medicines prior to admission due  to patient's A1c dropping below 6.5 and being told he can stop by his PCP. Glucose on admission 105. -recheck HgbA1c  History of CVA: No deficits and likely related to drug use in the past   - Monitor   History of DVT: pt stopped Xarelto 2 months prior to admission per his PCP recs. No sign of DVT or PE at this time. Ddimer nml and history given by Pt reassuring.   Obesity Body mass index is 38.78 kg/m.   DVT prophylaxis:  SQ Heparin  Code Status: Full Code   Family Communication:   Disposition Plan:     Consultants:   cards   Subjective: Sleeping soundly- no overnight issues  Objective: Vitals:   11/24/16 1127 11/24/16 1132 11/24/16 1137 11/24/16 1142  BP: 123/80 128/85 136/86   Pulse: 62 79 67 62  Resp: 14 (!) 23 20 20   Temp:      TempSrc:      SpO2: 98% 98% 98% 99%  Weight:      Height:        Intake/Output Summary (Last 24 hours) at 11/24/2016 1152 Last data filed at 11/24/2016 0700 Gross per 24 hour  Intake 422.95 ml  Output 500 ml  Net -77.05 ml   Filed Weights   11/23/16 2257 11/23/16 2321 11/24/16 0609  Weight: 132.9 kg (292 lb 14.4 oz) 132.9 kg (292 lb 14.4 oz) 133.3 kg (293 lb 14.4 oz)    Examination:  General exam: sleeping Respiratory system: Clear to auscultation. Respiratory effort normal. Cardiovascular system: S1 & S2 heard, RRR. No JVD, murmurs, rubs, gallops or  clicks. No pedal edema. Gastrointestinal system: +BS, obese Central nervous system: sleeping. Skin: No rashes, lesions or ulcers      Data Reviewed: I have personally reviewed following labs and imaging studies  CBC: Recent Labs  Lab 11/23/16 1226 11/24/16 0631  WBC 10.5 7.6  HGB 14.1 14.4  HCT 43.5 44.2  MCV 87.2 87.0  PLT 214 211   Basic Metabolic Panel: Recent Labs  Lab 11/23/16 1226 11/24/16 0631  NA 135 137  K 3.6 4.0  CL 105 108  CO2 22 22  GLUCOSE 108* 126*  BUN 13 15  CREATININE 0.99 0.94  CALCIUM 8.7* 8.8*   GFR: Estimated  Creatinine Clearance: 142.2 mL/min (by C-G formula based on SCr of 0.94 mg/dL). Liver Function Tests: No results for input(s): AST, ALT, ALKPHOS, BILITOT, PROT, ALBUMIN in the last 168 hours. No results for input(s): LIPASE, AMYLASE in the last 168 hours. No results for input(s): AMMONIA in the last 168 hours. Coagulation Profile: Recent Labs  Lab 11/23/16 1226  INR 1.05   Cardiac Enzymes: Recent Labs  Lab 11/23/16 1226 11/23/16 1617 11/23/16 2152  TROPONINI 0.06* 0.20* 0.45*   BNP (last 3 results) No results for input(s): PROBNP in the last 8760 hours. HbA1C: No results for input(s): HGBA1C in the last 72 hours. CBG: Recent Labs  Lab 11/24/16 0000 11/24/16 0803  GLUCAP 121* 99   Lipid Profile: No results for input(s): CHOL, HDL, LDLCALC, TRIG, CHOLHDL, LDLDIRECT in the last 72 hours. Thyroid Function Tests: No results for input(s): TSH, T4TOTAL, FREET4, T3FREE, THYROIDAB in the last 72 hours. Anemia Panel: No results for input(s): VITAMINB12, FOLATE, FERRITIN, TIBC, IRON, RETICCTPCT in the last 72 hours. Urine analysis:    Component Value Date/Time   COLORURINE YELLOW 05/31/2014 2331   APPEARANCEUR CLEAR 05/31/2014 2331   LABSPEC >1.046 (H) 05/31/2014 2331   PHURINE 5.5 05/31/2014 2331   GLUCOSEU 100 (A) 05/31/2014 2331   HGBUR TRACE (A) 05/31/2014 2331   BILIRUBINUR NEGATIVE 05/31/2014 2331   KETONESUR NEGATIVE 05/31/2014 2331   PROTEINUR 100 (A) 05/31/2014 2331   UROBILINOGEN 0.2 05/31/2014 2331   NITRITE NEGATIVE 05/31/2014 2331   LEUKOCYTESUR NEGATIVE 05/31/2014 2331     )No results found for this or any previous visit (from the past 240 hour(s)).    Anti-infectives (From admission, onward)   None       Radiology Studies: Dg Chest 2 View  Result Date: 11/23/2016 CLINICAL DATA:  Chest pain. EXAM: CHEST  2 VIEW COMPARISON:  06/01/2014 . FINDINGS: Mediastinum and hilar structures normal. Lungs are clear. No focal infiltrate. No pleural effusion or  pneumothorax. Heart size normal. IMPRESSION: No acute cardiopulmonary disease. Electronically Signed   By: Maisie Fus  Register   On: 11/23/2016 13:19        Scheduled Meds: . [MAR Hold] aspirin EC  325 mg Oral Daily  . [MAR Hold] Influenza vac split quadrivalent PF  0.5 mL Intramuscular Tomorrow-1000  . [MAR Hold] sodium chloride flush  3 mL Intravenous Q12H  . sodium chloride flush  3 mL Intravenous Q12H   Continuous Infusions: . [MAR Hold] sodium chloride    . sodium chloride    . sodium chloride 1 mL/kg/hr (11/24/16 0800)  . bivalirudin (ANGIOMAX) infusion 5 mg/mL Stopped (11/24/16 1140)  . heparin 1,600 Units/hr (11/24/16 0132)  . heparin    . tirofiban 0.15 mcg/kg/min (11/24/16 1141)     LOS: 0 days    Time spent: 35 min    Breuna Loveall U Peightyn Roberson, DO  [facility] Pager [phone]  If 7PM-7AM, please contact night-coverage [email or web address] Password [email or web address] 11/24/2016, 11:52 AM

## 2016-11-24 NOTE — Progress Notes (Signed)
Initial troponin in ED was 0.06 and increased to 0.2. Troponin 0.45 upon admission. Pt denies chest pain. MD paged. Orders received for Heparin per pharmacy consult. Will continue to monitor.

## 2016-11-25 LAB — CBC
HCT: 44.2 % (ref 39.0–52.0)
Hemoglobin: 13.9 g/dL (ref 13.0–17.0)
MCH: 27.4 pg (ref 26.0–34.0)
MCHC: 31.4 g/dL (ref 30.0–36.0)
MCV: 87.2 fL (ref 78.0–100.0)
PLATELETS: 189 10*3/uL (ref 150–400)
RBC: 5.07 MIL/uL (ref 4.22–5.81)
RDW: 15.9 % — AB (ref 11.5–15.5)
WBC: 8.7 10*3/uL (ref 4.0–10.5)

## 2016-11-25 LAB — BASIC METABOLIC PANEL
ANION GAP: 5 (ref 5–15)
BUN: 10 mg/dL (ref 6–20)
CALCIUM: 9.1 mg/dL (ref 8.9–10.3)
CO2: 25 mmol/L (ref 22–32)
Chloride: 109 mmol/L (ref 101–111)
Creatinine, Ser: 0.81 mg/dL (ref 0.61–1.24)
GFR calc non Af Amer: >60 mL/min (ref >60)
GLUCOSE: 99 mg/dL (ref 65–99)
Potassium: 4.3 mmol/L (ref 3.5–5.1)
SODIUM: 139 mmol/L (ref 135–145)

## 2016-11-25 LAB — GLUCOSE, CAPILLARY: Glucose-Capillary: 111 mg/dL — ABNORMAL HIGH (ref 65–99)

## 2016-11-25 MED ORDER — ASPIRIN 81 MG PO TBEC: 81.0000 mg | DELAYED_RELEASE_TABLET | Freq: Every day | ORAL | 0 refills | Status: DC

## 2016-11-25 MED ORDER — CLOPIDOGREL BISULFATE 75 MG PO TABS
150.0000 mg | ORAL_TABLET | Freq: Once | ORAL | Status: AC
  Administered 2016-11-25: 150 mg via ORAL
  Filled 2016-11-25: qty 2

## 2016-11-25 MED ORDER — RIVAROXABAN 15 MG PO TABS: 15.0000 mg | ORAL_TABLET | Freq: Two times a day (BID) | ORAL | Status: DC

## 2016-11-25 MED ORDER — RIVAROXABAN (XARELTO) VTE STARTER PACK (15 & 20 MG): ORAL_TABLET | ORAL | 0 refills | Status: DC

## 2016-11-25 MED ORDER — RIVAROXABAN 20 MG PO TABS: 20.0000 mg | ORAL_TABLET | Freq: Every day | ORAL | Status: DC

## 2016-11-25 MED ORDER — CLOPIDOGREL BISULFATE 75 MG PO TABS: 75.0000 mg | ORAL_TABLET | Freq: Every day | ORAL | 0 refills | Status: DC

## 2016-11-25 MED ORDER — TICAGRELOR 90 MG PO TABS
90.0000 mg | ORAL_TABLET | Freq: Two times a day (BID) | ORAL | Status: AC
  Administered 2016-11-25: 90 mg via ORAL

## 2016-11-25 MED ORDER — CLOPIDOGREL BISULFATE 75 MG PO TABS: 75.0000 mg | ORAL_TABLET | Freq: Every day | ORAL | Status: DC

## 2016-11-25 MED ORDER — ATORVASTATIN CALCIUM 80 MG PO TABS: 80.0000 mg | ORAL_TABLET | Freq: Every day | ORAL | 0 refills | Status: DC

## 2016-11-25 MED FILL — Heparin Sodium (Porcine) Inj 1000 Unit/ML: INTRAMUSCULAR | Qty: 10 | Status: AC

## 2016-11-25 MED FILL — Lidocaine HCl Local Preservative Free (PF) Inj 1%: INTRAMUSCULAR | Qty: 30 | Status: AC

## 2016-11-25 NOTE — Progress Notes (Signed)
#   5. S/W LATRETT @ [facility] # [phone]    1. XARELTO 15 MG BID  COVER- YES  CO-PAY- $ 35.00  PRIOR APPROVAL- NO   2. XARELTO  20 MG DAILY  COVER- YES  CO-PAY- $ 35.00  PRIOR APPROVAL- NO   PREFERRED PHARMACY : [facility]

## 2016-11-25 NOTE — Progress Notes (Signed)
CARDIAC REHAB PHASE I   PRE:  Rate/Rhythm: 79 SR  BP:  Sitting: 112/37      SaO2: 97%  MODE:  Ambulation: 1,500 ft   POST:  Rate/Rhythm: 93 SR  BP:  Sitting: 114/37      SaO2: 98%  Pt ambulated 1,500 ft with steady gait. Pt denied complaints of CP or SOB. Pt returned to recliner. Call bell within reach. Ed completed with pt. Reviewed MI booklet, stent card, restrictions, anti-platelet therapy, NTG, heart healthy/diabetic diet, exercise guidelines, and CRPII. Pt verbalized understanding of education. Pt is currently participating in crossfit and is not very interested in CRPII, but okay with [place] CRPII referral being sent.   [phone]  York Cerise MS, ACSM CEP  9:32 AM 11/25/2016

## 2016-11-26 ENCOUNTER — Telehealth (HOSPITAL_COMMUNITY): Payer: Self-pay

## 2016-11-26 NOTE — Telephone Encounter (Signed)
Patients insurance is active and benefits verified through [employer] - $30.00 co-pay, no deductible, out of pocket amount of $4,500/$1,677.01 has been met, no co-insurance, and no pre-authorization is required. Passport/reference [phone]  Patient will be contacted and scheduled after their follow up visit with the Cardiologist office upon review by the RN Navigator.

## 2016-12-03 NOTE — Discharge Summary (Addendum)
Physician Discharge Summary  [name] Holland ZOX:[id number] DOB: May 14, 1970 DOA: 11/23/2016  PCP: Willow Ora, MD  Admit date: 11/23/2016 Discharge date: 12/03/2016   Recommendations for Outpatient Follow-Up:   Plavix, aspirin, and Xarelto for 1 month then drop aspirin.  Plavix and Xarelto would then be continued for at least 6 months and preferably 12 months at which time the Plavix can be discontinued but will discuss in detail with the patient.  Xarelto should be lifelong.    Discharge Diagnosis:   Active Problems:   History of CVA (cerebrovascular accident)   Chest pain   Prediabetes   History of DVT (deep vein thrombosis)   Non-ST elevation (NSTEMI) myocardial infarction [facility])   Discharge disposition:  Home.   Discharge Condition: Improved.  Diet recommendation: Low sodium, heart healthy.  Carbohydrate-modified  Wound care: None.   History of Present Illness:   [name] Holland is a 46 y.o. male with medical history significant of diabetes, DVT, CVA.  Patient reports acute onset chest pain. Substernal radiation to the left arm and with mild associated nausea. Initially described as dull achy pressure. Came on at approximately 09:30 immediately after his cross fit workout. Patient endorses his normal 15 minute cross fit workout. Patient drove himself home and took a shower at which point the pain started to change and radiated all the way down to his left wrist and jaw. EMS was called and patient was given 3 nitroglycerin and an aspirin with significant improvement in his symptoms. Denies any orthopnea, lower extremity edema, shortness of breath, dictations, fevers, cough, neck stiffness, headache, focal neurological deficits. Patient reports previous to being evaluated by cardiology and being asked to have a stress test for which patient did not schedule. During episode pain was described as constant and unchanged with certain body movements or respirations. Patient  has had pain like this in the distant past when using illegal drugs but denies any use of sedative drugs at this time. Patient uses smokeless tobacco and denies any alcohol use. Patient does use 400 mg of caffeine prior to every cross fit workout which is unchanged from his normal caffeine intake. On non-workout days patient will drink a couple of caffeinated beverages over the course of the day.     Hospital Course by Problem:    Non-ST elevation myocardial infarction -cath: 1. Severe filling defect in the proximal LAD that appeared to be a thrombus at the site of a likely ruptured plaque.  2. No disease in the RCA or Circumflex.  3. Successful PTCA/DES x 1 proximal LAD 4. Normal LV systolic function 5. Thrombus formation in the LAD that is likely related to an underlying hypercoagulable disorder given his history of prior DVT and intracranial venous thrombus.  PLAN: Plavix, aspirin, and Xarelto for 1 month then drop aspirin.  Plavix and Xarelto would then be continued for at least 6 months and preferably 12 months at which time the Plavix can be discontinued but will discuss in detail with the patient.  Xarelto should be lifelong.    Pre DM -HgbA1C 5.7  History of clotting: DVT and history of coronary sinus thrombosis.  Will need long-term anticoagulation therapy-- see above for recommendations      Medical Consultants:    cards.   Discharge Exam:   Vitals:   11/25/16 0700 11/25/16 0900  BP: (!) 123/54 (!) 114/37  Pulse: 68   Resp: (!) 23 (!) 5  Temp: 97.9 F (36.6 C)   SpO2: 96%  Vitals:   11/24/16 2000 11/25/16 0454 11/25/16 0700 11/25/16 0900  BP:  (!) 115/56 (!) 123/54 (!) 114/37  Pulse:  (!) 57 68   Resp: 14 17 (!) 23 (!) 5  Temp:  97.9 F (36.6 C) 97.9 F (36.6 C)   TempSrc:  Oral Oral   SpO2:  96% 96%   Weight:  135.4 kg (298 lb 8.1 oz)    Height:        Gen:  NAD    The results of significant diagnostics from this hospitalization (including  imaging, microbiology, ancillary and laboratory) are listed below for reference.     Procedures and Diagnostic Studies:   Dg Chest 2 View  Result Date: 11/23/2016 CLINICAL DATA:  Chest pain. EXAM: CHEST  2 VIEW COMPARISON:  06/01/2014 . FINDINGS: Mediastinum and hilar structures normal. Lungs are clear. No focal infiltrate. No pleural effusion or pneumothorax. Heart size normal. IMPRESSION: No acute cardiopulmonary disease. Electronically Signed   By: Maisie Fus  Register   On: 11/23/2016 13:19     Labs:   Basic Metabolic Panel: No results for input(s): NA, K, CL, CO2, GLUCOSE, BUN, CREATININE, CALCIUM, MG, PHOS in the last 168 hours. GFR Estimated Creatinine Clearance: 166.3 mL/min (by C-G formula based on SCr of 0.81 mg/dL). Liver Function Tests: No results for input(s): AST, ALT, ALKPHOS, BILITOT, PROT, ALBUMIN in the last 168 hours. No results for input(s): LIPASE, AMYLASE in the last 168 hours. No results for input(s): AMMONIA in the last 168 hours. Coagulation profile No results for input(s): INR, PROTIME in the last 168 hours.  CBC: No results for input(s): WBC, NEUTROABS, HGB, HCT, MCV, PLT in the last 168 hours. Cardiac Enzymes: No results for input(s): CKTOTAL, CKMB, CKMBINDEX, TROPONINI in the last 168 hours. BNP: Invalid input(s): POCBNP CBG: No results for input(s): GLUCAP in the last 168 hours. D-Dimer No results for input(s): DDIMER in the last 72 hours. Hgb A1c No results for input(s): HGBA1C in the last 72 hours. Lipid Profile No results for input(s): CHOL, HDL, LDLCALC, TRIG, CHOLHDL, LDLDIRECT in the last 72 hours. Thyroid function studies No results for input(s): TSH, T4TOTAL, T3FREE, THYROIDAB in the last 72 hours.  Invalid input(s): FREET3 Anemia work up No results for input(s): VITAMINB12, FOLATE, FERRITIN, TIBC, IRON, RETICCTPCT in the last 72 hours. Microbiology No results found for this or any previous visit (from the past 240  hour(s)).   Discharge Instructions:   Discharge Instructions    Amb Referral to Cardiac Rehabilitation   Complete by:  As directed    Diagnosis:   NSTEMI Coronary Stents     Diet - low sodium heart healthy   Complete by:  As directed    Discharge instructions   Complete by:  As directed    Plan for medications: Plavix, aspirin, and Xarelto for 1 month then stop aspirin.  Plavix and Xarelto would then be continued for at least 6 months and preferably 12 months at which time the Plavix could be discontinued but will need discussion with cardiologist before that happens.  Xarelto will be lifelong.   Increase activity slowly   Complete by:  As directed      Allergies as of 11/25/2016   No Known Allergies     Medication List    STOP taking these medications   ezetimibe 10 MG tablet Commonly known as:  ZETIA   ibuprofen 200 MG tablet Commonly known as:  ADVIL,MOTRIN     TAKE these medications   aspirin 81  MG EC tablet Take 1 tablet (81 mg total) daily by mouth. Notes to patient:  Prevents clotting in stent to prevent heart attack   atorvastatin 80 MG tablet Commonly known as:  LIPITOR Take 1 tablet (80 mg total) daily at 6 PM by mouth. Notes to patient:  Cholesterol    clopidogrel 75 MG tablet Commonly known as:  PLAVIX Take 1 tablet (75 mg total) daily by mouth. Notes to patient:  Prevents clotting in the stent and heart attack   Rivaroxaban 15 & 20 MG Tbpk Take as directed on package: Start with one 15mg  tablet by mouth twice a day with food. On Day 22, switch to one 20mg  tablet once a day with food. Notes to patient:  Prevents clotting and deep vein thrombosis      Follow-up Information    Rosalio MacadamiaGerhardt, Lori C, NP Follow up on 12/06/2016.   Specialties:  Nurse Practitioner, Interventional Cardiology, Cardiology, Radiology Why:  at 10am for your follow up appt.  Contact information: [address] [place] [id number] [phone]             Time coordinating discharge: 35 min  Signed:  Joseph ArtJessica U Vann   [facility] 12/03/2016, 7:46 AM

## 2016-12-06 ENCOUNTER — Ambulatory Visit: Payer: BLUE CROSS/BLUE SHIELD | Admitting: Nurse Practitioner

## 2016-12-06 ENCOUNTER — Encounter (INDEPENDENT_AMBULATORY_CARE_PROVIDER_SITE_OTHER): Payer: Self-pay

## 2016-12-06 NOTE — Progress Notes (Deleted)
CARDIOLOGY OFFICE NOTE  Date:  12/06/2016    [name] Holland Date of Birth: 1971/01/11 Medical Record #[id number]  PCP:  Willow Ora, MD  Cardiologist:  Kyra Manges    No chief complaint on file.   History of Present Illness: [name] Holland is a 46 y.o. male who presents today for a post hospital visit. Seen for Dr. Katrinka Blazing ([place]).  He has a history of diabetes, cavernous sinus thrombosis, recurrent DVT, & CVA. He has been on Xarelto in the past - noted that he has stopped this on his own. Apparently had negative hypercoagulable workup in the past. Had history of ischemic event back in 2009 associated with cocaine use. RCA vasospasm reversed with intracoronary NTG by Dr. [name]. He has been non compliant with prior medical regimen. More recently, has been able to stop smoking. Has lost weight and is off diabetic medicines.   Presented earlier this month with acute onset of chest pain while working out. EMS called. Troponin mildly elevated. Cardiac catheterization was recommended. See below.   Comes in today. Here with   Past Medical History:  Diagnosis Date  . Arthritis    "knees" (11/23/2016)  . DVT (deep venous thrombosis) (HCC) 05/2016   LLE  . Myocardial infarction (HCC) 2016   "caused by blood clot in the left side of my head"  . Stroke (HCC) 05/2014   denies residual on 11/23/2016  . Type II diabetes mellitus (HCC)     Past Surgical History:  Procedure Laterality Date  . CARDIAC CATHETERIZATION  2008  . CORONARY STENT INTERVENTION N/A 11/24/2016   Procedure: CORONARY STENT INTERVENTION;  Surgeon: Kathleene Hazel, MD;  Location: [facility];  Service: Cardiovascular;  Laterality: N/A;  . CORONARY THROMBECTOMY N/A 11/24/2016   Procedure: Coronary Thrombectomy;  Surgeon: Kathleene Hazel, MD;  Location: [facility];  Service: Cardiovascular;  Laterality: N/A;  . KNEE ARTHROSCOPY Bilateral early 2000s  . LEFT HEART CATH AND  CORONARY ANGIOGRAPHY N/A 11/24/2016   Procedure: LEFT HEART CATH AND CORONARY ANGIOGRAPHY;  Surgeon: Kathleene Hazel, MD;  Location: [facility];  Service: Cardiovascular;  Laterality: N/A;     Medications: No outpatient medications have been marked as taking for the 12/06/16 encounter (Appointment) with [name] Macadamia, NP.     Allergies: No Known Allergies  Social History: The patient  reports that he quit smoking about 4 years ago. His smoking use included cigarettes. He has a 24.00 pack-year smoking history. He uses smokeless tobacco. He reports that he uses drugs. Drug: Cocaine. He reports that he does not drink alcohol.   Family History: The patient's family history includes CAD in his father; Diabetes type II in his mother; Other in his mother.  His maternal uncle has an LVAD in place. Mom has angina but no apparent blockages. Father had CABG in his 67's.   Review of Systems: Please see the history of present illness.   Otherwise, the review of systems is positive for none.   All other systems are reviewed and negative.   Physical Exam: VS:  There were no vitals taken for this visit. Marland Kitchen  BMI There is no height or weight on file to calculate BMI.  Wt Readings from Last 3 Encounters:  11/25/16 298 lb 8.1 oz (135.4 kg)  08/31/16 297 lb 3.2 oz (134.8 kg)  06/01/16 (!) 345 lb (156.5 kg)    General: Pleasant. Well developed, well nourished and in  no acute distress.   HEENT: Normal.  Neck: Supple, no JVD, carotid bruits, or masses noted.  Cardiac: Regular rate and rhythm. No murmurs, rubs, or gallops. No edema.  Respiratory:  Lungs are clear to auscultation bilaterally with normal work of breathing.  GI: Soft and nontender.  MS: No deformity or atrophy. Gait and ROM intact.  Skin: Warm and dry. Color is normal.  Neuro:  Strength and sensation are intact and no gross focal deficits noted.  Psych: Alert, appropriate and with normal affect.   LABORATORY  DATA:  EKG:  EKG is not ordered today.  Lab Results  Component Value Date   WBC 8.7 11/25/2016   HGB 13.9 11/25/2016   HCT 44.2 11/25/2016   PLT 189 11/25/2016   GLUCOSE 99 11/25/2016   CHOL 197 06/02/2016   TRIG 157 (H) 06/02/2016   HDL 28 (L) 06/02/2016   LDLCALC 138 (H) 06/02/2016   ALT 34 06/02/2016   AST 20 06/02/2016   NA 139 11/25/2016   K 4.3 11/25/2016   CL 109 11/25/2016   CREATININE 0.81 11/25/2016   BUN 10 11/25/2016   CO2 25 11/25/2016   TSH 6.605 Test methodology is 3rd generation TSH (H) 11/09/2007   INR 1.05 11/23/2016   HGBA1C 5.7 (H) 11/24/2016     BNP (last 3 results) No results for input(s): BNP in the last 8760 hours.  ProBNP (last 3 results) No results for input(s): PROBNP in the last 8760 hours.   Other Studies Reviewed Today:  Echo Study Conclusions 11/2016  - Left ventricle: The cavity size was normal. Wall thickness was   normal. Systolic function was normal. The estimated ejection   fraction was in the range of 60% to 65%. Wall motion was normal;   there were no regional wall motion abnormalities. - Aortic valve: Valve area (VTI): 2.92 cm^2. Valve area (Vmax):   3.13 cm^2. Valve area (Vmean): 2.84 cm^2. - Left atrium: The atrium was mildly dilated. - Right atrium: The atrium was mildly dilated.    Coronary Thrombectomy 11/2016  CORONARY STENT INTERVENTION  LEFT HEART CATH AND CORONARY ANGIOGRAPHY  Conclusion     Ost LAD to Prox LAD lesion is 90% stenosed.  Post intervention, there is a 0% residual stenosis.  A drug-eluting stent was successfully placed using a STENT RESOLUTE ONYX 4.5X15.  The left ventricular systolic function is normal.  LV end diastolic pressure is normal.  The left ventricular ejection fraction is 55-65% by visual estimate.  There is no mitral valve regurgitation.   1. Severe filling defect in the proximal LAD that appeared to be a thrombus at the site of a likely ruptured plaque.  2. No disease  in the RCA or Circumflex.  3. Successful PTCA/DES x 1 proximal LAD 4. Normal LV systolic function 5. Thrombus formation in the LAD that is likely related to an underlying hypercoagulable disorder given his history of prior DVT and intracranial venous thrombus.   Recommendations: I will continue Aggrastat for 18 hours given the thrombus burden. He was loaded with Brilinta in the cath lab but after discussion with the patient's mother following the cath, it appears that he may have a hypercoagulable disorder. He may be best managed with Xarelto and Plavix post discharge for a year along with ASA for one month. Will start high intensity statin. Beta blocker as tolerated.      Assessment/Plan:  Non-ST elevation myocardial infarction -cath: 1. Severe filling defect in the proximal LAD that appeared to be  a thrombus at the site of a likely ruptured plaque.  2. No disease in the RCA or Circumflex.  3. Successful PTCA/DES x 1 proximal LAD 4. Normal LV systolic function 5. Thrombus formation in the LAD that is likely related to an underlying hypercoagulable disorder given his history of prior DVT and intracranial venous thrombus.  PLAN: Plavix, aspirin, and Xarelto for 1 month then drop aspirin. Plavix and Xarelto would then be continued for at least 6 months and preferably 12 months at which time the Plavix can be discontinued but will discuss in detail with the patient. Xarelto should be lifelong.    Pre DM -HgbA1C 5.7  History of clotting: DVT and history of coronary sinus thrombosis. Will need long-term anticoagulation therapy-- see above for recommendations     Current medicines are reviewed with the patient today.  The patient does not have concerns regarding medicines other than what has been noted above.  The following changes have been made:  See above.  Labs/ tests ordered today include:   No orders of the defined types were placed in this encounter.    Disposition:    FU with *** in {gen number [id number]} {Days to years:10300}.   Patient is agreeable to this plan and will call if any problems develop in the interim.   SignedNorma Fredrickson: Havah Ammon, NP  12/06/2016 10:00 AM  [facility] [address] [place], [place]  [address] Phone: [phone] Fax: [phone]

## 2016-12-07 ENCOUNTER — Ambulatory Visit: Payer: BLUE CROSS/BLUE SHIELD | Admitting: Cardiovascular Disease

## 2016-12-07 ENCOUNTER — Encounter: Payer: Self-pay | Admitting: Cardiovascular Disease

## 2016-12-07 VITALS — BP 132/76 | HR 87 | Ht 73.0 in | Wt 297.0 lb

## 2016-12-07 DIAGNOSIS — I214 Non-ST elevation (NSTEMI) myocardial infarction: Secondary | ICD-10-CM | POA: Diagnosis not present

## 2016-12-07 DIAGNOSIS — E785 Hyperlipidemia, unspecified: Secondary | ICD-10-CM

## 2016-12-07 NOTE — Progress Notes (Signed)
12/07/2016 [name] Holland   08/16/1970  [id number]  Primary Physician [name] Ora, MD Primary Cardiologist: [name] Gess MD [name] Holland, [place]  HPI:  [name] Holland is a 46 y.o. male married, father of 2 sons who is married to [name] Holland. He and his wife both workout at my gym. He has a history of hyperlipidemia and diet-controlled diabetes. He apparently had coronary vasospasm back in 2009 related to cocaine abuse and underwent cardiac catheterization at that time by Dr. Elsie Holland .he has been alcohol and drug free for several years. 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 He presented on 11/24/16 with unstable angina and had a "non-STEMI". He had a cardiac catheterization by Dr. Clifton Holland  Performed family revealing a thrombotic high-grade proximal LAD stenosis which was stented with a 4.5 mm x 15 mm long resolute on the drug-eluting stent after undergoing aspiration thrombectomy. He is also placed on Aggrastat. He had normal LV function. He's had no recurrent symptoms. He is on Xarelto for DVT in the past and currently is taking "triple therapy".   Current Meds  Medication Sig  . aspirin EC 81 MG EC tablet Take 1 tablet (81 mg total) daily by mouth.  Marland Kitchen atorvastatin (LIPITOR) 80 MG tablet Take 1 tablet (80 mg total) daily at 6 PM by mouth.  . clopidogrel (PLAVIX) 75 MG tablet Take 1 tablet (75 mg total) daily by mouth.  . Rivaroxaban 15 & 20 MG TBPK Take as directed on package: Start with one 15mg  tablet by mouth twice a day with food. On Day 22, switch to one 20mg  tablet once a day with food.     No Known Allergies  Social History   Socioeconomic History  . Marital status: Legally Separated    Spouse name: Not on file  . Number of children: Not on file  . Years of education: Not on file  . Highest education level: Not on file  Social Needs  . Financial resource strain: Not on file  . Food insecurity - worry: Not on file  . Food insecurity - inability: Not on file    . Transportation needs - medical: Not on file  . Transportation needs - non-medical: Not on file  Occupational History  . Not on file  Tobacco Use  . Smoking status: Former Smoker    Packs/day: 1.00    Years: 24.00    Pack years: 24.00    Types: Cigarettes    Last attempt to quit: 2014    Years since quitting: 4.9  . Smokeless tobacco: Current User  Substance and Sexual Activity  . Alcohol use: No  . Drug use: Yes    Types: Cocaine    Comment: 11/23/2016 "tx for cocaine abuse in 2014"  . Sexual activity: Yes  Other Topics Concern  . Not on file  Social History Narrative  . Not on file     Review of Systems: General: negative for chills, fever, night sweats or weight changes.  Cardiovascular: negative for chest pain, dyspnea on exertion, edema, orthopnea, palpitations, paroxysmal nocturnal dyspnea or shortness of breath Dermatological: negative for rash Respiratory: negative for cough or wheezing Urologic: negative for hematuria Abdominal: negative for nausea, vomiting, diarrhea, bright red blood per rectum, melena, or hematemesis Neurologic: negative for visual changes, syncope, or dizziness All other systems reviewed and are otherwise negative except as noted above.    Blood pressure 132/76, pulse 87, height 6\' 1"  (1.854 m), weight 297 lb (  134.7 kg).  General appearance: alert and no distress Neck: no adenopathy, no carotid bruit, no JVD, supple, symmetrical, trachea midline and thyroid not enlarged, symmetric, no tenderness/mass/nodules Lungs: clear to auscultation bilaterally Heart: regular rate and rhythm, S1, S2 normal, no murmur, click, rub or gallop Extremities: extremities normal, atraumatic, no cyanosis or edema Pulses: 2+ and symmetric Skin: Skin color, texture, turgor normal. No rashes or lesions Neurologic: Alert and oriented X 3, normal strength and tone. Normal symmetric reflexes. Normal coordination and gait  EKG not performed today  ASSESSMENT AND  PLAN:   HLD (hyperlipidemia) History of hyperlipidemia on high-dose statin therapy. 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  Non-ST elevation (NSTEMI) myocardial infarction [facility](HCC) History of CAD status post recent proximal LAD PCI and drug-eluting sten using a 4.5 mm x 15 mm long resolute onyx DES. He had a large thrombus burden and underwent aspiration thrombectomy as well and was put on Aggrastat. The procedure was done femorally after he was unable to pass a catheter into the aortic arch because of an aberrant takeoff of the innominate artery. He has been pain-free. He works out vigorously trainers that told him that he can go back to his normal workouts after one month. I'm going to keep him on "triple therapy for 4 weeks and then discontinue the aspirin after that.      [name] GessJonathan J. Blase Beckner MD FACP,FACC,FAHA, [facility] 12/07/2016 12:44 PM

## 2016-12-07 NOTE — Assessment & Plan Note (Signed)
History of CAD status post recent proximal LAD PCI and drug-eluting sten using a 4.5 mm x 15 mm long resolute onyx DES. He had a large thrombus burden and underwent aspiration thrombectomy as well and was put on Aggrastat. The procedure was done femorally after he was unable to pass a catheter into the aortic arch because of an aberrant takeoff of the innominate artery. He has been pain-free. He works out vigorously trainers that told him that he can go back to his normal workouts after one month. I'm going to keep him on "triple therapy for 4 weeks and then discontinue the aspirin after that.

## 2016-12-07 NOTE — Patient Instructions (Signed)
Medication Instructions: Your physician recommends that you continue on your current medications as directed. Please refer to the Current Medication list given to you today.  STOP Aspirin in mid-December.   Continue Clopidogrel (Plavix) and Rivaroxaban (Xarelto).    Labwork: Your physician recommends that you return for a FASTING lipid profile and hepatic function panel at your earliest convenience. Do not eat or drink anything except water after midnight the night before.   Follow-Up: Your physician wants you to follow-up in: 6 months with Dr. Allyson Sabal. You will receive a reminder letter in the mail two months in advance. If you don't receive a letter, please call our office to schedule the follow-up appointment.  If you need a refill on your cardiac medications before your next appointment, please call your pharmacy.

## 2016-12-07 NOTE — Assessment & Plan Note (Signed)
History of hyperlipidemia on high-dose statin therapy.  We will recheck a lipid liver profile. 

## 2016-12-07 NOTE — Assessment & Plan Note (Signed)
History of DVT on Xarelto oral anticoagulation.

## 2016-12-08 ENCOUNTER — Ambulatory Visit (INDEPENDENT_AMBULATORY_CARE_PROVIDER_SITE_OTHER): Payer: BLUE CROSS/BLUE SHIELD | Admitting: Family Medicine

## 2016-12-08 ENCOUNTER — Telehealth (HOSPITAL_COMMUNITY): Payer: Self-pay

## 2016-12-08 ENCOUNTER — Encounter: Payer: Self-pay | Admitting: Family Medicine

## 2016-12-08 VITALS — BP 124/80 | HR 103 | Temp 98.0°F | Ht 73.0 in | Wt 295.0 lb

## 2016-12-08 DIAGNOSIS — E782 Mixed hyperlipidemia: Secondary | ICD-10-CM

## 2016-12-08 DIAGNOSIS — I2510 Atherosclerotic heart disease of native coronary artery without angina pectoris: Secondary | ICD-10-CM

## 2016-12-08 DIAGNOSIS — Z716 Tobacco abuse counseling: Secondary | ICD-10-CM

## 2016-12-08 DIAGNOSIS — E118 Type 2 diabetes mellitus with unspecified complications: Secondary | ICD-10-CM

## 2016-12-08 DIAGNOSIS — E669 Obesity, unspecified: Secondary | ICD-10-CM | POA: Diagnosis not present

## 2016-12-08 DIAGNOSIS — I2583 Coronary atherosclerosis due to lipid rich plaque: Secondary | ICD-10-CM

## 2016-12-08 DIAGNOSIS — Z86718 Personal history of other venous thrombosis and embolism: Secondary | ICD-10-CM | POA: Diagnosis not present

## 2016-12-08 DIAGNOSIS — F17220 Nicotine dependence, chewing tobacco, uncomplicated: Secondary | ICD-10-CM

## 2016-12-08 LAB — MICROALBUMIN / CREATININE URINE RATIO
CREATININE, U: 166.5 mg/dL
MICROALB UR: 31.2 mg/dL — AB (ref 0.0–1.9)
Microalb Creat Ratio: 18.7 mg/g (ref 0.0–30.0)

## 2016-12-08 NOTE — Patient Instructions (Signed)
It was so good seeing you again! Thank you for establishing with my new practice and allowing me to continue caring for you. It means a lot to me.   Please return in 22months for diabetes follow up.

## 2016-12-08 NOTE — Progress Notes (Signed)
Subjective  CC:  Chief Complaint  Patient presents with  . Establish Care    Patient is here today to establish care. Since last seen he has had a DVT in his left thigh which he is taking blood thinners for.  Also had a stent placed in LAD on 11.14.2018.  . Diabetes    He is also here today to F/U with DM2.    HPI: [name] Holland is a 46 y.o. male who presents to [facility] today to establish care with me as a new patient.  He has the following concerns or needs:   Very recent Nstemi MI s/p cath and LAD stenting. Saw Dr. Gwenlyn Found yesterday. I have reviewed all recent hospital notes, labs, and OV. Due to thrombus visualized on imaging, he is on asa, xarelto and plavix. Cards will stop asa after 2 weeks and then continue combo anticoagulants x 6 months. Then lifelong xarelto. Has had recurrent venous thrombosis without etiology / disorder identified by heme. Feeling well w/o cp, sob, or sxs of CHF.   Hyperlipidemia: to have f/u labs with cards on high dose statin. Need to push ldl < 70. Tolerating statin. Recent nl lfts  DM - recent a1c was 5.7; no longer on metformin; has lost 60 pounds with diet (clean eating) and daily training at [employer]. Feels good. Now back on track to keep chronic conditions controlled. No foot neuropathy sxs. Eye exam is up to date w/o retinopathy. imms are up to date. Nl renal fxn. Due urine MAC ratio; not on ACE. Has tolerated in past. Bp is normal  H/o DVT several months ago - no swelling in leg or pain now.   Chewing tobacco: chews 2 cans/ day! Ready to quit. Has many questions.   Obesity: much improved. BMI now less than 40. Has good knowledge about meal prepping and lean proteins/veggie/fiber diet.   We updated and reviewed the patient's past history in detail and it is documented below.  Problem  Coronary Artery Disease Due to Lipid Rich Plaque  Non-St Elevation (Nstemi) Myocardial Infarction (Hcc)   Non-STEMI   History of Dvt (Deep Vein Thrombosis)  History of Cerebral Venous Sinus Thrombosis  Long Term Current Use of Anticoagulant  Mixed Hyperlipidemia   hyperlipidemia   Obesity (Bmi 30-39.9)  Cva (Cerebral Infarction)  Controlled Type 2 Diabetes Mellitus With Neurologic Complication, Without Long-Term Current Use of Insulin (Hcc)   Overview:  H/o intracranial venous thrombus treated with xarelto x 3 months: neg MRI for CVA. Nl dopplers. Nl echo, mild diastolic dysfunction   History of Cerebral Venous Infarction Associated With Cerebral Sinovenous Thrombosis  Bilateral Primary Osteoarthritis of Knee   Overview:  S/p bilateral arthroscopy   Cocaine Abuse in Remission (Hcc)  Chest Pain (Resolved)  Prediabetes (Resolved)  Noncompliance With Medications (Resolved)  Acute Deep Vein Thrombosis (Dvt) of Femoral Vein (Hcc) (Resolved)   dVT   Left Facial Numbness (Resolved)  Labral Tear of Right Hip Joint (Resolved)  Thrombus (Resolved)  Coital Headache (Resolved)  Intracranial and Intraspinal Phlebitis and Thrombophlebitis (Resolved)   Health Maintenance  Topic Date Due  . URINE MICROALBUMIN  05/28/1981  . OPHTHALMOLOGY EXAM  04/11/2017  . HEMOGLOBIN A1C  05/24/2017  . FOOT EXAM  12/08/2017  . PNEUMOCOCCAL POLYSACCHARIDE VACCINE (2) 06/02/2019  . TETANUS/TDAP  04/20/2025  . INFLUENZA VACCINE  Completed  . HIV Screening  Completed   Immunization History  Administered Date(s) Administered  . Influenza,inj,quad, With Preservative  11/29/2016  . Influenza-Unspecified 02/26/2016  . Pneumococcal Polysaccharide-23 06/02/2014  . Tdap 04/21/2015   Current Meds  Medication Sig  . aspirin EC 81 MG EC tablet Take 1 tablet (81 mg total) daily by mouth.  Marland Kitchen atorvastatin (LIPITOR) 80 MG tablet Take 1 tablet (80 mg total) daily at 6 PM by mouth.  . clopidogrel (PLAVIX) 75 MG tablet Take 1 tablet (75 mg total) daily by mouth.  . Rivaroxaban 15 & 20 MG TBPK Take as directed on package: Start  with one 61m tablet by mouth twice a day with food. On Day 22, switch to one 218mtablet once a day with food.    Allergies: Patient  reports that he does not drink alcohol. Past Medical History Patient  has a past medical history of Acute deep vein thrombosis (DVT) of femoral vein (HCLeb[place] Arthritis, Clotting disorder (HCLip[place] Cocaine abuse in remission (HC[place](2014), Coronary artery disease due to lipid rich plaque, DVT (deep venous thrombosis) (HCD[place](05/2016), 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 (05/31/2014), Hyperlipidemia, Intracranial and intraspinal phlebitis and thrombophlebitis (05/31/2014), Myocardial infarction (HCK[place](2016), Stroke (HCIndian Mountain [place](05/2014), and Type II diabetes mellitus (HCEarl[place] Past Surgical History Patient  has a past surgical history that includes Knee arthroscopy (Bilateral, early 2000s); Cardiac catheterization (2008); LEFT HEART CATH AND CORONARY ANGIOGRAPHY (N/A, 11/24/2016); CORONARY STENT INTERVENTION (N/A, 11/24/2016); Coronary Thrombectomy (N/A, 11/24/2016); Wrist surgery (Right); and Hip surgery (Right). Social History   Socioeconomic History  . Marital status: Married    Spouse name: Yadi  . Number of children: 2  . Years of education: None  . Highest education level: None  Social Needs  . Financial resource strain: None  . Food insecurity - worry: None  . Food insecurity - inability: None  . Transportation needs - medical: None  . Transportation needs - non-medical: None  Occupational History  . 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 Smoking status: Former Smoker    Packs/day: 1.00    Years: 24.00    Pack years: 24.00    Types: Cigarettes    Last attempt to quit: 2014    Years since quitting: 4.9  . Smokeless tobacco: Current User    Types: Chew  Substance and Sexual Activity  . Alcohol use: No  . Drug use: No    Comment: 11/23/2016  "tx for cocaine abuse in 2014"  . Sexual activity: Yes  Other Topics Concern  . None  Social History Narrative   Married, 2 sons, substance abuse reProduction designer, theatre/television/filmsober from cocaine since 2014, exercises -weights, crossfit, trainer, healthy diet mostly (will intermittently eat poorly and regain weight), former smoker, chews tobacco,    Family History  Problem Relation Age of Onset  . Other Mother   . Diabetes type II Mother   . Asthma Mother   . Diabetes Mother   . CAD Father        CABG  . Hypertension Father   . Hyperlipidemia Father   . Pancreatic cancer Maternal Aunt   . Lung cancer Maternal Aunt   . CAD Maternal Grandmother   . Cerebral aneurysm Paternal Grandmother   . Cancer Paternal Grandfather        unknown    Review of Systems: Constitutional: negative for fever or malaise Ophthalmic: negative for photophobia, double vision or loss of vision Cardiovascular: negative for chest pain, dyspnea on exertion, or new LE swelling  Respiratory: negative for SOB or persistent cough Gastrointestinal: negative for abdominal pain, change in bowel habits or melena Genitourinary: negative for dysuria or gross hematuria Musculoskeletal: negative for new gait disturbance or muscular weakness Integumentary: negative for new or persistent rashes Neurological: negative for TIA or stroke symptoms Psychiatric: negative for SI or delusions Allergic/Immunologic: negative for hives  Patient Care Team    Relationship Specialty Notifications Start End  Leamon Arnt, MD PCP - General Family Medicine  06/02/16   Belva Crome, MD PCP - Cardiology Cardiology Admissions 11/24/16     Objective  Vitals: BP 124/80 (BP Location: Right Arm, Patient Position: Sitting, Cuff Size: Large)   Pulse (!) 103   Temp 98 F (36.7 C) (Oral)   Ht 6' 1"  (1.854 m)   Wt 295 lb (133.8 kg)   SpO2 94%   BMI 38.92 kg/m  General:  Well developed, well nourished, no acute distress  Psych:  Alert  and oriented,normal mood and affect HEENT:  Normocephalic, atraumatic, non-icteric sclera, PERRL, oropharynx is without mass or exudate, supple neck without adenopathy, mass or thyromegaly Cardiovascular:  RRR without gallop, rub or murmur, nondisplaced PMI Respiratory:  Good breath sounds bilaterally, CTAB with normal respiratory effort Gastrointestinal: normal bowel sounds, soft, non-tender, no noted masses. No HSM MSK: no deformities, contusions. Joints are without erythema or swelling Skin:  Warm, no rashes or suspicious lesions noted Neurologic:    Mental status is normal. Gross motor and sensory exams are normal. Normal gait  Assessment  1. Controlled type 2 diabetes mellitus with complication, without long-term current use of insulin ([place])   2. Coronary artery disease due to lipid rich plaque   3. Mixed hyperlipidemia   4. History of cerebral venous sinus thrombosis   5. Obesity (BMI 30-39.9)   6. Chewing tobacco nicotine dependence without complication   7. Encounter for tobacco use cessation counseling      Plan  MDM: complex, multiple problems, reviewed multiple records/images reports   DM:  This medical condition is well controlled. Had long discussion regarding multiple complications; encouraged to stay on diet and exercise plan. Now diet controlled. Recheck 3 months. Check urine MAC ratio and add Ace if indicated.  CAD: stable now s/p LAD stent. F/u per cards. On anticoagulants. High bleeding risk.   Hyperlipidemia f/u: await fasting lipids from cards. Continue high dose statin and low fat diet  Obesity: continue with weight loss plan  Chewing tobacco/nicotine dependence: wants to quit. Discussed risk of nicotine to heart and blood clotting pathways. Recommend high dose patch and nicotine gum. Risks/benefits/side effects discussed. He is motivated and likely to change given recent heart events. Addictive personality. Smoking cessation instruction/counseling given:  counseled  patient on the dangers of tobacco use, advised patient to stop smoking, and reviewed strategies to maximize success Follow up:  Return in about 3 months (around 03/10/2017) for follow up Diabetes.  Commons side effects, risks, benefits, and alternatives for medications and treatment plan prescribed today were discussed, and the patient expressed understanding of the given instructions. Patient is instructed to call or message via MyChart if he/she has any questions or concerns regarding our treatment plan. No barriers to understanding were identified. We discussed Red Flag symptoms and signs in detail. Patient expressed understanding regarding what to do in case of urgent or emergency type symptoms.   Medication list was reconciled, printed and provided to the patient in AVS. Patient instructions and summary information was reviewed with the patient as documented in the  AVS. This note was prepared with assistance of Dragon voice recognition software. Occasional wrong-word or sound-a-like substitutions may have occurred due to the inherent limitations of voice recognition software  Orders Placed This Encounter  Procedures  . Microalbumin / creatinine urine ratio   No orders of the defined types were placed in this encounter.

## 2016-12-08 NOTE — Telephone Encounter (Signed)
Attempted to call patient - Lm on Vm

## 2016-12-14 ENCOUNTER — Ambulatory Visit: Payer: BLUE CROSS/BLUE SHIELD | Admitting: Cardiovascular Disease

## 2016-12-15 ENCOUNTER — Telehealth (HOSPITAL_COMMUNITY): Payer: Self-pay

## 2016-12-15 ENCOUNTER — Encounter (HOSPITAL_COMMUNITY): Payer: Self-pay

## 2016-12-15 NOTE — Telephone Encounter (Signed)
2nd attempt to call patient in regards to Cardiac Rehab - Lm on Vm. Sent letter. °

## 2016-12-21 ENCOUNTER — Encounter: Payer: Self-pay | Admitting: Family Medicine

## 2016-12-21 ENCOUNTER — Encounter: Payer: Self-pay | Admitting: Cardiovascular Disease

## 2016-12-22 ENCOUNTER — Other Ambulatory Visit: Payer: Self-pay | Admitting: Pharmacist Clinician (PhC)/ Clinical Pharmacy Specialist

## 2016-12-22 ENCOUNTER — Other Ambulatory Visit: Payer: Self-pay

## 2016-12-22 ENCOUNTER — Telehealth (HOSPITAL_COMMUNITY): Payer: Self-pay

## 2016-12-22 MED ORDER — CLOPIDOGREL BISULFATE 75 MG PO TABS: 75.0000 mg | ORAL_TABLET | Freq: Every day | ORAL | 2 refills | Status: DC

## 2016-12-22 MED ORDER — RIVAROXABAN 20 MG PO TABS: 20.0000 mg | ORAL_TABLET | Freq: Every day | ORAL | 1 refills | Status: DC

## 2016-12-22 MED ORDER — RIVAROXABAN 20 MG PO TABS: 20.0000 mg | ORAL_TABLET | Freq: Every day | ORAL | 2 refills | Status: DC

## 2016-12-22 NOTE — Telephone Encounter (Signed)
Called and spoke with patient in regards to Cardiac Rehab - Patient stated he has a Systems analyst that specializes in Cardio. Patient not interested in program at this time. Closed referral.

## 2016-12-22 NOTE — Telephone Encounter (Signed)
Discussed with Dr. Mardelle Matte pt needs refills on Xarelto and Plavix. Okay to refill medications per Dr. Mardelle Matte. Rx refills sent to pharmacy.

## 2016-12-22 NOTE — Telephone Encounter (Signed)
°*  STAT* If patient is at the pharmacy, call can be transferred to refill team.   1. Which medications need to be refilled? (please list name of each medication and dose if known) plavix and xarelto   2. Which pharmacy/location (including street and city if local pharmacy) is medication to be sent to? [facility] on [address]   3. Do they need a 30 day or 90 day supply? 90

## 2017-03-07 ENCOUNTER — Encounter: Payer: Self-pay | Admitting: Family Medicine

## 2017-03-07 ENCOUNTER — Ambulatory Visit (INDEPENDENT_AMBULATORY_CARE_PROVIDER_SITE_OTHER): Payer: BLUE CROSS/BLUE SHIELD | Admitting: Family Medicine

## 2017-03-07 ENCOUNTER — Other Ambulatory Visit: Payer: Self-pay

## 2017-03-07 VITALS — BP 104/72 | HR 98 | Temp 98.4°F | Ht 73.0 in | Wt 277.6 lb

## 2017-03-07 DIAGNOSIS — Z7901 Long term (current) use of anticoagulants: Secondary | ICD-10-CM

## 2017-03-07 DIAGNOSIS — E782 Mixed hyperlipidemia: Secondary | ICD-10-CM

## 2017-03-07 DIAGNOSIS — E1149 Type 2 diabetes mellitus with other diabetic neurological complication: Secondary | ICD-10-CM | POA: Diagnosis not present

## 2017-03-07 DIAGNOSIS — E669 Obesity, unspecified: Secondary | ICD-10-CM | POA: Diagnosis not present

## 2017-03-07 DIAGNOSIS — I2583 Coronary atherosclerosis due to lipid rich plaque: Secondary | ICD-10-CM

## 2017-03-07 DIAGNOSIS — Z716 Tobacco abuse counseling: Secondary | ICD-10-CM

## 2017-03-07 DIAGNOSIS — I2510 Atherosclerotic heart disease of native coronary artery without angina pectoris: Secondary | ICD-10-CM

## 2017-03-07 LAB — COMPREHENSIVE METABOLIC PANEL
ALBUMIN: 4.4 g/dL (ref 3.5–5.2)
ALT: 44 U/L (ref 0–53)
AST: 21 U/L (ref 0–37)
Alkaline Phosphatase: 60 U/L (ref 39–117)
BILIRUBIN TOTAL: 0.6 mg/dL (ref 0.2–1.2)
BUN: 17 mg/dL (ref 6–23)
CO2: 25 mEq/L (ref 19–32)
Calcium: 10 mg/dL (ref 8.4–10.5)
Chloride: 101 mEq/L (ref 96–112)
Creatinine, Ser: 1.2 mg/dL (ref 0.40–1.50)
GFR: 69.35 mL/min (ref >60.00)
Glucose, Bld: 62 mg/dL — ABNORMAL LOW (ref 70–99)
POTASSIUM: 4.6 meq/L (ref 3.5–5.1)
Sodium: 138 mEq/L (ref 135–145)
TOTAL PROTEIN: 7.4 g/dL (ref 6.0–8.3)

## 2017-03-07 LAB — LIPID PANEL
CHOLESTEROL: 332 mg/dL — AB (ref 0–200)
HDL: 19.6 mg/dL — AB (ref >39.00)
LDL Cholesterol: 287 mg/dL — ABNORMAL HIGH (ref 0–99)
NonHDL: 312.8
TRIGLYCERIDES: 128 mg/dL (ref 0.0–149.0)
Total CHOL/HDL Ratio: 17
VLDL: 25.6 mg/dL (ref 0.0–40.0)

## 2017-03-07 LAB — POCT GLYCOSYLATED HEMOGLOBIN (HGB A1C): HEMOGLOBIN A1C: 5.3

## 2017-03-07 MED ORDER — ATORVASTATIN CALCIUM 80 MG PO TABS: 80.0000 mg | ORAL_TABLET | Freq: Every day | ORAL | 3 refills | Status: DC

## 2017-03-07 NOTE — Patient Instructions (Signed)
Please return in 3 months, fasting.  If you have any questions or concerns, please don't hesitate to send me a message via MyChart or call the office at [phone]. Thank you for visiting with [place] today! It's our pleasure caring for you.  Keep up the good work with diet and exercise.   Work on quitting the smokeless tobacco: use other things and the patch but at a lower dose.

## 2017-03-07 NOTE — Progress Notes (Signed)
Subjective  CC:  Chief Complaint  Patient presents with  . Diabetes  . Hypertension  . Hyperlipidemia  . Nicotine Dependence    HPI: [name] Holland is a 47 y.o. male who presents to the office today for follow up of diabetes and problems listed above in the chief complaint.   Diabetes follow up: His diabetic control is reported as Unchanged. Has good control with diet alone. Weight loss continues. Exercise continues.  He denies exertional CP or SOB or symptomatic hypoglycemia. He denies foot sores or paresthesias.   CAD - stable w/o cp.   Anticoagulation: remains on xarelto and plavix. No bleeding problems  Lipids: stopped lipitor; thought Dr. Allyson Sabal told him to. Never went back for fasting lipid testing on meds. No AEs  Unfortunately still using snuff: tried nicotine patch and gum: got nausea from patch and didn't like gum.   Immunization History  Administered Date(s) Administered  . Influenza,inj,quad, With Preservative 11/29/2016  . Influenza-Unspecified 02/26/2016  . Pneumococcal Polysaccharide-23 06/02/2014  . Tdap 04/21/2015   Wt Readings from Last 3 Encounters:  03/07/17 277 lb 9.6 oz (125.9 kg)  12/08/16 295 lb (133.8 kg)  12/07/16 297 lb (134.7 kg)   . Diabetes Related Lab Review: Lab Results  Component Value Date   HGBA1C 5.3 03/07/2017   Lab Results  Component Value Date   MICROALBUR 31.2 (H) 12/08/2016   Lab Results  Component Value Date   CREATININE 0.81 11/25/2016   BUN 10 11/25/2016   NA 139 11/25/2016   K 4.3 11/25/2016   CL 109 11/25/2016   CO2 25 11/25/2016   Lab Results  Component Value Date   CHOL 197 06/02/2016   CHOL 227 (H) 06/01/2014   CHOL (H) 11/09/2007    204        ATP III CLASSIFICATION:  <200     mg/dL   Desirable  010-071  mg/dL   Borderline High  >=219    mg/dL   High   Lab Results  Component Value Date   HDL 28 (L) 06/02/2016   HDL 22 (L) 06/01/2014   HDL 22 (L) 11/09/2007   Lab Results  Component Value Date     LDLCALC 138 (H) 06/02/2016   LDLCALC 150 (H) 06/01/2014   LDLCALC (H) 11/09/2007    140        Total Cholesterol/HDL:CHD Risk Coronary Heart Disease Risk Table                     Men   Women  1/2 Average Risk   3.4   3.3   Lab Results  Component Value Date   TRIG 157 (H) 06/02/2016   TRIG 277 (H) 06/01/2014   TRIG 211 (H) 11/09/2007   Lab Results  Component Value Date   CHOLHDL 7.0 06/02/2016   CHOLHDL 10.3 06/01/2014   CHOLHDL 9.3 11/09/2007   No results found for: LDLDIRECT The ASCVD Risk score Denman George DC Jr., et al., 2013) failed to calculate for the following reasons:   The patient has a prior MI or stroke diagnosis I have reviewed the PMH, Fam and Soc history. Patient Active Problem List   Diagnosis Date Noted  . Coronary artery disease due to lipid rich plaque     Priority: High  . Non-ST elevation (NSTEMI) myocardial infarction [facility])     Priority: High  . History of DVT (deep vein thrombosis) 11/23/2016    Priority: High  . History of cerebral venous sinus  thrombosis 08/31/2016    Priority: High  . Long term current use of anticoagulant 06/17/2016    Priority: High  . Mixed hyperlipidemia 05/31/2016    Priority: High  . Obesity (BMI 30-39.9)     Priority: High  . CVA (cerebral infarction) 05/31/2014    Priority: High  . 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 Bilateral primary osteoarthritis of knee 04/21/2015    Priority: Low  . Cocaine abuse in remission [facility]) 04/21/2015    Priority: Low    Social History: Patient  reports that he quit smoking about 5 years ago. His smoking use included cigarettes. He has a 24.00 pack-year smoking history. His smokeless tobacco use includes snuff. He reports that he does not drink alcohol or use  drugs.  Review of Systems: Ophthalmic: negative for eye pain, loss of vision or double vision Cardiovascular: negative for chest pain Respiratory: negative for SOB or persistent cough Gastrointestinal: negative for abdominal pain Genitourinary: negative for dysuria or gross hematuria MSK: negative for foot lesions Neurologic: negative for weakness or gait disturbance  Objective  Vitals: BP 104/72   Pulse 98   Temp 98.4 F (36.9 C)   Ht 6\' 1"  (1.854 m)   Wt 277 lb 9.6 oz (125.9 kg)   BMI 36.62 kg/m  General: well appearing, no acute distress  Psych:  Alert and oriented, normal mood and affect HEENT:  Normocephalic, atraumatic, moist mucous membranes, supple neck  Cardiovascular:  Nl S1 and S2, RRR without murmur, gallop or rub. no edema Respiratory:  Good breath sounds bilaterally, CTAB with normal effort, no rales Gastrointestinal: normal BS, soft, nontender Skin:  Warm, no rashes Neurologic:   Mental status is normal. normal gait Foot exam: no erythema, pallor, or cyanosis visible nl proprioception and sensation to monofilament testing bilaterally, +2 distal pulses bilaterally    Assessment  1. Controlled type 2 diabetes mellitus with other neurologic complication, without long-term current use of insulin (HCC)   2. Coronary artery disease due to lipid rich plaque   3. Long term current use of anticoagulant   4. Mixed hyperlipidemia   5. Obesity (BMI 30-39.9)   6. Encounter for tobacco use cessation counseling      Plan   Diabetes is currently very well controlled. Diet controlled. conitnue weight loss  Lipids and cad: needs to be on statin. Counseling done. Check lipids today and then again in 3 months.   High risk for bleed on anticoagulation. Stable on meds per cards.  Counseling again done for tobacco cessation. Decrease dose of nicotine patch and work on hand/oral fixation with other things and/or lozenges. Reinforced importance of quitting.  Diabetic  education: ongoing education regarding chronic disease management for diabetes was given today. We continue to reinforce the ABC's of diabetic management: A1c (<7 or 8 dependent upon patient), tight blood pressure control, and cholesterol management with goal LDL < 100 minimally. We discuss diet strategies, exercise recommendations, medication options and possible side effects. At each visit, we review recommended immunizations and preventive care recommendations for diabetics and stress that good diabetic control can prevent other problems. See below for this patient's data.  Follow up: Return in about 3 months (around 06/04/2017) for follow up Diabetes, follow up hypercholesterolemia..   Commons side effects, risks, benefits, and alternatives for medications  and treatment plan prescribed today were discussed, and the patient expressed understanding of the given instructions. Patient is instructed to call or message via MyChart if he/she has any questions or concerns regarding our treatment plan. No barriers to understanding were identified. We discussed Red Flag symptoms and signs in detail. Patient expressed understanding regarding what to do in case of urgent or emergency type symptoms.   Medication list was reconciled, printed and provided to the patient in AVS. Patient instructions and summary information was reviewed with the patient as documented in the AVS. This note was prepared with assistance of Dragon voice recognition software. Occasional wrong-word or sound-a-like substitutions may have occurred due to the inherent limitations of voice recognition software  Orders Placed This Encounter  Procedures  . Lipid panel  . Comprehensive metabolic panel  . POCT glycosylated hemoglobin (Hb A1C)   Meds ordered this encounter  Medications  . atorvastatin (LIPITOR) 80 MG tablet    Sig: Take 1 tablet (80 mg total) by mouth daily at 6 PM.    Dispense:  90 tablet    Refill:  3

## 2017-03-31 ENCOUNTER — Other Ambulatory Visit: Payer: Self-pay | Admitting: Family Medicine

## 2017-05-06 ENCOUNTER — Emergency Department (HOSPITAL_COMMUNITY): Payer: BLUE CROSS/BLUE SHIELD

## 2017-05-06 ENCOUNTER — Encounter (HOSPITAL_COMMUNITY): Payer: Self-pay | Admitting: Emergency Medicine

## 2017-05-06 ENCOUNTER — Emergency Department (HOSPITAL_COMMUNITY)
Admission: EM | Admit: 2017-05-06 | Discharge: 2017-05-06 | Disposition: A | Discharge status: Home / Self Care | Payer: BLUE CROSS/BLUE SHIELD | Source: Home / Self Care

## 2017-05-06 ENCOUNTER — Telehealth: Payer: Self-pay | Admitting: Cardiovascular Disease

## 2017-05-06 ENCOUNTER — Other Ambulatory Visit: Payer: Self-pay

## 2017-05-06 ENCOUNTER — Encounter (HOSPITAL_COMMUNITY): Payer: Self-pay | Admitting: *Deleted

## 2017-05-06 ENCOUNTER — Emergency Department (HOSPITAL_COMMUNITY)
Admission: EM | Admit: 2017-05-06 | Discharge: 2017-05-06 | Disposition: A | Discharge status: Home / Self Care | Payer: BLUE CROSS/BLUE SHIELD | Attending: Emergency Medicine | Admitting: Emergency Medicine

## 2017-05-06 DIAGNOSIS — R42 Dizziness and giddiness: Secondary | ICD-10-CM | POA: Diagnosis not present

## 2017-05-06 DIAGNOSIS — Z5321 Procedure and treatment not carried out due to patient leaving prior to being seen by health care provider: Secondary | ICD-10-CM | POA: Insufficient documentation

## 2017-05-06 DIAGNOSIS — R079 Chest pain, unspecified: Secondary | ICD-10-CM | POA: Insufficient documentation

## 2017-05-06 DIAGNOSIS — R0602 Shortness of breath: Secondary | ICD-10-CM | POA: Diagnosis not present

## 2017-05-06 LAB — BASIC METABOLIC PANEL
ANION GAP: 10 (ref 5–15)
ANION GAP: 8 (ref 5–15)
BUN: 17 mg/dL (ref 6–20)
BUN: 15 mg/dL (ref 6–20)
CALCIUM: 9.1 mg/dL (ref 8.9–10.3)
CALCIUM: 9.2 mg/dL (ref 8.9–10.3)
CO2: 22 mmol/L (ref 22–32)
CO2: 22 mmol/L (ref 22–32)
Chloride: 102 mmol/L (ref 101–111)
Chloride: 105 mmol/L (ref 101–111)
Creatinine, Ser: 1.09 mg/dL (ref 0.61–1.24)
Creatinine, Ser: 0.95 mg/dL (ref 0.61–1.24)
GFR calc Af Amer: >60 mL/min (ref >60)
GFR calc non Af Amer: >60 mL/min (ref >60)
GLUCOSE: 103 mg/dL — AB (ref 65–99)
GLUCOSE: 85 mg/dL (ref 65–99)
POTASSIUM: 3.8 mmol/L (ref 3.5–5.1)
Potassium: 4.1 mmol/L (ref 3.5–5.1)
SODIUM: 134 mmol/L — AB (ref 135–145)
SODIUM: 135 mmol/L (ref 135–145)

## 2017-05-06 LAB — CBC
HCT: 42.9 % (ref 39.0–52.0)
HCT: 43.8 % (ref 39.0–52.0)
HEMOGLOBIN: 14.2 g/dL (ref 13.0–17.0)
HEMOGLOBIN: 14.2 g/dL (ref 13.0–17.0)
MCH: 28.8 pg (ref 26.0–34.0)
MCH: 28.2 pg (ref 26.0–34.0)
MCHC: 32.4 g/dL (ref 30.0–36.0)
MCHC: 33.1 g/dL (ref 30.0–36.0)
MCV: 87 fL (ref 78.0–100.0)
MCV: 87.1 fL (ref 78.0–100.0)
Platelets: 248 10*3/uL (ref 150–400)
Platelets: 228 10*3/uL (ref 150–400)
RBC: 4.93 MIL/uL (ref 4.22–5.81)
RBC: 5.03 MIL/uL (ref 4.22–5.81)
RDW: 15.5 % (ref 11.5–15.5)
RDW: 15.5 % (ref 11.5–15.5)
WBC: 7.3 10*3/uL (ref 4.0–10.5)
WBC: 8.9 10*3/uL (ref 4.0–10.5)

## 2017-05-06 LAB — I-STAT TROPONIN, ED
TROPONIN I, POC: 0 ng/mL (ref 0.00–0.08)
TROPONIN I, POC: 0 ng/mL (ref 0.00–0.08)

## 2017-05-06 NOTE — ED Triage Notes (Signed)
Pt reports CP present X1 hr. Central, non radiating. Tight in nature, 5/10. SOB, lightheadedness.

## 2017-05-06 NOTE — ED Triage Notes (Signed)
Pt c/o Mid CP radiating to L wrist onset last night, pt seen here and [facility]BS after triage, sent by here by cardiologist on phone and told to come here, pt states, "they said Rosann Auerbach could see me today." denies current SOB, n/v/d, A&O x4

## 2017-05-06 NOTE — Telephone Encounter (Signed)
New message:     Pt c/o of Chest Pain: STAT if CP now or developed within 24 hours  1. Are you having CP right now? Yes  2. Are you experiencing any other symptoms (ex. SOB, nausea, vomiting, sweating)? Aches in arm, shoulder and jaw. Pt states is feels like someone is sitting on his chest  3. How long have you been experiencing CP? Since last night  4. Is your CP continuous or coming and going? Coming and going  5. Have you taken Nitroglycerin? No ?

## 2017-05-06 NOTE — ED Notes (Signed)
Pt stated he is leaving due to extended wait times. Pt advised to stay and informed of the risks of leaving.

## 2017-05-06 NOTE — Telephone Encounter (Signed)
Patient called in with complaints of chest pressure and pain radiating to the jaw and arm. He went to the ED last night but left after having to wait. He is still having the same symptoms this morning. The patient stated that it feels the same way that it did last November when he had a cardiac cath and stents placed. He is currently taking Plavix 75 mg and Xarelto 20 mg.   He has been advised to go back to the ED for further assessment. He was in agreement. Cardmaster on call has been paged and made aware of the patient's situation and arrival.

## 2017-05-06 NOTE — ED Notes (Signed)
Pt stated to writer that he was leaving and going to his cardiologist in the am

## 2017-05-11 ENCOUNTER — Ambulatory Visit: Payer: BLUE CROSS/BLUE SHIELD | Admitting: Adult Health

## 2017-05-11 ENCOUNTER — Telehealth: Payer: Self-pay | Admitting: Cardiovascular Disease

## 2017-05-11 ENCOUNTER — Encounter: Payer: Self-pay | Admitting: Adult Health

## 2017-05-11 ENCOUNTER — Ambulatory Visit
Admission: RE | Admit: 2017-05-11 | Discharge: 2017-05-11 | Disposition: A | Discharge status: Home / Self Care | Payer: BLUE CROSS/BLUE SHIELD | Source: Ambulatory Visit | Attending: Adult Health | Admitting: Adult Health

## 2017-05-11 VITALS — BP 112/74 | HR 80 | Ht 73.0 in | Wt 272.6 lb

## 2017-05-11 DIAGNOSIS — R51 Headache: Secondary | ICD-10-CM | POA: Diagnosis not present

## 2017-05-11 DIAGNOSIS — I2510 Atherosclerotic heart disease of native coronary artery without angina pectoris: Secondary | ICD-10-CM | POA: Diagnosis not present

## 2017-05-11 DIAGNOSIS — I82432 Acute embolism and thrombosis of left popliteal vein: Secondary | ICD-10-CM

## 2017-05-11 DIAGNOSIS — R519 Headache, unspecified: Secondary | ICD-10-CM

## 2017-05-11 DIAGNOSIS — D6859 Other primary thrombophilia: Secondary | ICD-10-CM

## 2017-05-11 DIAGNOSIS — E7800 Pure hypercholesterolemia, unspecified: Secondary | ICD-10-CM | POA: Diagnosis not present

## 2017-05-11 MED ORDER — IOPAMIDOL (ISOVUE-300) INJECTION 61%
75.0000 mL | Freq: Once | INTRAVENOUS | Status: AC | PRN
  Administered 2017-05-11: 75 mL via INTRAVENOUS

## 2017-05-11 MED ORDER — EZETIMIBE 10 MG PO TABS: 10.0000 mg | ORAL_TABLET | Freq: Every day | ORAL | 3 refills | Status: DC

## 2017-05-11 MED ORDER — NITROGLYCERIN 0.4 MG SL SUBL: 0.4000 mg | SUBLINGUAL_TABLET | SUBLINGUAL | 3 refills | Status: DC | PRN

## 2017-05-11 MED ORDER — ATORVASTATIN CALCIUM 80 MG PO TABS: 80.0000 mg | ORAL_TABLET | Freq: Every day | ORAL | 3 refills | Status: DC

## 2017-05-11 NOTE — Telephone Encounter (Signed)
New message  Pt verbalzied that he is calling for RN  Pt stated that he is starting to get terrible headaches on   The right side of his head when he does any strenuous activity

## 2017-05-11 NOTE — Progress Notes (Signed)
Cardiology Office Note   Date:  05/11/2017   ID:  [name] Holland, [name] Holland 09-Jun-1970, MRN [id number]  PCP:  Willow Ora, MD  Cardiologist: Allyson Sabal   Chief Complaint  Patient presents with  . Follow-up    CHEST PAIN LAST WEEK AND HEADACH YESTERDAY AND TODAY  . Coronary Artery Disease  . Chest Pain     History of Present Illness: [name] Holland is a 47 y.o. male who presents for chest pain with ongoing assessment and management of CAD. He apparently had chest pain and was directed to ER but did not want to wait. Other history includes DVT, diabetes, and CVA.   He had cardiac cath 11/24/2016 in the setting of recurrent chest pain revealing thrombotic high-grade proximal LAD stenosis that was stented with a Resolute DES after undergoing aspiration thrombectomy. When last seen by Dr. Allyson Sabal on 11/ 27/2018 he was continued on triple antiplatelet therapy for 4 weeks and then changed to DAPT with Xarelto and Plavix   He had chest pain last week with right substernal chest pain.  This was associated with tingling in his left wrist and right shoulder discomfort similar to the pain he experienced prior to his prior MI.  The patient was seen in the emergency room with negative troponins type II and a normal EKG.  The patient did not like having to wait greater than 5 hours for follow-up to be seen by a physician and therefore he came back home.  The chest discomfort lasted approximately 45 minutes.  Patient does not have nitroglycerin sublingual to take.  Two days later the patient had a severe sharp pain in his right temple, which was debilitating to him.  The patient states it worsened with exertion, sexual activity, or working out.  This will stop his activities..  Patient did take over-the-counter medication but it was not helpful.  The patient continues to have a sharp right temple pain.  The headache has never fully gone away he denies any blurred vision, ataxia, numbness, or neurologic issues at  this time.   Past Medical History:  Diagnosis Date  . Acute deep vein thrombosis (DVT) of femoral vein (HCC)    dVT  . Arthritis    "knees" (11/23/2016)  . Clotting disorder (HCC)   . Cocaine abuse in remission (HCC) 2014  . Coronary artery disease due to lipid rich plaque   . DVT (deep venous thrombosis) (HCC) 05/2016   LLE  . 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 Hyperlipidemia   . Intracranial and intraspinal phlebitis and thrombophlebitis 05/31/2014  . Myocardial infarction (HCC) 2016   "caused by blood clot in the left side of my head"  . Stroke (HCC) 05/2014   denies residual on 11/23/2016  . Type II diabetes mellitus (HCC)     Past Surgical History:  Procedure Laterality Date  . CARDIAC CATHETERIZATION  2008  . CORONARY STENT INTERVENTION N/A 11/24/2016   Procedure: CORONARY STENT INTERVENTION;  Surgeon: Kathleene Hazel, MD;  [facility];  Service: Cardiovascular;  Laterality: N/A;  . CORONARY THROMBECTOMY N/A 11/24/2016   Procedure: Coronary Thrombectomy;  Surgeon: Kathleene Hazel, MD;  Location: [facility];  Service: Cardiovascular;  Laterality: N/A;  . HIP SURGERY Right   . KNEE ARTHROSCOPY Bilateral early 2000s  . LEFT HEART CATH AND CORONARY ANGIOGRAPHY N/A 11/24/2016   Procedure: LEFT HEART CATH AND CORONARY ANGIOGRAPHY;  Surgeon: Kathleene Hazel, MD;  [facility];  Service: Cardiovascular;  Laterality: N/A;  . WRIST SURGERY Right      Current Outpatient Medications  Medication Sig Dispense Refill  . atorvastatin (LIPITOR) 80 MG tablet Take 1 tablet (80 mg total) by mouth daily at 6 PM. 90 tablet 3  . clopidogrel (PLAVIX) 75 MG tablet TAKE 1 TABLET(75 MG) BY MOUTH DAILY 90 tablet 3  . rivaroxaban (XARELTO) 20 MG TABS tablet Take 1 tablet (20 mg total) by mouth daily with supper. 90 tablet 1  . ezetimibe (ZETIA) 10 MG tablet Take 1 tablet  (10 mg total) by mouth daily. 90 tablet 3  . nitroGLYCERIN (NITROSTAT) 0.4 MG SL tablet Place 1 tablet (0.4 mg total) under the tongue every 5 (five) minutes as needed for chest pain. 90 tablet 3   No current facility-administered medications for this visit.     Allergies:   Patient has no known allergies.    Social History:  The patient  reports that he quit smoking about 5 years ago. His smoking use included cigarettes. He has a 24.00 pack-year smoking history. His smokeless tobacco use includes snuff. He reports that he does not drink alcohol or use drugs.   Family History:  The patient's family history includes Asthma in his mother; CAD in his father and maternal grandmother; Cancer in his paternal grandfather; Cerebral aneurysm in his paternal grandmother; Diabetes in his mother; Diabetes type II in his mother; Hyperlipidemia in his father; Hypertension in his father; Lung cancer in his maternal aunt; Other in his mother; Pancreatic cancer in his maternal aunt.    ROS: All other systems are reviewed and negative. Unless otherwise mentioned in H&P    PHYSICAL EXAM: VS:  BP 112/74 (BP Location: Left Arm)   Pulse 80   Ht 6\' 1"  (1.854 m)   Wt 272 lb 9.6 oz (123.7 kg)   BMI 35.97 kg/m  , BMI Body mass index is 35.97 kg/m. GEN: Well nourished, well developed, in no acute distress  HEENT: normal  Neck: no JVD, carotid bruits, or masses Cardiac: RRR; no murmurs, rubs, or gallops,no edema  Respiratory:  clear to auscultation bilaterally, normal work of breathing GI: soft, nontender, nondistended, + BS MS: no deformity or atrophy  Skin: warm and dry, no rash Neuro:  Strength and sensation are intact Psych: euthymic mood, full affect   EKG: Normal sinus rhythm, ventricular rate of 80 bpm, inferior Q waves in lead III only.   Recent Labs: 03/07/2017: ALT 44 05/06/2017: BUN 15; Creatinine, Ser 0.95; Hemoglobin 14.2; Platelets 228; Potassium 4.1; Sodium 135    Lipid Panel      Component Value Date/Time   CHOL 332 (H) 03/07/2017 1033   TRIG 128.0 03/07/2017 1033   HDL 19.60 (L) 03/07/2017 1033   CHOLHDL 17 03/07/2017 1033   VLDL 25.6 03/07/2017 1033   LDLCALC 287 (H) 03/07/2017 1033      Wt Readings from Last 3 Encounters:  05/11/17 272 lb 9.6 oz (123.7 kg)  05/06/17 265 lb (120.2 kg)  05/06/17 265 lb (120.2 kg)      Other studies Reviewed: Cardiac Cath 11/24/2016 Conclusion     Ost LAD to Prox LAD lesion is 90% stenosed.  Post intervention, there is a 0% residual stenosis.  A drug-eluting stent was successfully placed using a STENT RESOLUTE ONYX 4.5X15.  The left ventricular systolic function is normal.  LV end diastolic pressure is normal.  The left ventricular ejection fraction is 55-65% by visual estimate.  There is no mitral valve regurgitation.   1. Severe filling defect in the proximal LAD that appeared to be a thrombus at the site of a likely ruptured plaque.  2. No disease in the RCA or Circumflex.  3. Successful PTCA/DES x 1 proximal LAD 4. Normal LV systolic function 5. Thrombus formation in the LAD that is likely related to an underlying hypercoagulable disorder given his history of prior DVT and intracranial venous thrombus.   Recommendations: I will continue Aggrastat for 18 hours given the thrombus burden. He was loaded with Brilinta in the cath lab but after discussion with the patient's mother following the cath, it appears that he may have a hypercoagulable disorder. He may be best managed with Xarelto and Plavix post discharge for a year along with ASA for one month. Will start high intensity statin. Beta blocker as tolerated.       ASSESSMENT AND PLAN:  1. Chest Pain: One recurrence last week lasting 30-45 minutes. He sought medical treatment in ER and was ruled out for ACS. He is now chest pain free. He will continue current medications as directed. Can consider NM study to evaluate further once CT scan is  completed.  2. Severe H/A: Described as sharp piercing pain in right temple with pressure behind his eyes. He states it is worse with exertion. BP is normal, he has no neurologic symptoms. I will send patient for CT of the brain.  I discussed this with Dr. Allyson Sabal who agrees and recommends that the patient go to the ER. The patient is reluctant to go to ER due to wait time. I have contact [facility] and they are willing to take him as a walk in.   If no abnormalities are seen, he will need to be referred back to PCP or neurology for further work up. No changes in anticoagulation at this time unless a bleed is noted. He has recent labs and therefore will not repeat prior to CT.   3. CAD:  DES to proximal LAD. Thrombosis at the site of the ruptured plaque. He continues on DAPT with Plavix and Xarelto.   4. DVT: On Xarelto. No further episodes.   5, Hypercoagulable state: Continues DAPT.  6. Uncontrolled Hypercholesterolemia: He has been out of statin therapy. I will restart Lipitor 80 mg daily and add Zetia. Last labs reveled TC: 332, LDL 287, TG 120. Will need to repeat this lab in 6 weeks.     Current medicines are reviewed at length with the patient today.    Labs/ tests ordered today include: CT brain with and without  Bettey Mare. Liborio Nixon, ANP, AACC   05/11/2017 2:48 PM    [facility] [address] [place], [place] [address] Phone: [phone]; Fax: [phone]

## 2017-05-11 NOTE — Patient Instructions (Signed)
Medication Instructions:  START ATORVASTATIN 80MG - TAKE AT NIGHT  START ZETIA 10MG -TAKE AT NIGHT  RX SENT FOR NITRO-TAKE 5 MINUTES APART EVERY 5 MINUTES ONLY 3 TIMES PER EPISODE  If you need a refill on your cardiac medications before your next appointment, please call your pharmacy.  Testing/Procedures: Head CT scanning, (CAT scanning), is a noninvasive, special x-ray that produces cross-sectional images of the body using x-rays and a computer. CT scans help physicians diagnose and treat medical conditions. For some CT exams, a contrast material is used to enhance visibility in the area of the body being studied. CT scans provide greater clarity and reveal more details than regular x-ray exams.   Thank you for choosing [facility] at [facility]!!

## 2017-05-11 NOTE — Telephone Encounter (Signed)
Spoke with Pt who states he developed CP on 4/25 that was similar to when he had to have a cath and stent placed back in nov. He went to the ED but left due to the long wait. He contacted our office 4/26 having the same symptoms and was advised to return back to the ED. Pt verbalized understand and returned but left again due to the wait.   Pt denies any current CP or SOB but states he is experiencing serve headaches on right sides that is brought on with strenuous activities or heavy breathing. Appointment made with Dr. Joni Reining today 5/1 at 1:30pm.

## 2017-05-12 ENCOUNTER — Other Ambulatory Visit: Payer: Self-pay

## 2017-05-12 ENCOUNTER — Encounter: Payer: Self-pay | Admitting: Neurology

## 2017-05-12 DIAGNOSIS — R51 Headache: Secondary | ICD-10-CM

## 2017-05-12 DIAGNOSIS — R9089 Other abnormal findings on diagnostic imaging of central nervous system: Secondary | ICD-10-CM

## 2017-05-12 DIAGNOSIS — R519 Headache, unspecified: Secondary | ICD-10-CM

## 2017-05-12 DIAGNOSIS — I639 Cerebral infarction, unspecified: Secondary | ICD-10-CM

## 2017-05-12 NOTE — Addendum Note (Signed)
Addended by: Alyson Ingles on: 05/12/2017 09:55 AM   Modules accepted: Orders

## 2017-05-30 ENCOUNTER — Ambulatory Visit: Payer: BLUE CROSS/BLUE SHIELD | Admitting: Family Medicine

## 2017-05-30 DIAGNOSIS — Z0289 Encounter for other administrative examinations: Secondary | ICD-10-CM

## 2017-06-14 ENCOUNTER — Ambulatory Visit: Payer: BLUE CROSS/BLUE SHIELD | Admitting: Neurology

## 2017-07-17 ENCOUNTER — Encounter (HOSPITAL_COMMUNITY): Payer: Self-pay | Admitting: Emergency Medicine

## 2017-07-17 ENCOUNTER — Other Ambulatory Visit: Payer: Self-pay

## 2017-07-17 ENCOUNTER — Emergency Department (HOSPITAL_COMMUNITY): Payer: BLUE CROSS/BLUE SHIELD

## 2017-07-17 ENCOUNTER — Observation Stay (HOSPITAL_COMMUNITY)
Admission: EM | Admit: 2017-07-17 | Discharge: 2017-07-19 | Disposition: A | Discharge status: Home / Self Care | Payer: BLUE CROSS/BLUE SHIELD | Attending: Internal Medicine | Admitting: Internal Medicine

## 2017-07-17 DIAGNOSIS — D6859 Other primary thrombophilia: Secondary | ICD-10-CM | POA: Diagnosis not present

## 2017-07-17 DIAGNOSIS — I252 Old myocardial infarction: Secondary | ICD-10-CM | POA: Insufficient documentation

## 2017-07-17 DIAGNOSIS — Z809 Family history of malignant neoplasm, unspecified: Secondary | ICD-10-CM | POA: Insufficient documentation

## 2017-07-17 DIAGNOSIS — R000 Tachycardia, unspecified: Secondary | ICD-10-CM | POA: Diagnosis not present

## 2017-07-17 DIAGNOSIS — Z8249 Family history of ischemic heart disease and other diseases of the circulatory system: Secondary | ICD-10-CM | POA: Insufficient documentation

## 2017-07-17 DIAGNOSIS — Z6838 Body mass index (BMI) 38.0-38.9, adult: Secondary | ICD-10-CM | POA: Insufficient documentation

## 2017-07-17 DIAGNOSIS — I200 Unstable angina: Secondary | ICD-10-CM

## 2017-07-17 DIAGNOSIS — I25110 Atherosclerotic heart disease of native coronary artery with unstable angina pectoris: Secondary | ICD-10-CM | POA: Diagnosis not present

## 2017-07-17 DIAGNOSIS — E119 Type 2 diabetes mellitus without complications: Secondary | ICD-10-CM | POA: Diagnosis not present

## 2017-07-17 DIAGNOSIS — K760 Fatty (change of) liver, not elsewhere classified: Secondary | ICD-10-CM | POA: Diagnosis not present

## 2017-07-17 DIAGNOSIS — Z800 Family history of malignant neoplasm of digestive organs: Secondary | ICD-10-CM | POA: Diagnosis not present

## 2017-07-17 DIAGNOSIS — F1411 Cocaine abuse, in remission: Secondary | ICD-10-CM | POA: Insufficient documentation

## 2017-07-17 DIAGNOSIS — Z79899 Other long term (current) drug therapy: Secondary | ICD-10-CM | POA: Diagnosis not present

## 2017-07-17 DIAGNOSIS — I4510 Unspecified right bundle-branch block: Secondary | ICD-10-CM | POA: Diagnosis not present

## 2017-07-17 DIAGNOSIS — E669 Obesity, unspecified: Secondary | ICD-10-CM | POA: Diagnosis not present

## 2017-07-17 DIAGNOSIS — Z87891 Personal history of nicotine dependence: Secondary | ICD-10-CM | POA: Insufficient documentation

## 2017-07-17 DIAGNOSIS — Z7901 Long term (current) use of anticoagulants: Secondary | ICD-10-CM | POA: Insufficient documentation

## 2017-07-17 DIAGNOSIS — Z955 Presence of coronary angioplasty implant and graft: Secondary | ICD-10-CM | POA: Insufficient documentation

## 2017-07-17 DIAGNOSIS — R0789 Other chest pain: Secondary | ICD-10-CM

## 2017-07-17 DIAGNOSIS — Z801 Family history of malignant neoplasm of trachea, bronchus and lung: Secondary | ICD-10-CM | POA: Insufficient documentation

## 2017-07-17 DIAGNOSIS — E785 Hyperlipidemia, unspecified: Secondary | ICD-10-CM | POA: Diagnosis not present

## 2017-07-17 DIAGNOSIS — Z833 Family history of diabetes mellitus: Secondary | ICD-10-CM | POA: Insufficient documentation

## 2017-07-17 DIAGNOSIS — Z86718 Personal history of other venous thrombosis and embolism: Secondary | ICD-10-CM | POA: Insufficient documentation

## 2017-07-17 DIAGNOSIS — R079 Chest pain, unspecified: Secondary | ICD-10-CM | POA: Diagnosis not present

## 2017-07-17 DIAGNOSIS — Z825 Family history of asthma and other chronic lower respiratory diseases: Secondary | ICD-10-CM | POA: Diagnosis not present

## 2017-07-17 DIAGNOSIS — Z8673 Personal history of transient ischemic attack (TIA), and cerebral infarction without residual deficits: Secondary | ICD-10-CM | POA: Diagnosis not present

## 2017-07-17 DIAGNOSIS — E782 Mixed hyperlipidemia: Secondary | ICD-10-CM | POA: Insufficient documentation

## 2017-07-17 LAB — I-STAT TROPONIN, ED: Troponin i, poc: 0 ng/mL (ref 0.00–0.08)

## 2017-07-17 LAB — BASIC METABOLIC PANEL
Anion gap: 8 (ref 5–15)
BUN: 16 mg/dL (ref 6–20)
CO2: 25 mmol/L (ref 22–32)
CREATININE: 1.1 mg/dL (ref 0.61–1.24)
Calcium: 9.7 mg/dL (ref 8.9–10.3)
Chloride: 109 mmol/L (ref 98–111)
GFR calc Af Amer: >60 mL/min (ref >60)
GFR calc non Af Amer: >60 mL/min (ref >60)
GLUCOSE: 131 mg/dL — AB (ref 70–99)
POTASSIUM: 3.9 mmol/L (ref 3.5–5.1)
SODIUM: 142 mmol/L (ref 135–145)

## 2017-07-17 LAB — TROPONIN I: TROPONIN I: 0.03 ng/mL — AB (ref <0.03)

## 2017-07-17 LAB — CBC
HEMATOCRIT: 50.5 % (ref 39.0–52.0)
Hemoglobin: 15.9 g/dL (ref 13.0–17.0)
MCH: 28 pg (ref 26.0–34.0)
MCHC: 31.5 g/dL (ref 30.0–36.0)
MCV: 89.1 fL (ref 78.0–100.0)
Platelets: 214 10*3/uL (ref 150–400)
RBC: 5.67 MIL/uL (ref 4.22–5.81)
RDW: 15.4 % (ref 11.5–15.5)
WBC: 8.9 10*3/uL (ref 4.0–10.5)

## 2017-07-17 MED ORDER — ACETAMINOPHEN 325 MG PO TABS: 650.0000 mg | ORAL_TABLET | ORAL | Status: DC | PRN

## 2017-07-17 MED ORDER — HEPARIN BOLUS VIA INFUSION
4000.0000 [IU] | Freq: Once | INTRAVENOUS | Status: AC
  Administered 2017-07-17: 4000 [IU] via INTRAVENOUS
  Filled 2017-07-17: qty 4000

## 2017-07-17 MED ORDER — IOPAMIDOL (ISOVUE-370) INJECTION 76%
100.0000 mL | Freq: Once | INTRAVENOUS | Status: AC | PRN
  Administered 2017-07-17: 100 mL via INTRAVENOUS

## 2017-07-17 MED ORDER — SODIUM CHLORIDE 0.9 % IV BOLUS
1000.0000 mL | Freq: Once | INTRAVENOUS | Status: AC
  Administered 2017-07-17: 1000 mL via INTRAVENOUS

## 2017-07-17 MED ORDER — ASPIRIN 81 MG PO CHEW
324.0000 mg | CHEWABLE_TABLET | Freq: Once | ORAL | Status: AC
  Administered 2017-07-17: 324 mg via ORAL
  Filled 2017-07-17: qty 4

## 2017-07-17 MED ORDER — ASPIRIN EC 81 MG PO TBEC
81.0000 mg | DELAYED_RELEASE_TABLET | Freq: Every day | ORAL | Status: DC
  Administered 2017-07-18: 81 mg via ORAL
  Filled 2017-07-17: qty 1

## 2017-07-17 MED ORDER — HEPARIN (PORCINE) IN NACL 100-0.45 UNIT/ML-% IJ SOLN
1650.0000 [IU]/h | INTRAMUSCULAR | Status: DC
  Administered 2017-07-17: 1400 [IU]/h via INTRAVENOUS
  Administered 2017-07-18: 1650 [IU]/h via INTRAVENOUS
  Filled 2017-07-17 (×2): qty 250

## 2017-07-17 MED ORDER — ZOLPIDEM TARTRATE 5 MG PO TABS: 2.5000 mg | ORAL_TABLET | Freq: Every evening | ORAL | Status: DC | PRN

## 2017-07-17 MED ORDER — NITROGLYCERIN 0.4 MG SL SUBL
0.4000 mg | SUBLINGUAL_TABLET | SUBLINGUAL | Status: DC | PRN
  Administered 2017-07-17: 0.4 mg via SUBLINGUAL
  Filled 2017-07-17: qty 1

## 2017-07-17 MED ORDER — ATORVASTATIN CALCIUM 80 MG PO TABS
80.0000 mg | ORAL_TABLET | Freq: Every day | ORAL | Status: DC
  Administered 2017-07-18: 80 mg via ORAL
  Filled 2017-07-17: qty 1

## 2017-07-17 MED ORDER — ONDANSETRON HCL 4 MG/2ML IJ SOLN: 4.0000 mg | Freq: Four times a day (QID) | INTRAMUSCULAR | Status: DC | PRN

## 2017-07-17 MED ORDER — IOPAMIDOL (ISOVUE-370) INJECTION 76%
INTRAVENOUS | Status: AC
  Filled 2017-07-17: qty 100

## 2017-07-17 NOTE — ED Triage Notes (Signed)
Pt presents to ED for assessment of chest pain starting approx 20 minutes ago while playing softball.  Patient already sweating when CP started.  States hx of cardiac stent, states hx of stroke, leg DVT x 2.  Pt currently on plavix and xarelto and has been out since Tuesday.  Pt c/o SOB, dizziness, light-headedness, denies nausea.  C/o jaw pain and left wrist pain.

## 2017-07-17 NOTE — ED Notes (Signed)
Patient transported to CT 

## 2017-07-17 NOTE — ED Provider Notes (Signed)
[facility] EMERGENCY DEPARTMENT Provider Note   CSN: [id number] Arrival date & time: 07/17/17  1517     History   Chief Complaint Chief Complaint  Patient presents with  . Chest Pain    HPI [name] Holland is a 47 y.o. male with PMH/o DVT (May 2018), CVA, HLD, DM, NSTEMI, DM who presents for evaluation of chest pain that began 20 minutes prior to ED arrival.  Patient reports that he was playing softball at that time.  He reports that towards the end of the game, he started having a " dull pressure" patient states that the pressure radiated up to his jaw and to his left upper extremity.  Patient reports that he had associated shortness of breath, dizziness, lightheadedness sensation.  He states that he was already diaphoretic from the game.  Patient states that it felt similar to when he had his previous cardiac events.  Patient reports that now, the pain is eased but he still having pain that he rates at a 4/10.  He states that his pain is worse with deep inspiration.  Patient reports that he was recently diagnosed with a DVT in May 2018.  This was his second DVT.  He reports that he had previously been on Plavix and Xarelto but states that he has not taken in the last 4 to 5 days.  Denies any recent fever, abdominal pain, numbness/weakness of his arms or legs.  Cardiology: Nanetta Batty  The history is provided by the patient.    Past Medical History:  Diagnosis Date  . Acute deep vein thrombosis (DVT) of femoral vein (HCC)    dVT  . Arthritis    "knees" (11/23/2016)  . Clotting disorder (HCC)   . Cocaine abuse in remission (HCC) 2014  . Coronary artery disease due to lipid rich plaque   . DVT (deep venous thrombosis) (HCC) 05/2016   LLE  . 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 Hyperlipidemia   . Intracranial and intraspinal phlebitis and thrombophlebitis 05/31/2014  . Myocardial infarction (HCC)  2016   "caused by blood clot in the left side of my head"  . Stroke (HCC) 05/2014   denies residual on 11/23/2016  . Type II diabetes mellitus [facility])     Patient Active Problem List   Diagnosis Date Noted  . Chest pain 07/17/2017  . Coronary artery disease due to lipid rich plaque   . Non-ST elevation (NSTEMI) myocardial infarction (HCC)   . History of DVT (deep vein thrombosis) 11/23/2016  . 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 Long term current use of anticoagulant 06/17/2016  . Mixed hyperlipidemia 05/31/2016  . Bilateral primary osteoarthritis of knee 04/21/2015  . Cocaine abuse in remission (HCC) 04/21/2015  . Obesity (BMI 30-39.9)   . CVA (cerebral infarction) 05/31/2014  . 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 CARDIAC CATHETERIZATION  2008  . CORONARY STENT INTERVENTION N/A 11/24/2016   Procedure: CORONARY STENT INTERVENTION;  Surgeon: Kathleene Hazel, MD;  [facility]: [facility];  Service: Cardiovascular;  Laterality: N/A;  . CORONARY THROMBECTOMY N/A 11/24/2016   Procedure: Coronary Thrombectomy;  Surgeon: Kathleene Hazel, MD;  Location: [facility];  Service: Cardiovascular;  Laterality: N/A;  . HIP  SURGERY Right   . KNEE ARTHROSCOPY Bilateral early 2000s  . LEFT HEART CATH AND CORONARY ANGIOGRAPHY N/A 11/24/2016   Procedure: LEFT HEART CATH AND CORONARY ANGIOGRAPHY;  Surgeon: Kathleene Hazel, MD;  Location: [facility];  Service: Cardiovascular;  Laterality: N/A;  . WRIST SURGERY Right         Home Medications    Prior to Admission medications   Medication Sig Start Date End Date Taking? Authorizing Provider  clopidogrel (PLAVIX) 75 MG tablet TAKE 1 TABLET(75  MG) BY MOUTH DAILY 04/01/17  Yes Willow Ora, MD  MAGNESIUM PO Take 1 tablet by mouth 3 (three) times daily.   Yes [provider]  OVER THE COUNTER MEDICATION Take 1 tablet by mouth 3 (three) times daily. OTC supplement to lower cholesterol   Yes [provider]  rivaroxaban (XARELTO) 20 MG TABS tablet Take 1 tablet (20 mg total) by mouth daily with supper. Patient taking differently: Take 20 mg by mouth daily with breakfast.  12/22/16  Yes Runell Gess, MD  atorvastatin (LIPITOR) 80 MG tablet Take 1 tablet (80 mg total) by mouth daily at 6 PM. Patient not taking: Reported on 07/17/2017 05/11/17   Jodelle Gross, NP  ezetimibe (ZETIA) 10 MG tablet Take 1 tablet (10 mg total) by mouth daily. Patient not taking: Reported on 07/17/2017 05/11/17 08/09/17  Jodelle Gross, NP  nitroGLYCERIN (NITROSTAT) 0.4 MG SL tablet Place 1 tablet (0.4 mg total) under the tongue every 5 (five) minutes as needed for chest pain. Patient not taking: Reported on 07/17/2017 05/11/17 08/09/17  Jodelle Gross, NP    Family History Family History  Problem Relation Age of Onset  . Other Mother   . Diabetes type II Mother   . Asthma Mother   . Diabetes Mother   . CAD Father        CABG  . Hypertension Father   . Hyperlipidemia Father   . Pancreatic cancer Maternal Aunt   . Lung cancer Maternal Aunt   . CAD Maternal Grandmother   . Cerebral aneurysm Paternal Grandmother   . Cancer Paternal Grandfather        unknown    Social History Social History   Tobacco Use  . Smoking status: Former Smoker    Packs/day: 1.00    Years: 24.00    Pack years: 24.00    Types: Cigarettes    Last attempt to quit: 2014    Years since quitting: 5.5  . Smokeless tobacco: Current User    Types: Snuff  Substance Use Topics  . Alcohol use: No  . Drug use: No    Types: Cocaine    Comment: 11/23/2016 "tx for cocaine abuse in 2014"     Allergies   Patient has no known allergies.   Review of  Systems Review of Systems  Constitutional: Negative for fever.  Respiratory: Positive for shortness of breath. Negative for cough.   Cardiovascular: Positive for chest pain.  Gastrointestinal: Negative for abdominal pain, nausea and vomiting.  Genitourinary: Negative for dysuria and hematuria.  Neurological: Positive for dizziness and light-headedness. Negative for headaches.  All other systems reviewed and are negative.    Physical Exam Updated Vital Signs BP 103/60   Pulse 71   Temp 98.8 F (37.1 C) (Oral)   Resp (!) 24   Ht 6\' 1"  (1.854 m)   Wt 120 kg (264 lb 8.8 oz)   SpO2 92%   BMI 34.90 kg/m  Physical Exam  Constitutional: He is oriented to person, place, and time. He appears well-developed and well-nourished.  HENT:  Head: Normocephalic and atraumatic.  Mouth/Throat: Oropharynx is clear and moist and mucous membranes are normal.  Eyes: Pupils are equal, round, and reactive to light. Conjunctivae, EOM and lids are normal.  Neck: Full passive range of motion without pain.  Cardiovascular: Regular rhythm, normal heart sounds and normal pulses. Tachycardia present. Exam reveals no gallop and no friction rub.  No murmur heard. Pulses:      Radial pulses are 2+ on the right side, and 2+ on the left side.       Dorsalis pedis pulses are 2+ on the right side, and 2+ on the left side.  Pulmonary/Chest: Effort normal and breath sounds normal.  Lungs clear to auscultation bilaterally.  Symmetric chest rise.  No wheezing, rales, rhonchi.  Abdominal: Soft. Normal appearance. There is no tenderness. There is no rigidity and no guarding.  Musculoskeletal: Normal range of motion.  Neurological: He is alert and oriented to person, place, and time.  Skin: Skin is warm and dry. Capillary refill takes less than 2 seconds.  Psychiatric: He has a normal mood and affect. His speech is normal.  Nursing note and vitals reviewed.    ED Treatments / Results  Labs (all labs ordered are  listed, but only abnormal results are displayed) Labs Reviewed  BASIC METABOLIC PANEL - Abnormal; Notable for the following components:      Result Value   Glucose, Bld 131 (*)    All other components within normal limits  CBC  HEPARIN LEVEL (UNFRACTIONATED)  CBC  APTT  I-STAT TROPONIN, ED    EKG EKG Interpretation  Date/Time:  Sunday July 17 2017 15:18:52 EDT Ventricular Rate:  101 PR Interval:  138 QRS Duration: 92 QT Interval:  322 QTC Calculation: 417 R Axis:   32 Text Interpretation:  Sinus tachycardia Otherwise normal ECG Confirmed by Bethann Berkshire [phone]) on 07/17/2017 4:25:32 PM   Radiology Dg Chest 2 View  Result Date: 07/17/2017 CLINICAL DATA:  Chest pain EXAM: CHEST - 2 VIEW COMPARISON:  05/06/2017 chest radiograph. FINDINGS: Stable cardiomediastinal silhouette with normal heart size. No pneumothorax. No pleural effusion. Lungs appear clear, with no acute consolidative airspace disease and no pulmonary edema. IMPRESSION: No active cardiopulmonary disease. Electronically Signed   By: Delbert Phenix M.D.   On: 07/17/2017 15:53   Ct Angio Chest Pe W And/or Wo Contrast  Result Date: 07/17/2017 CLINICAL DATA:  Chest pain. EXAM: CT ANGIOGRAPHY CHEST WITH CONTRAST TECHNIQUE: Multidetector CT imaging of the chest was performed using the standard protocol during bolus administration of intravenous contrast. Multiplanar CT image reconstructions and MIPs were obtained to evaluate the vascular anatomy. CONTRAST:  ISOVUE-370 IOPAMIDOL (ISOVUE-370) INJECTION 76% COMPARISON:  None. FINDINGS: Cardiovascular: Heart is enlarged. No pericardial effusion. Coronary stent device noted. No filling defect within the opacified pulmonary arteries to suggest the presence of an acute pulmonary embolus. Mediastinum/Nodes: No mediastinal lymphadenopathy. There is no hilar lymphadenopathy. The esophagus has normal imaging features. There is no axillary lymphadenopathy. Lungs/Pleura: The central  tracheobronchial airways are patent. No focal airspace consolidation. No pulmonary edema or pleural effusion. Upper Abdomen: The liver shows diffusely decreased attenuation suggesting steatosis. Musculoskeletal: No worrisome lytic or sclerotic osseous abnormality. Review of the MIP images confirms the above findings. IMPRESSION: 1. No CT evidence for acute pulmonary embolus. 2. Cardiomegaly. 3. Hepatic steatosis. Electronically Signed   By: Kennith Center M.D.   On:  07/17/2017 18:38    Procedures Procedures (including critical care time)  Medications Ordered in ED Medications  nitroGLYCERIN (NITROSTAT) SL tablet 0.4 mg (0.4 mg Sublingual Given 07/17/17 1740)  sodium chloride 0.9 % bolus 1,000 mL (1,000 mLs Intravenous New Bag/Given 07/17/17 1922)  heparin bolus via infusion 4,000 Units (has no administration in time range)  heparin ADULT infusion 100 units/mL (25000 units/231mL sodium chloride 0.45%) (has no administration in time range)  aspirin chewable tablet 324 mg (324 mg Oral Given 07/17/17 1739)  iopamidol (ISOVUE-370) 76 % injection 100 mL (100 mLs Intravenous Contrast Given 07/17/17 1811)     Initial Impression / Assessment and Plan / ED Course  I have reviewed the triage vital signs and the nursing notes.  Pertinent labs & imaging results that were available during my care of the patient were reviewed by me and considered in my medical decision making (see chart for details).     47 year old male who presents for evaluation of chest pain that began while playing softball.  History of an STEMI, with stent placement.  Also recent history of DVT in May 2018 was supposed to be on Xarelto and Plavix but states he has not been taking the last several days.  Currently reports pain 4-10. Patient is afebrile, non-toxic appearing, sitting comfortably on examination table. Vital signs reviewed and stable.  Equal pulses in all 4 extremities.  Consider ACS etiology versus infectious etiology.  Also  consider PE given story of DVT and lack of anticoagulation and the fact the patient is tachycardic on exam.  He is currently still having pain at 4/10.  We will plan to give ASA and nitro and reevaluate.  Chest x-ray negative for any acute infectious abnormalities.  BMP shows slight hyperglycemia.  Otherwise unremarkable.  Troponin is negative.  CBC is unremarkable.  Review of records show the patient had a cardiac catheterization in November 2018.  At that time, patient was noted to have severe distal filling defect in the proximal LAD that appeared to be a thrombus in the site of likely ruptured plaque.  He did have a stent placed.  Reevaluation.  Patient states that after the aspirin and nitro, his pain has resolved.  CTA of chest shows no evidence of PE.  Given patient's history and risk factors he has a HEART score of 4.   Discussed patient with Dr. Charlestine Night (Cardiology).  Agrees that patient is high risk given his history.  We will plan for observation admission for further evaluation.   Final Clinical Impressions(s) / ED Diagnoses   Final diagnoses:  Unstable angina [facility])  Other chest pain    ED Discharge Orders    None       Rosana Hoes 07/17/17 2012    Bethann Berkshire, MD 07/19/17 1032

## 2017-07-17 NOTE — Progress Notes (Signed)
ANTICOAGULATION CONSULT NOTE - Initial Consult  Pharmacy Consult for heparin Indication: chest pain/ACS  No Known Allergies  Patient Measurements: Height: 6\' 1"  (185.4 cm) Weight: 264 lb 8.8 oz (120 kg) IBW/kg (Calculated) : 79.9 Heparin Dosing Weight: 105 kg  Vital Signs: Temp: 98.8 F (37.1 C) (07/07 1521) Temp Source: Oral (07/07 1521) BP: 103/60 (07/07 1800) Pulse Rate: 71 (07/07 1800)  Labs: Recent Labs    07/17/17 1537  HGB 15.9  HCT 50.5  PLT 214  CREATININE 1.10    Estimated Creatinine Clearance: 113.8 mL/min (by C-G formula based on SCr of 1.1 mg/dL).  Assessment: CC/HPI: 47 yo m presenting with CP - out of xarelto since tues  PMH: hx of dvt x 2, hx of stroke, hx of cad  Anticoag: rivoxaban pta for hx of dvt - last dose 7/2 Now hep for r/o acs   Renal: SCr 1.1  Heme/Onc: H&H 15.9/50.5, Plt 214  Goal of Therapy:  Heparin level 0.3-0.7 units/ml Monitor platelets by anticoagulation protocol: Yes   Plan:  Heparin bolus 4000 units x 1 Heparin gtt 1400 units/hr Daily hep lvl cbc (will add on aptt just in case) F/u cards plans   Isaac Bliss, PharmD, BCPS, BCCCP Clinical Pharmacist [phone]  Please check AMION for all [facility] numbers  07/17/2017 7:39 PM

## 2017-07-17 NOTE — H&P (Signed)
History and Physical  Primary Cardiologist:Berry PCP: Leamon Arnt, MD  Chief Complaint: Chest Pain  HPI: This is a 47 year old man with history of coronary artery disease status post proximal LAD stenting in 2018 for high-grade proximal LAD lesion, history of clotting disorder and venous thrombosis x2 (cerebral and lower extremity), diabetes, hyperlipidemia who presents with chest pain.  He was in his usual state of health when he was at a softball game.  Between innings, he developed severe substernal chest pain.  He had been exerting himself throughout the day somewhat and was hot but he regularly works out at higher intensity levels without symptoms.  It is not normal for him to have chest pain.  His symptoms lasted for 4 hours when they resolved after taking 4 aspirin and sublingual nitroglycerin in the [facility] emergency room.  Of note, he has not been taking his Xarelto or clopidogrel for at least 4 days having run out.  Before this, he states that he would take them very regularly.  On interview, no active chest pain currently.  No other positive review of systems including bleeding, shortness of breath, lower extremity swelling or pain, other edema, or any other concerning signs or symptoms.  In the emergency room, EKG benign, first troponin is negative and CT chest negative for acute pulmonary embolism.  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  He used cocaine for nearly a decade and has been sober for 5 years.  He currently works as a Social worker for recovering addicts.  No smoke tobacco but does use snuff.  Prior Cardiac Studies:  Past Medical History:  Diagnosis Date  . Acute deep vein thrombosis (DVT) of femoral vein (HCC)    dVT  . Arthritis    "knees" (11/23/2016)  . Clotting disorder ([place])   . 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 Coronary artery disease due to lipid rich plaque   . DVT (deep  venous thrombosis) ([place]) 05/2016   LLE  . 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 Hyperlipidemia   . Intracranial and intraspinal phlebitis and thrombophlebitis 05/31/2014  . Myocardial infarction ([place]) 2016   "caused by blood clot in the left side of my head"  . Stroke ([place]) 05/2014   denies residual on 11/23/2016  . Type II diabetes mellitus ([place])     Past Surgical History:  Procedure Laterality Date  . CARDIAC CATHETERIZATION  2008  . CORONARY STENT INTERVENTION N/A 11/24/2016   Procedure: CORONARY STENT INTERVENTION;  Surgeon: Burnell Blanks, MD;  Location: [place] [facility];  Service: Cardiovascular;  Laterality: N/A;  . CORONARY THROMBECTOMY N/A 11/24/2016   Procedure: Coronary Thrombectomy;  Surgeon: Burnell Blanks, MD;  Location: [place] CV LAB;  Service: Cardiovascular;  Laterality: N/A;  . HIP SURGERY Right   . KNEE ARTHROSCOPY Bilateral early 2000s  . LEFT HEART CATH AND CORONARY ANGIOGRAPHY N/A 11/24/2016   Procedure: LEFT HEART CATH AND CORONARY ANGIOGRAPHY;  Surgeon: Burnell Blanks, MD;  Location: [place] [facility] LAB;  Service: Cardiovascular;  Laterality: N/A;  . WRIST SURGERY Right     Family History  Problem Relation Age of Onset  . Other Mother   . Diabetes type II Mother   . Asthma Mother   . Diabetes Mother   . CAD Father        CABG  . Hypertension Father   . Hyperlipidemia Father   .  Pancreatic cancer Maternal Aunt   . Lung cancer Maternal Aunt   . CAD Maternal Grandmother   . Cerebral aneurysm Paternal Grandmother   . Cancer Paternal Grandfather        unknown   Social History:  reports that he quit smoking about 5 years ago. His smoking use included cigarettes. He has a 24.00 pack-year smoking history. His smokeless tobacco use includes snuff. He reports that he does not drink alcohol or use drugs.  Allergies: No Known Allergies  No current  facility-administered medications on file prior to encounter.    Current Outpatient Medications on File Prior to Encounter  Medication Sig Dispense Refill  . atorvastatin (LIPITOR) 80 MG tablet Take 1 tablet (80 mg total) by mouth daily at 6 PM. 90 tablet 3  . clopidogrel (PLAVIX) 75 MG tablet TAKE 1 TABLET(75 MG) BY MOUTH DAILY 90 tablet 3  . ezetimibe (ZETIA) 10 MG tablet Take 1 tablet (10 mg total) by mouth daily. 90 tablet 3  . nitroGLYCERIN (NITROSTAT) 0.4 MG SL tablet Place 1 tablet (0.4 mg total) under the tongue every 5 (five) minutes as needed for chest pain. 90 tablet 3  . rivaroxaban (XARELTO) 20 MG TABS tablet Take 1 tablet (20 mg total) by mouth daily with supper. 90 tablet 1    Results for orders placed or performed during the hospital encounter of 07/17/17 (from the past 48 hour(s))  Basic metabolic panel     Status: Abnormal   Collection Time: 07/17/17  3:37 PM  Result Value Ref Range   Sodium 142 135 - 145 mmol/L   Potassium 3.9 3.5 - 5.1 mmol/L   Chloride 109 98 - 111 mmol/L    Comment: Please note change in reference range.   CO2 25 22 - 32 mmol/L   Glucose, Bld 131 (H) 70 - 99 mg/dL    Comment: Please note change in reference range.   BUN 16 6 - 20 mg/dL    Comment: Please note change in reference range.   Creatinine, Ser 1.10 0.61 - 1.24 mg/dL   Calcium 9.7 8.9 - 10.3 mg/dL   GFR calc non Af Amer >60 >60 mL/min   GFR calc Af Amer >60 >60 mL/min    Comment: (NOTE) The eGFR has been calculated using the CKD EPI equation. This calculation has not been validated in all clinical situations. eGFR's persistently <60 mL/min signify possible Chronic Kidney Disease.    Anion gap 8 5 - 15    Comment: Performed at [facility] [address], [place] [address]  CBC     Status: None   Collection Time: 07/17/17  3:37 PM  Result Value Ref Range   WBC 8.9 4.0 - 10.5 K/uL   RBC 5.67 4.22 - 5.81 MIL/uL   Hemoglobin 15.9 13.0 - 17.0 g/dL   HCT 50.5 39.0 -  52.0 %   MCV 89.1 78.0 - 100.0 fL   MCH 28.0 26.0 - 34.0 pg   MCHC 31.5 30.0 - 36.0 g/dL   RDW 15.4 11.5 - 15.5 %   Platelets 214 150 - 400 K/uL    Comment: Performed at [facility] [address] [place], [place] [address]  I-stat troponin, ED     Status: None   Collection Time: 07/17/17  3:44 PM  Result Value Ref Range   Troponin i, poc 0.00 0.00 - 0.08 ng/mL   Comment 3  Comment: Due to the release kinetics of cTnI, a negative result within the first hours of the onset of symptoms does not rule out myocardial infarction with certainty. If myocardial infarction is still suspected, repeat the test at appropriate intervals.    Dg Chest 2 View  Result Date: 07/17/2017 CLINICAL DATA:  Chest pain EXAM: CHEST - 2 VIEW COMPARISON:  05/06/2017 chest radiograph. FINDINGS: Stable cardiomediastinal silhouette with normal heart size. No pneumothorax. No pleural effusion. Lungs appear clear, with no acute consolidative airspace disease and no pulmonary edema. IMPRESSION: No active cardiopulmonary disease. Electronically Signed   By: Ilona Sorrel M.D.   On: 07/17/2017 15:53   Ct Angio Chest Pe W And/or Wo Contrast  Result Date: 07/17/2017 CLINICAL DATA:  Chest pain. EXAM: CT ANGIOGRAPHY CHEST WITH CONTRAST TECHNIQUE: Multidetector CT imaging of the chest was performed using the standard protocol during bolus administration of intravenous contrast. Multiplanar CT image reconstructions and MIPs were obtained to evaluate the vascular anatomy. CONTRAST:  13m ISOVUE-370 IOPAMIDOL (ISOVUE-370) INJECTION 76% COMPARISON:  None. FINDINGS: Cardiovascular: Heart is enlarged. No pericardial effusion. Coronary stent device noted. No filling defect within the opacified pulmonary arteries to suggest the presence of an acute pulmonary embolus. Mediastinum/Nodes: No mediastinal lymphadenopathy. There is no hilar lymphadenopathy. The esophagus has normal imaging features. There is no axillary  lymphadenopathy. Lungs/Pleura: The central tracheobronchial airways are patent. No focal airspace consolidation. No pulmonary edema or pleural effusion. Upper Abdomen: The liver shows diffusely decreased attenuation suggesting steatosis. Musculoskeletal: No worrisome lytic or sclerotic osseous abnormality. Review of the MIP images confirms the above findings. IMPRESSION: 1. No CT evidence for acute pulmonary embolus. 2. Cardiomegaly. 3. Hepatic steatosis. Electronically Signed   By: EMisty StanleyM.D.   On: 07/17/2017 18:38    ECG/Tele: sinus tachycardia no ischemic changes  ROS: As above. Otherwise, review of systems is negative unless per above HPI  Vitals:   07/17/17 1521 07/17/17 1743 07/17/17 1800 07/17/17 1900  BP: 134/74 (!) 97/59 103/60   Pulse: (!) 101 86 71   Resp: 18 (!) 23 (!) 24   Temp: 98.8 F (37.1 C)     TempSrc: Oral     SpO2: 97% 97% 92%   Weight:    120 kg (264 lb 8.8 oz)  Height:    6' 1"  (1.854 m)   Wt Readings from Last 10 Encounters:  07/17/17 120 kg (264 lb 8.8 oz)  05/11/17 123.7 kg (272 lb 9.6 oz)  05/06/17 120.2 kg (265 lb)  05/06/17 120.2 kg (265 lb)  03/07/17 125.9 kg (277 lb 9.6 oz)  12/08/16 133.8 kg (295 lb)  12/07/16 134.7 kg (297 lb)  11/25/16 135.4 kg (298 lb 8.1 oz)  08/31/16 134.8 kg (297 lb 3.2 oz)  06/01/16 (!) 156.5 kg (345 lb)    PE:  General: No acute distress HEENT: Atraumatic, EOMI, mucous membranes moist. No JVD at 45 degrees. No HJR. CV: RRR no murmurs, gallops.  Respiratory: Clear, no crackles. Normal work of breathing ABD: Non-distended and non-tender. No palpable organomegaly.  Extremities: 2+ radial pulses bilaterally. 0 edema. 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  Also with history of DVT.  CT scan negative for PE.  He has been off his Plavix and Xarelto for about 4  days.  Given this as well as his high pretest probability, will admit him observation status to a telemetry bed and cycle troponins and observe on telemetry.  We will hold Xarelto and start heparin in case there is a procedure.  Chest pain - S/P 325 ASA, IV UF Heparin - Continue 81 mg ASA qd and heparin for now, likely return to xarelto/clopidogrel at D/C - Tele bed - NPO q midnight as a precaution; timing of ischemic evaluation pending clinical course - ? Beta Blocker-- not taking at home - Lipitor for HLD (has zetia as a home med but not taking)  Full Code  Lolita Cram Maxime Beckner  MD 07/17/2017, 7:37 PM

## 2017-07-18 ENCOUNTER — Observation Stay (HOSPITAL_BASED_OUTPATIENT_CLINIC_OR_DEPARTMENT_OTHER): Payer: BLUE CROSS/BLUE SHIELD

## 2017-07-18 ENCOUNTER — Encounter (HOSPITAL_COMMUNITY)
Admission: EM | Disposition: A | Discharge status: Home / Self Care | Payer: Self-pay | Source: Home / Self Care | Attending: Emergency Medicine

## 2017-07-18 DIAGNOSIS — I5030 Unspecified diastolic (congestive) heart failure: Secondary | ICD-10-CM | POA: Diagnosis not present

## 2017-07-18 DIAGNOSIS — D6859 Other primary thrombophilia: Secondary | ICD-10-CM | POA: Diagnosis not present

## 2017-07-18 DIAGNOSIS — I25110 Atherosclerotic heart disease of native coronary artery with unstable angina pectoris: Secondary | ICD-10-CM | POA: Diagnosis not present

## 2017-07-18 DIAGNOSIS — I200 Unstable angina: Secondary | ICD-10-CM

## 2017-07-18 DIAGNOSIS — E119 Type 2 diabetes mellitus without complications: Secondary | ICD-10-CM | POA: Diagnosis not present

## 2017-07-18 DIAGNOSIS — Z955 Presence of coronary angioplasty implant and graft: Secondary | ICD-10-CM | POA: Diagnosis not present

## 2017-07-18 HISTORY — PX: LEFT HEART CATH AND CORONARY ANGIOGRAPHY: CATH118249

## 2017-07-18 LAB — CBC WITH DIFFERENTIAL/PLATELET
Abs Immature Granulocytes: 0 10*3/uL (ref 0.0–0.1)
BASOS PCT: 1 %
Basophils Absolute: 0.1 10*3/uL (ref 0.0–0.1)
EOS ABS: 0.4 10*3/uL (ref 0.0–0.7)
EOS PCT: 6 %
HCT: 48 % (ref 39.0–52.0)
Hemoglobin: 14.8 g/dL (ref 13.0–17.0)
Immature Granulocytes: 0 %
Lymphocytes Relative: 40 %
Lymphs Abs: 2.7 10*3/uL (ref 0.7–4.0)
MCH: 27.5 pg (ref 26.0–34.0)
MCHC: 30.8 g/dL (ref 30.0–36.0)
MCV: 89.2 fL (ref 78.0–100.0)
MONO ABS: 0.6 10*3/uL (ref 0.1–1.0)
MONOS PCT: 8 %
Neutro Abs: 3.1 10*3/uL (ref 1.7–7.7)
Neutrophils Relative %: 45 %
PLATELETS: 178 10*3/uL (ref 150–400)
RBC: 5.38 MIL/uL (ref 4.22–5.81)
RDW: 15.7 % — AB (ref 11.5–15.5)
WBC: 6.9 10*3/uL (ref 4.0–10.5)

## 2017-07-18 LAB — BASIC METABOLIC PANEL
ANION GAP: 8 (ref 5–15)
BUN: 15 mg/dL (ref 6–20)
CALCIUM: 8.4 mg/dL — AB (ref 8.9–10.3)
CO2: 24 mmol/L (ref 22–32)
CREATININE: 0.98 mg/dL (ref 0.61–1.24)
Chloride: 109 mmol/L (ref 98–111)
GFR calc non Af Amer: >60 mL/min (ref >60)
Glucose, Bld: 118 mg/dL — ABNORMAL HIGH (ref 70–99)
Potassium: 3.7 mmol/L (ref 3.5–5.1)
SODIUM: 141 mmol/L (ref 135–145)

## 2017-07-18 LAB — POCT ACTIVATED CLOTTING TIME: Activated Clotting Time: 103 seconds

## 2017-07-18 LAB — ECHOCARDIOGRAM COMPLETE
Height: 73 in
Weight: 4627.2 oz

## 2017-07-18 LAB — CBC
HCT: 46.9 % (ref 39.0–52.0)
Hemoglobin: 14.7 g/dL (ref 13.0–17.0)
MCH: 28.1 pg (ref 26.0–34.0)
MCHC: 31.3 g/dL (ref 30.0–36.0)
MCV: 89.5 fL (ref 78.0–100.0)
PLATELETS: 198 10*3/uL (ref 150–400)
RBC: 5.24 MIL/uL (ref 4.22–5.81)
RDW: 15.6 % — AB (ref 11.5–15.5)
WBC: 8.7 10*3/uL (ref 4.0–10.5)

## 2017-07-18 LAB — GLUCOSE, CAPILLARY
GLUCOSE-CAPILLARY: 67 mg/dL — AB (ref 70–99)
GLUCOSE-CAPILLARY: 116 mg/dL — AB (ref 70–99)
Glucose-Capillary: 63 mg/dL — ABNORMAL LOW (ref 70–99)

## 2017-07-18 LAB — TROPONIN I

## 2017-07-18 LAB — APTT: aPTT: 35 seconds (ref 24–36)

## 2017-07-18 LAB — HEPARIN LEVEL (UNFRACTIONATED): HEPARIN UNFRACTIONATED: 0.23 [IU]/mL — AB (ref 0.30–0.70)

## 2017-07-18 LAB — HIV ANTIBODY (ROUTINE TESTING W REFLEX): HIV SCREEN 4TH GENERATION: NONREACTIVE

## 2017-07-18 SURGERY — LEFT HEART CATH AND CORONARY ANGIOGRAPHY
Anesthesia: LOCAL

## 2017-07-18 MED ORDER — SODIUM CHLORIDE 0.9% FLUSH: 3.0000 mL | Freq: Two times a day (BID) | INTRAVENOUS | Status: DC

## 2017-07-18 MED ORDER — CLOPIDOGREL BISULFATE 75 MG PO TABS
75.0000 mg | ORAL_TABLET | Freq: Every day | ORAL | Status: DC
  Administered 2017-07-19: 75 mg via ORAL
  Filled 2017-07-18: qty 1

## 2017-07-18 MED ORDER — ISOSORBIDE MONONITRATE ER 30 MG PO TB24
15.0000 mg | ORAL_TABLET | Freq: Every day | ORAL | Status: DC
  Administered 2017-07-18 – 2017-07-19 (×2): 15 mg via ORAL
  Filled 2017-07-18 (×2): qty 1

## 2017-07-18 MED ORDER — SODIUM CHLORIDE 0.9% FLUSH: 3.0000 mL | INTRAVENOUS | Status: DC | PRN

## 2017-07-18 MED ORDER — IOHEXOL 350 MG/ML SOLN
INTRAVENOUS | Status: DC | PRN
  Administered 2017-07-18: 60 mL via INTRA_ARTERIAL

## 2017-07-18 MED ORDER — LIDOCAINE HCL (PF) 1 % IJ SOLN
INTRAMUSCULAR | Status: DC | PRN
  Administered 2017-07-18: 15 mL via INTRADERMAL

## 2017-07-18 MED ORDER — SODIUM CHLORIDE 0.9 % WEIGHT BASED INFUSION: 1.0000 mL/kg/h | INTRAVENOUS | Status: DC

## 2017-07-18 MED ORDER — ASPIRIN 81 MG PO CHEW: 81.0000 mg | CHEWABLE_TABLET | ORAL | Status: DC

## 2017-07-18 MED ORDER — MIDAZOLAM HCL 2 MG/2ML IJ SOLN
INTRAMUSCULAR | Status: AC
  Filled 2017-07-18: qty 2

## 2017-07-18 MED ORDER — HEPARIN (PORCINE) IN NACL 1000-0.9 UT/500ML-% IV SOLN
INTRAVENOUS | Status: AC
  Filled 2017-07-18: qty 1000

## 2017-07-18 MED ORDER — MIDAZOLAM HCL 2 MG/2ML IJ SOLN
INTRAMUSCULAR | Status: DC | PRN
  Administered 2017-07-18: 1 mg via INTRAVENOUS

## 2017-07-18 MED ORDER — SODIUM CHLORIDE 0.9 % IV SOLN: INTRAVENOUS | Status: AC

## 2017-07-18 MED ORDER — FENTANYL CITRATE (PF) 100 MCG/2ML IJ SOLN
INTRAMUSCULAR | Status: AC
  Filled 2017-07-18: qty 2

## 2017-07-18 MED ORDER — SODIUM CHLORIDE 0.9 % WEIGHT BASED INFUSION
3.0000 mL/kg/h | INTRAVENOUS | Status: DC
  Administered 2017-07-18: 3 mL/kg/h via INTRAVENOUS

## 2017-07-18 MED ORDER — HEPARIN (PORCINE) IN NACL 100-0.45 UNIT/ML-% IJ SOLN: 1750.0000 [IU]/h | INTRAMUSCULAR | Status: DC

## 2017-07-18 MED ORDER — SODIUM CHLORIDE 0.9% FLUSH
3.0000 mL | Freq: Two times a day (BID) | INTRAVENOUS | Status: DC
  Administered 2017-07-18: 3 mL via INTRAVENOUS

## 2017-07-18 MED ORDER — CLOPIDOGREL BISULFATE 75 MG PO TABS
300.0000 mg | ORAL_TABLET | Freq: Once | ORAL | Status: AC
  Administered 2017-07-18: 300 mg via ORAL
  Filled 2017-07-18: qty 4

## 2017-07-18 MED ORDER — ASPIRIN 81 MG PO CHEW: 81.0000 mg | CHEWABLE_TABLET | ORAL | Status: AC

## 2017-07-18 MED ORDER — DEXTROSE 50 % IV SOLN
INTRAVENOUS | Status: AC
  Filled 2017-07-18: qty 50

## 2017-07-18 MED ORDER — FENTANYL CITRATE (PF) 100 MCG/2ML IJ SOLN
INTRAMUSCULAR | Status: DC | PRN
  Administered 2017-07-18: 50 ug via INTRAVENOUS

## 2017-07-18 MED ORDER — DEXTROSE 50 % IV SOLN
INTRAVENOUS | Status: DC | PRN
  Administered 2017-07-18: .5 via INTRAVENOUS

## 2017-07-18 MED ORDER — SODIUM CHLORIDE 0.9 % IV SOLN: 250.0000 mL | INTRAVENOUS | Status: DC | PRN

## 2017-07-18 MED ORDER — LIDOCAINE HCL (PF) 1 % IJ SOLN
INTRAMUSCULAR | Status: AC
  Filled 2017-07-18: qty 30

## 2017-07-18 MED ORDER — HEPARIN (PORCINE) IN NACL 2-0.9 UNITS/ML
INTRAMUSCULAR | Status: AC | PRN
  Administered 2017-07-18 (×2): 500 mL via INTRA_ARTERIAL

## 2017-07-18 MED ORDER — SODIUM CHLORIDE 0.9 % IV SOLN
250.0000 mL | INTRAVENOUS | Status: DC | PRN
  Administered 2017-07-19: 250 mL via INTRAVENOUS

## 2017-07-18 SURGICAL SUPPLY — 11 items
CATH INFINITI 5FR MULTPACK ANG (CATHETERS) ×1 IMPLANT
COVER PRB 48X5XTLSCP FOLD TPE (BAG) IMPLANT
COVER PROBE 5X48 (BAG) ×2
KIT HEART LEFT (KITS) ×2 IMPLANT
KIT MICROPUNCTURE NIT STIFF (SHEATH) ×1 IMPLANT
PACK CARDIAC CATHETERIZATION (CUSTOM PROCEDURE TRAY) ×2 IMPLANT
SHEATH PINNACLE 5F 10CM (SHEATH) ×1 IMPLANT
SYR MEDRAD MARK V 150ML (SYRINGE) ×2 IMPLANT
TRANSDUCER W/STOPCOCK (MISCELLANEOUS) ×2 IMPLANT
TUBING CIL FLEX 10 FLL-RA (TUBING) ×2 IMPLANT
WIRE EMERALD 3MM-J .035X150CM (WIRE) ×1 IMPLANT

## 2017-07-18 NOTE — Interval H&P Note (Signed)
History and Physical Interval Note:  07/18/2017 4:34 PM  [name] Holland  has presented today for cardiac catheterization, with the diagnosis of unstable angina. The various methods of treatment have been discussed with the patient and family. After consideration of risks, benefits and other options for treatment, the patient has consented to  Procedure(s): LEFT HEART CATH AND CORONARY ANGIOGRAPHY (N/A) as a surgical intervention .  The patient's history has been reviewed, patient examined, no change in status, stable for surgery.  I have reviewed the patient's chart and labs.  Questions were answered to the patient's satisfaction.    Cath Lab Visit (complete for each Cath Lab visit)  Clinical Evaluation Leading to the Procedure:   ACS: Yes.    Non-ACS:  N/A  [name]

## 2017-07-18 NOTE — Progress Notes (Signed)
ANTICOAGULATION CONSULT NOTE   Pharmacy Consult for Heparin (Xarelto on hold) Indication: chest pain/ACS, Hx DVT  No Known Allergies  Patient Measurements: Height: 6\' 1"  (185.4 cm) Weight: 289 lb 3.2 oz (131.2 kg) IBW/kg (Calculated) : 79.9 Heparin Dosing Weight: 105 kg  Vital Signs: Temp: 97.8 F (36.6 C) (07/08 0522) Temp Source: Oral (07/08 0522) BP: 105/64 (07/08 0522) Pulse Rate: 75 (07/08 0522)  Labs: Recent Labs    07/17/17 1537 07/17/17 2057 07/18/17 0356  HGB 15.9  --  14.7  HCT 50.5  --  46.9  PLT 214  --  198  APTT  --   --  35  HEPARINUNFRC  --   --  0.23*  CREATININE 1.10  --  0.98  TROPONINI  --  0.03* <0.03    Estimated Creatinine Clearance: 133.8 mL/min (by C-G formula based on SCr of 0.98 mg/dL).  Assessment: CC/HPI: 47 yo m presenting with CP - out of xarelto since tues  PMH: hx of dvt x 2, hx of stroke, hx of cad  Anticoag: rivoxaban pta for hx of dvt - last dose 7/2 Now hep for r/o acs   Renal: SCr 1.1  Heme/Onc: H&H 15.9/50.5, Plt 214  7/8: heparin level is low at 0.23, ok to use heparin level only to dose, off Xarelto for almost 1 week  Goal of Therapy:  Heparin level 0.3-0.7 units/ml Monitor platelets by anticoagulation protocol: Yes   Plan:  Inc heparin to 1650 units/hr  1400 HL F/u cards plans   Abran Duke, PharmD, BCPS Clinical Pharmacist Phone: [phone]

## 2017-07-18 NOTE — Progress Notes (Signed)
ANTICOAGULATION CONSULT NOTE   Pharmacy Consult for Heparin (Xarelto on hold) Indication: chest pain/ACS, Hx DVT  No Known Allergies  Patient Measurements: Height: 6\' 1"  (185.4 cm) Weight: 289 lb 3.2 oz (131.2 kg) IBW/kg (Calculated) : 79.9 Heparin Dosing Weight: 105 kg  Vital Signs:    Labs: Recent Labs    07/17/17 1537 07/17/17 2057 07/18/17 0356 07/18/17 0902  HGB 15.9  --  14.7 14.8  HCT 50.5  --  46.9 48.0  PLT 214  --  198 178  APTT  --   --  35  --   HEPARINUNFRC  --   --  0.23*  --   CREATININE 1.10  --  0.98  --   TROPONINI  --  0.03* <0.03 <0.03    Estimated Creatinine Clearance: 133.8 mL/min (by C-G formula based on SCr of 0.98 mg/dL).  Assessment: CC/HPI: 47 yo m presenting with CP - out of xarelto since tues  PMH: hx of dvt x 2, hx of stroke, hx of cad  Anticoag: rivoxaban pta for hx of dvt - last dose 7/2 Now hep for r/o acs   Renal: SCr 1.1  Heme/Onc: H&H 15.9/50.5, Plt 214  PM f/u > now s/p cath, pharmacy asked to resume heparin 8 hrs after sheath out, removed~ 1745 pm.  planning to restart Xarelto likely tomorrow.  Sheath removed   Goal of Therapy:  Heparin level 0.3-0.7 units/ml Monitor platelets by anticoagulation protocol: Yes   Plan:  Restart heparin at 1650 units/hr tomorrow at 0200 AM. Check heparin level 6 hrs after gtt resumed. Daily heparin level and CBC. F/u plans to resume Xarelto tomorrow.  Jenetta Downer, [employer] Clinical Pharmacist Phone [phone]  07/18/2017 6:34 PM

## 2017-07-18 NOTE — Progress Notes (Signed)
Echocardiogram 2D Echocardiogram has been performed.  07/18/2017, 2:21 PM

## 2017-07-18 NOTE — Progress Notes (Signed)
Progress Note  Patient Name: [name] Holland Date of Encounter: 07/18/2017  Primary Cardiologist: Allyson Sabal   Subjective   47 year old gentleman with a history of coronary artery disease-status post stenting of his LAD in 2018.  He has a history of hypercoagulability. Playing softball this weekend.  He developed some substernal chest pain the pain lasted for about 4 hours and he was brought to [facility].  He has not been taking his Xarelto or his Plavix for the past 4 days.  He apparently ran out of medications. In the emergency room, CT angiogram of the chest was negative for pulmonary embolus.  He states that the pain is very similar to his previous episodes of angina prior to his stenting.  Inpatient Medications    Scheduled Meds: . aspirin EC  81 mg Oral Daily  . atorvastatin  80 mg Oral q1800   Continuous Infusions: . heparin 1,650 Units/hr (07/18/17 0947)   PRN Meds: acetaminophen, nitroGLYCERIN, ondansetron (ZOFRAN) IV, zolpidem   Vital Signs    Vitals:   07/17/17 1900 07/17/17 2039 07/18/17 0522 07/18/17 0523  BP:  119/68 105/64   Pulse:  60 75   Resp:   20   Temp:  98.3 F (36.8 C) 97.8 F (36.6 C)   TempSrc:  Oral Oral   SpO2:  97% 95%   Weight: 264 lb 8.8 oz (120 kg)   289 lb 3.2 oz (131.2 kg)  Height: 6\' 1"  (1.854 m)       Intake/Output Summary (Last 24 hours) at 07/18/2017 1014 Last data filed at 07/18/2017 0700 Gross per 24 hour  Intake 768 ml  Output -  Net 768 ml   Filed Weights   07/17/17 1900 07/18/17 0523  Weight: 264 lb 8.8 oz (120 kg) 289 lb 3.2 oz (131.2 kg)    Telemetry     NSR  - Personally Reviewed  ECG     NSR , Inf. Q waves.  - Personally Reviewed  Physical Exam   GEN:  Middle-aged gentleman, moderately obese. Neck: No JVD Cardiac: RRR, no murmurs, rubs, or gallops.  Respiratory: Clear to auscultation bilaterally. GI: Soft, nontender, non-distended  MS: No edema; No deformity.   Good right radial pulses. Neuro:   Nonfocal  Psych: Normal affect   Labs    Chemistry Recent Labs  Lab 07/17/17 1537 07/18/17 0356  NA 142 141  K 3.9 3.7  CL 109 109  CO2 25 24  GLUCOSE 131* 118*  BUN 16 15  CREATININE 1.10 0.98  CALCIUM 9.7 8.4*  GFRNONAA >60 >60  GFRAA >60 >60  ANIONGAP 8 8     Hematology Recent Labs  Lab 07/17/17 1537 07/18/17 0356 07/18/17 0902  WBC 8.9 8.7 6.9  RBC 5.67 5.24 5.38  HGB 15.9 14.7 14.8  HCT 50.5 46.9 48.0  MCV 89.1 89.5 89.2  MCH 28.0 28.1 27.5  MCHC 31.5 31.3 30.8  RDW 15.4 15.6* 15.7*  PLT 214 198 178    Cardiac Enzymes Recent Labs  Lab 07/17/17 2057 07/18/17 0356  TROPONINI 0.03* <0.03    Recent Labs  Lab 07/17/17 1544  TROPIPOC 0.00     BNPNo results for input(s): BNP, PROBNP in the last 168 hours.   DDimer No results for input(s): DDIMER in the last 168 hours.   Radiology    Dg Chest 2 View  Result Date: 07/17/2017 CLINICAL DATA:  Chest pain EXAM: CHEST - 2 VIEW COMPARISON:  05/06/2017 chest radiograph. FINDINGS: Stable cardiomediastinal silhouette  with normal heart size. No pneumothorax. No pleural effusion. Lungs appear clear, with no acute consolidative airspace disease and no pulmonary edema. IMPRESSION: No active cardiopulmonary disease. Electronically Signed   By: Delbert Phenix M.D.   On: 07/17/2017 15:53   Ct Angio Chest Pe W And/or Wo Contrast  Result Date: 07/17/2017 CLINICAL DATA:  Chest pain. EXAM: CT ANGIOGRAPHY CHEST WITH CONTRAST TECHNIQUE: Multidetector CT imaging of the chest was performed using the standard protocol during bolus administration of intravenous contrast. Multiplanar CT image reconstructions and MIPs were obtained to evaluate the vascular anatomy. CONTRAST:  ISOVUE-370 IOPAMIDOL (ISOVUE-370) INJECTION 76% COMPARISON:  None. FINDINGS: Cardiovascular: Heart is enlarged. No pericardial effusion. Coronary stent device noted. No filling defect within the opacified pulmonary arteries to suggest the presence of an acute  pulmonary embolus. Mediastinum/Nodes: No mediastinal lymphadenopathy. There is no hilar lymphadenopathy. The esophagus has normal imaging features. There is no axillary lymphadenopathy. Lungs/Pleura: The central tracheobronchial airways are patent. No focal airspace consolidation. No pulmonary edema or pleural effusion. Upper Abdomen: The liver shows diffusely decreased attenuation suggesting steatosis. Musculoskeletal: No worrisome lytic or sclerotic osseous abnormality. Review of the MIP images confirms the above findings. IMPRESSION: 1. No CT evidence for acute pulmonary embolus. 2. Cardiomegaly. 3. Hepatic steatosis. Electronically Signed   By: Kennith Center M.D.   On: 07/17/2017 18:38    Cardiac Studies     Patient Profile     47 y.o. male with known coronary artery disease.  He presents with a prolonged episode of chest pain that is very similar to his previous episodes of angina.  Assessment & Plan    1.  Unstable angina: Patient presents with symptoms of unstable angina.  His symptoms yesterday were very similar to his previous episodes of angina prior to his stenting.  We will schedule him for heart catheterization today.  We have discussed the risks, benefits, options.  He understands and agrees to proceed.  Hypercoagulable state: Patient has a hypercoagulable state and has been on Xarelto.   He has Been out of Xarelto for several days.     For questions or updates, please contact [facility] Please consult [email or web address] for contact info under Cardiology/STEMI.      Signed, Kristeen Miss, MD  07/18/2017, 10:14 AM

## 2017-07-18 NOTE — H&P (View-Only) (Signed)
 Progress Note  Patient Name: [name] Holland Date of Encounter: 07/18/2017  Primary Cardiologist: Berry   Subjective   47-year-old gentleman with a history of coronary artery disease-status post stenting of his LAD in 2018.  He has a history of hypercoagulability. Playing softball this weekend.  He developed some substernal chest pain the pain lasted for about 4 hours and he was brought to [facility].  He has not been taking his Xarelto or his Plavix for the past 4 days.  He apparently ran out of medications. In the emergency room, CT angiogram of the chest was negative for pulmonary embolus.  He states that the pain is very similar to his previous episodes of angina prior to his stenting.  Inpatient Medications    Scheduled Meds: . aspirin EC  81 mg Oral Daily  . atorvastatin  80 mg Oral q1800   Continuous Infusions: . heparin 1,650 Units/hr (07/18/17 0947)   PRN Meds: acetaminophen, nitroGLYCERIN, ondansetron (ZOFRAN) IV, zolpidem   Vital Signs    Vitals:   07/17/17 1900 07/17/17 2039 07/18/17 0522 07/18/17 0523  BP:  119/68 105/64   Pulse:  60 75   Resp:   20   Temp:  98.3 F (36.8 C) 97.8 F (36.6 C)   TempSrc:  Oral Oral   SpO2:  97% 95%   Weight: 264 lb 8.8 oz (120 kg)   289 lb 3.2 oz (131.2 kg)  Height: 6' 1" (1.854 m)       Intake/Output Summary (Last 24 hours) at 07/18/2017 1014 Last data filed at 07/18/2017 0700 Gross per 24 hour  Intake 768 ml  Output -  Net 768 ml   Filed Weights   07/17/17 1900 07/18/17 0523  Weight: 264 lb 8.8 oz (120 kg) 289 lb 3.2 oz (131.2 kg)    Telemetry     NSR  - Personally Reviewed  ECG     NSR , Inf. Q waves.  - Personally Reviewed  Physical Exam   GEN:  Middle-aged gentleman, moderately obese. Neck: No JVD Cardiac: RRR, no murmurs, rubs, or gallops.  Respiratory: Clear to auscultation bilaterally. GI: Soft, nontender, non-distended  MS: No edema; No deformity.   Good right radial pulses. Neuro:   Nonfocal  Psych: Normal affect   Labs    Chemistry Recent Labs  Lab 07/17/17 1537 07/18/17 0356  NA 142 141  K 3.9 3.7  CL 109 109  CO2 25 24  GLUCOSE 131* 118*  BUN 16 15  CREATININE 1.10 0.98  CALCIUM 9.7 8.4*  GFRNONAA >60 >60  GFRAA >60 >60  ANIONGAP 8 8     Hematology Recent Labs  Lab 07/17/17 1537 07/18/17 0356 07/18/17 0902  WBC 8.9 8.7 6.9  RBC 5.67 5.24 5.38  HGB 15.9 14.7 14.8  HCT 50.5 46.9 48.0  MCV 89.1 89.5 89.2  MCH 28.0 28.1 27.5  MCHC 31.5 31.3 30.8  RDW 15.4 15.6* 15.7*  PLT 214 198 178    Cardiac Enzymes Recent Labs  Lab 07/17/17 2057 07/18/17 0356  TROPONINI 0.03* <0.03    Recent Labs  Lab 07/17/17 1544  TROPIPOC 0.00     BNPNo results for input(s): BNP, PROBNP in the last 168 hours.   DDimer No results for input(s): DDIMER in the last 168 hours.   Radiology    Dg Chest 2 View  Result Date: 07/17/2017 CLINICAL DATA:  Chest pain EXAM: CHEST - 2 VIEW COMPARISON:  05/06/2017 chest radiograph. FINDINGS: Stable cardiomediastinal silhouette   with normal heart size. No pneumothorax. No pleural effusion. Lungs appear clear, with no acute consolidative airspace disease and no pulmonary edema. IMPRESSION: No active cardiopulmonary disease. Electronically Signed   By: Jason A Poff M.D.   On: 07/17/2017 15:53   Ct Angio Chest Pe W And/or Wo Contrast  Result Date: 07/17/2017 CLINICAL DATA:  Chest pain. EXAM: CT ANGIOGRAPHY CHEST WITH CONTRAST TECHNIQUE: Multidetector CT imaging of the chest was performed using the standard protocol during bolus administration of intravenous contrast. Multiplanar CT image reconstructions and MIPs were obtained to evaluate the vascular anatomy. CONTRAST:  100mL ISOVUE-370 IOPAMIDOL (ISOVUE-370) INJECTION 76% COMPARISON:  None. FINDINGS: Cardiovascular: Heart is enlarged. No pericardial effusion. Coronary stent device noted. No filling defect within the opacified pulmonary arteries to suggest the presence of an acute  pulmonary embolus. Mediastinum/Nodes: No mediastinal lymphadenopathy. There is no hilar lymphadenopathy. The esophagus has normal imaging features. There is no axillary lymphadenopathy. Lungs/Pleura: The central tracheobronchial airways are patent. No focal airspace consolidation. No pulmonary edema or pleural effusion. Upper Abdomen: The liver shows diffusely decreased attenuation suggesting steatosis. Musculoskeletal: No worrisome lytic or sclerotic osseous abnormality. Review of the MIP images confirms the above findings. IMPRESSION: 1. No CT evidence for acute pulmonary embolus. 2. Cardiomegaly. 3. Hepatic steatosis. Electronically Signed   By: Eric  Mansell M.D.   On: 07/17/2017 18:38    Cardiac Studies     Patient Profile     47 y.o. male with known coronary artery disease.  He presents with a prolonged episode of chest pain that is very similar to his previous episodes of angina.  Assessment & Plan    1.  Unstable angina: Patient presents with symptoms of unstable angina.  His symptoms yesterday were very similar to his previous episodes of angina prior to his stenting.  We will schedule him for heart catheterization today.  We have discussed the risks, benefits, options.  He understands and agrees to proceed.  Hypercoagulable state: Patient has a hypercoagulable state and has been on Xarelto.   He has Been out of Xarelto for several days.     For questions or updates, please contact [facility] Please consult [email or web address] for contact info under Cardiology/STEMI.      Signed, Kryslyn Helbig, MD  07/18/2017, 10:14 AM    

## 2017-07-19 ENCOUNTER — Encounter (HOSPITAL_COMMUNITY): Payer: Self-pay | Admitting: Internal Medicine

## 2017-07-19 DIAGNOSIS — I2510 Atherosclerotic heart disease of native coronary artery without angina pectoris: Secondary | ICD-10-CM

## 2017-07-19 DIAGNOSIS — R0789 Other chest pain: Secondary | ICD-10-CM | POA: Diagnosis not present

## 2017-07-19 DIAGNOSIS — I25110 Atherosclerotic heart disease of native coronary artery with unstable angina pectoris: Secondary | ICD-10-CM | POA: Diagnosis not present

## 2017-07-19 DIAGNOSIS — Z955 Presence of coronary angioplasty implant and graft: Secondary | ICD-10-CM | POA: Diagnosis not present

## 2017-07-19 DIAGNOSIS — E119 Type 2 diabetes mellitus without complications: Secondary | ICD-10-CM | POA: Diagnosis not present

## 2017-07-19 DIAGNOSIS — D6859 Other primary thrombophilia: Secondary | ICD-10-CM | POA: Diagnosis not present

## 2017-07-19 LAB — CBC
HEMATOCRIT: 43.5 % (ref 39.0–52.0)
HEMOGLOBIN: 13.6 g/dL (ref 13.0–17.0)
MCH: 28.1 pg (ref 26.0–34.0)
MCHC: 31.3 g/dL (ref 30.0–36.0)
MCV: 89.9 fL (ref 78.0–100.0)
Platelets: 171 10*3/uL (ref 150–400)
RBC: 4.84 MIL/uL (ref 4.22–5.81)
RDW: 15.5 % (ref 11.5–15.5)
WBC: 6.5 10*3/uL (ref 4.0–10.5)

## 2017-07-19 LAB — HEPARIN LEVEL (UNFRACTIONATED): Heparin Unfractionated: 0.17 IU/mL — ABNORMAL LOW (ref 0.30–0.70)

## 2017-07-19 MED ORDER — CLOPIDOGREL BISULFATE 75 MG PO TABS: 75.0000 mg | ORAL_TABLET | Freq: Every day | ORAL | 3 refills | Status: DC

## 2017-07-19 MED ORDER — RIVAROXABAN 20 MG PO TABS
20.0000 mg | ORAL_TABLET | Freq: Every day | ORAL | Status: DC
  Administered 2017-07-19: 20 mg via ORAL
  Filled 2017-07-19: qty 1

## 2017-07-19 MED ORDER — ISOSORBIDE MONONITRATE ER 30 MG PO TB24: 15.0000 mg | ORAL_TABLET | Freq: Every day | ORAL | 5 refills | Status: DC

## 2017-07-19 MED ORDER — RIVAROXABAN 20 MG PO TABS: 20.0000 mg | ORAL_TABLET | Freq: Every day | ORAL | 3 refills | Status: DC

## 2017-07-19 MED ORDER — ATORVASTATIN CALCIUM 80 MG PO TABS: 80.0000 mg | ORAL_TABLET | Freq: Every day | ORAL | 3 refills | Status: DC

## 2017-07-19 MED FILL — Heparin Sod (Porcine)-NaCl IV Soln 1000 Unit/500ML-0.9%: INTRAVENOUS | Qty: 500 | Status: AC

## 2017-07-19 NOTE — Progress Notes (Deleted)
ANTICOAGULATION CONSULT NOTE   Pharmacy Consult for Heparin (Xarelto on hold) Indication: chest pain/ACS, Hx DVT  No Known Allergies  Patient Measurements: Height: 6\' 1"  (185.4 cm) Weight: 291 lb 14.4 oz (132.4 kg) IBW/kg (Calculated) : 79.9 Heparin Dosing Weight: 105 kg  Vital Signs: Temp: 97.7 F (36.5 C) (07/09 0434) Temp Source: Oral (07/09 0434) BP: 104/53 (07/09 0434) Pulse Rate: 58 (07/09 0434)  Labs: Recent Labs    07/17/17 1537 07/17/17 2057 07/18/17 0356 07/18/17 0902 07/19/17 0642  HGB 15.9  --  14.7 14.8 13.6  HCT 50.5  --  46.9 48.0 43.5  PLT 214  --  198 178 171  APTT  --   --  35  --   --   HEPARINUNFRC  --   --  0.23*  --  0.17*  CREATININE 1.10  --  0.98  --   --   TROPONINI  --  0.03* <0.03 <0.03  --     Estimated Creatinine Clearance: 134.4 mL/min (by C-G formula based on SCr of 0.98 mg/dL).  Assessment: CC/HPI: 47 yo m presenting with CP - out of xarelto since tues  PMH: hx of dvt x 2, hx of stroke, hx of cad  Anticoag: rivoxaban pta for hx of dvt - last dose 7/2 Now hep for r/o acs   Renal: SCr 1.1  Heme/Onc: H&H 15.9/50.5, Plt 214  Heparin resumed 8 hrs after sheath out at 0226. HL drawn at 0642 was low at 0.17.  Planning to resume Xarelto today  Goal of Therapy:  Heparin level 0.3-0.7 units/ml Monitor platelets by anticoagulation protocol: Yes   Plan:  Increase heparin to 1750 units/hr F/u plans to resume Xarelto tonight  Danae Orleans, PharmD PGY1 Pharmacy Resident Phone [phone] 07/19/2017       9:38 AM

## 2017-07-19 NOTE — Discharge Summary (Addendum)
Discharge Summary    Patient ID: [name] Holland,  MRN: [id number], DOB/AGE: [birth year] 47 y.o.  Admit date: 07/17/2017 Discharge date: 07/19/2017  Primary Care Provider: Willow Ora Primary Cardiologist: Lesleigh Noe, MD  Discharge Diagnoses    Active Problems:   Chest pain   Unstable angina [facility])   Allergies No Known Allergies  Diagnostic Studies/Procedures    Procedures   LEFT HEART CATH AND CORONARY ANGIOGRAPHY 07/18/17  Conclusion   Conclusions: 1. Mild to moderate proximal LAD stenosis just proximal to previously placed stent. Proximal LAD stent is widely patent. 2. Normal left ventricular contraction. 3. Mildly elevated left ventricular filling pressure.  Recommendations: 1. Aggressive secondary prevention. 2. Restart heparin infusion 8 hours after hemostasis following sheath removal. 3. If no evidence of bleeding or vascular complication, restart rivaroxaban tomorrow. 4. Load with clopidogrel 300 mg x 1 tonight followed by 75 mg daily thereafter. 5. Start isosorbide mononitrate 15 mg daily for possible component of vasospasm.  Recommend to resume Rivaroxaban, at currently prescribed dose and frequency, on 07/19/17. Recommend concurrent antiplatelet therapy of Clopidogrel 75mg  daily for 1 year from time of proximal LAD PCI in 11/2016..    2D Echo 07/18/17  Study Conclusions  - Left ventricle: The cavity size was normal. Wall thickness was normal. Systolic function was normal. The estimated ejection fraction was in the range of 55% to 60%. Wall motion was normal; there were no regional wall motion abnormalities. Doppler parameters are consistent with abnormal left ventricular relaxation (grade 1 diastolic dysfunction). - Mitral valve: Calcified annulus.  Impressions:  - Normal LV systolic function; mild diastolic dysfunction.    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  He was in his usual state of health when he was at a softball game.  Between innings, he developed severe substernal chest pain.  He had been exerting himself throughout the day somewhat and was hot but he regularly works out at higher intensity levels without symptoms.  It is not normal for him to have chest pain.  His symptoms lasted for 4 hours when they resolved after taking 4 aspirin and sublingual nitroglycerin in the [facility] emergency room.  Of note, he has not been taking his Xarelto or clopidogrel for at least 4 days having run out.  Before this, he states that he would take them very regularly.    In the emergency room, EKG benign, first troponin was negative and  CT of  Chest was negative for acute pulmonary embolism. He was admitted by cardiology for further w/u and started on IV heparin.    Hospital Course     Cardiac enzymes were cycled and normal. Given his history, he underwent cardiac cath which showed patent LAD stent with mild to moderate stenosis just proximal to the stent. No obstructive CAD. Imdur added for presumed coronary vasospasm. Plavix reloaded after 4 days of missed doses. Pt will continue 75 mg daily. Also on high intensity statin, Lipitor 80 mg. No BB due to baseline bradycardia w/ HR in the 50s. He was monitored overnight post cath and had no post cath complications. He tolerated Imdur ok w/o side effects. BP remained stable. His right femoral cath site remained  stable. He was restarted on Xarelto and was given new prescriptions for his meds, including Xarelto and Plavix. He was last seen and examined by Dr. Annjanette Wertenberger, who determined he was stable for discharge home. He will f/u with Dr. Berry or APP on his team.    Consultants: none  Discharge Vitals Blood pressure (!) 104/53, pulse (!) 58, temperature 97.7 F (36.5 C), temperature source Oral, resp. rate (!) 0, height 6\' 1"  (1.854 m), weight 291 lb 14.4 oz (132.4 kg), SpO2 96 %.  Filed Weights   07/17/17 1900 07/18/17 0523 07/19/17 0300  Weight: 264 lb 8.8 oz (120 kg) 289 lb 3.2 oz (131.2 kg) 291 lb 14.4 oz (132.4 kg)    Labs & Radiologic Studies    CBC Recent Labs    07/18/17 0902 07/19/17 0642  WBC 6.9 6.5  NEUTROABS 3.1  --   HGB 14.8 13.6  HCT 48.0 43.5  MCV 89.2 89.9  PLT 178 171   Basic Metabolic Panel Recent Labs    07/17/17 1537 07/18/17 0356  NA 142 141  K 3.9 3.7  CL 109 109  CO2 25 24  GLUCOSE 131* 118*  BUN 16 15  CREATININE 1.10 0.98  CALCIUM 9.7 8.4*   Liver Function Tests No results for input(s): AST, ALT, ALKPHOS, BILITOT, PROT, ALBUMIN in the last 72 hours. No results for input(s): LIPASE, AMYLASE in the last 72 hours. Cardiac Enzymes Recent Labs    07/17/17 2057 07/18/17 0356 07/18/17 0902  TROPONINI 0.03* <0.03 <0.03   BNP Invalid input(s): POCBNP D-Dimer No results for input(s): DDIMER in the last 72 hours. Hemoglobin A1C No results for input(s): HGBA1C in the last 72 hours. Fasting Lipid Panel No results for input(s): CHOL, HDL, LDLCALC, TRIG, CHOLHDL, LDLDIRECT in the last 72 hours. Thyroid Function Tests No results for input(s): TSH, T4TOTAL, T3FREE, THYROIDAB in the last 72 hours.  Invalid input(s): FREET3 _____________  Dg Chest 2 View  Result Date: 07/17/2017 CLINICAL DATA:  Chest pain EXAM: CHEST - 2 VIEW COMPARISON:  05/06/2017 chest radiograph. FINDINGS: Stable cardiomediastinal silhouette with normal heart size. No pneumothorax. No pleural effusion. Lungs appear clear, with no acute consolidative airspace disease and no pulmonary edema. IMPRESSION: No active cardiopulmonary disease. Electronically Signed   By: Jason A Poff M.D.   On: 07/17/2017 15:53   Ct Angio Chest Pe W And/or  Wo Contrast  Result Date: 07/17/2017 CLINICAL DATA:  Chest pain. EXAM: CT ANGIOGRAPHY CHEST WITH CONTRAST TECHNIQUE: Multidetector CT imaging of the chest was performed using the standard protocol during bolus administration of intravenous contrast. Multiplanar CT image reconstructions and MIPs were obtained to evaluate the vascular anatomy. CONTRAST:  <[phone]-HumphreyCalvary HospitCharloPiney (813EleConcord Ambulatory Surgery Center [address]<MEASUREMENTCorn([phone] 717m5 HumphreySanford Aberdeen MedicalFrack872EleAustin Gi Surgicenter [address] Kitch<[phone]-HumphreyChristus St Mary Outpatient Center Mid CouK3EleClarion Hospi718 ApplegMarland Kitch<MEASUREMENTBe4([phone]-HumphreyLos Angeles County Olive View-Ucl[facility] Medical CeClear252EleWoodlawn Hospi70Marland Kitch<MEASUREMENTP([phone]-HumphreyNorthwest Medical Center - [facility]825EleOptima Ophthalmic Medical Associates [address]<MEASUREMENTJerus([phone]-HumphreyCsa Surgical Center LCoyote 419EleGrady General Hospi717 AMarland Kitch<MEASUREMENTMi4207-265m0-HumphreyLifecare Hospitals Of [facility] - Mc[facility]en NorLowerCuba(548EleGeorge Regional Hospi33 HaMarland Kitch<MEASUREMENTOrderv977356m37HumphreySelect Specialty Hospital MckeespoColonial He(256)EleBrown Cty Community Treatment Cen939 CambrMarland Kitch<MEASUREMENTDu [phone]-HumphreyAscension Sacred Heart Rehab InCroO(480EleRedington-Fairview General Hospi9969 VMarland Kitch<MEASUREMENTMeadow Br8681 558m6 HumphreyTruman Medical Center - Hospital HiBrasEl(641EleEastern Connecticut Endoscopy Cen125 LMarland Kitch<MEASUREMENTLittle Stur([phone]-HumphreyLake Huron Medical CenBelv830EleMercy Hospital Pa261 ToMarland Kitch<MEASUREMENTCalvert B([phone]-HumphreyVan Buren County HospitPoint 732EleHiLLCrest Hospi8872 ColMarland Kitch<MEASUREMENTValley 5408 345m9 HumphreyOur Children'S House At BEp720EleEye Surgery Specialists Of Puerto Rico 3Marland Kitch<MEASUREMENTFallon [phone]-HumphreyRehabilitation Hospital Of The PaciSouth (209EleBanner Gateway Medical Cen[address]Mar[address] Kitch<MEASUREMENTArp([id number] Endoscopy LPe639EleNorwood Hlth 19 WestpMarland Kitchenort StreetnneschesMIDOL (ISOVUE-370) INJECTION 76% COMPARISON:  None. FINDINGS: Cardiovascular: Heart is enlarged. No pericardial effusion. Coronary stent device noted. No filling defect within the opacified pulmonary arteries to suggest the presence of an acute pulmonary embolus. Mediastinum/Nodes: No mediastinal lymphadenopathy. There is no hilar lymphadenopathy. The esophagus has normal imaging features. There is no axillary lymphadenopathy. Lungs/Pleura: The central tracheobronchial airways are patent. No focal airspace consolidation. No pulmonary edema or pleural effusion. Upper Abdomen: The liver shows diffusely decreased attenuation suggesting steatosis. Musculoskeletal: No worrisome lytic or sclerotic osseous abnormality. Review of the MIP images confirms the above findings. IMPRESSION: 1. No CT evidence for acute pulmonary embolus. 2. Cardiomegaly. 3. Hepatic steatosis. Electronically Signed   By: Eric  Mansell M.D.   On: 07/17/2017 18:38   Disposition   Pt is being discharged home today in good  condition.  Follow-up Plans & Appointments    Follow-up Information    Runell Gess, MD Follow up.   Specialties:  Cardiology, Radiology Why:  our office will call you with a hospital follow-up visit.  Contact information: [address] [place] [address] [phone]          Discharge Instructions    Diet - low sodium heart healthy   Complete by:  As directed    Increase activity slowly   Complete by:  As directed       Discharge Medications   Allergies as of 07/19/2017   No Known Allergies     Medication  List    STOP taking these medications   MAGNESIUM PO     TAKE these medications   atorvastatin 80 MG tablet Commonly known as:  LIPITOR Take 1 tablet (80 mg total) by mouth daily at 6 PM.   clopidogrel 75 MG tablet Commonly known as:  PLAVIX Take 1 tablet (75 mg total) by mouth daily. What changed:  See the new instructions.   isosorbide mononitrate 30 MG 24 hr tablet Commonly known as:  IMDUR Take 0.5 tablets (15 mg total) by mouth daily. Start taking on:  07/20/2017   nitroGLYCERIN 0.4 MG SL tablet Commonly known as:  NITROSTAT Place 1 tablet (0.4 mg total) under the tongue every 5 (five) minutes as needed for chest pain.   OVER THE COUNTER MEDICATION Take 1 tablet by mouth 3 (three) times daily. OTC supplement to lower cholesterol   rivaroxaban 20 MG Tabs tablet Commonly known as:  XARELTO Take 1 tablet (20 mg total) by mouth daily with supper. What changed:  when to take this        Acute coronary syndrome (MI, NSTEMI, STEMI, etc) this admission?: No.    Outstanding Labs/Studies   None   Duration of Discharge Encounter   Greater than 30 minutes including physician time.  Signed, Robbie Lis, PA-C 07/19/2017, 1:55 PM   Attending Note:   The patient was seen and examined.  Agree with assessment and plan as noted above.  Changes made to the above note as needed.  Patient seen and independently examined with  Robbie Lis, PA .   We discussed all aspects of the encounter. I agree with the assessment and plan as stated above.  1.   CP :   Cath showed patent stent .   Start Imdur for possible spasm. Restart Plavix   2.   hypercoagulable state:   Restart Xarelto     I have spent a total of 40 minutes with patient reviewing hospital  notes , telemetry, EKGs, labs and examining patient as well as establishing an assessment and plan that was discussed with the patient. > 50% of time was spent in direct patient care.    Vesta Mixer, Montez Hageman., MD,  [facility] 07/19/2017, 2:53 PM [address]  [address] Office [phone] Pager [phone]

## 2017-07-19 NOTE — Progress Notes (Addendum)
Progress Note  Patient Name: [name] Holland Date of Encounter: 07/19/2017  Primary Cardiologist: Lesleigh Noe, MD   Subjective   Doing well this morning. No complaints. Denies CP. No dyspnea. Right femoral groin site stable. Ambulating w/o difficulty.   Inpatient Medications    Scheduled Meds: . atorvastatin  80 mg Oral q1800  . clopidogrel  75 mg Oral Q breakfast  . isosorbide mononitrate  15 mg Oral Daily  . sodium chloride flush  3 mL Intravenous Q12H   Continuous Infusions: . sodium chloride 250 mL (07/19/17 0230)  . heparin 1,650 Units/hr (07/19/17 0226)   PRN Meds: sodium chloride, acetaminophen, nitroGLYCERIN, ondansetron (ZOFRAN) IV, sodium chloride flush, zolpidem   Vital Signs    Vitals:   07/18/17 1734 07/18/17 2024 07/19/17 0300 07/19/17 0434  BP: 126/78 (!) 110/55  (!) 104/53  Pulse: (!) 0 86  (!) 58  Resp: (!) 0     Temp:  98.2 F (36.8 C)  97.7 F (36.5 C)  TempSrc:  Oral  Oral  SpO2: (!) 0% 97%  96%  Weight:   291 lb 14.4 oz (132.4 kg)   Height:        Intake/Output Summary (Last 24 hours) at 07/19/2017 0744 Last data filed at 07/19/2017 0636 Gross per 24 hour  Intake 1104.29 ml  Output -  Net 1104.29 ml   Filed Weights   07/17/17 1900 07/18/17 0523 07/19/17 0300  Weight: 264 lb 8.8 oz (120 kg) 289 lb 3.2 oz (131.2 kg) 291 lb 14.4 oz (132.4 kg)    Telemetry    Sinus brady in the 50s - Personally Reviewed  ECG    Sinus brady 56 bpm  - Personally Reviewed  Physical Exam   GEN: No acute distress.   Neck: No JVD Cardiac: RRR, no murmurs, rubs, or gallops.  Respiratory: Clear to auscultation bilaterally. GI: Soft, nontender, non-distended  MS: No edema; No deformity. Neuro:  Nonfocal  Psych: Normal affect   Labs    Chemistry Recent Labs  Lab 07/17/17 1537 07/18/17 0356  NA 142 141  K 3.9 3.7  CL 109 109  CO2 25 24  GLUCOSE 131* 118*  BUN 16 15  CREATININE 1.10 0.98  CALCIUM 9.7 8.4*  GFRNONAA >60 >60  GFRAA >60  >60  ANIONGAP 8 8     Hematology Recent Labs  Lab 07/18/17 0356 07/18/17 0902 07/19/17 0642  WBC 8.7 6.9 6.5  RBC 5.24 5.38 4.84  HGB 14.7 14.8 13.6  HCT 46.9 48.0 43.5  MCV 89.5 89.2 89.9  MCH 28.1 27.5 28.1  MCHC 31.3 30.8 31.3  RDW 15.6* 15.7* 15.5  PLT 198 178 171    Cardiac Enzymes Recent Labs  Lab 07/17/17 2057 07/18/17 0356 07/18/17 0902  TROPONINI 0.03* <0.03 <0.03    Recent Labs  Lab 07/17/17 1544  TROPIPOC 0.00     BNPNo results for input(s): BNP, PROBNP in the last 168 hours.   DDimer No results for input(s): DDIMER in the last 168 hours.   Radiology    Dg Chest 2 View  Result Date: 07/17/2017 CLINICAL DATA:  Chest pain EXAM: CHEST - 2 VIEW COMPARISON:  05/06/2017 chest radiograph. FINDINGS: Stable cardiomediastinal silhouette with normal heart size. No pneumothorax. No pleural effusion. Lungs appear clear, with no acute consolidative airspace disease and no pulmonary edema. IMPRESSION: No active cardiopulmonary disease. Electronically Signed   By: Delbert Phenix M.D.   On: 07/17/2017 15:53   Ct Angio Chest Pe  W And/or Wo Contrast  Result Date: 07/17/2017 CLINICAL DATA:  Chest pain. EXAM: CT ANGIOGRAPHY CHEST WITH CONTRAST TECHNIQUE: Multidetector CT imaging of the chest was performed using the standard protocol during bolus administration of intravenous contrast. Multiplanar CT image reconstructions and MIPs were obtained to evaluate the vascular anatomy. CONTRAST:  ISOVUE-370 IOPAMIDOL (ISOVUE-370) INJECTION 76% COMPARISON:  None. FINDINGS: Cardiovascular: Heart is enlarged. No pericardial effusion. Coronary stent device noted. No filling defect within the opacified pulmonary arteries to suggest the presence of an acute pulmonary embolus. Mediastinum/Nodes: No mediastinal lymphadenopathy. There is no hilar lymphadenopathy. The esophagus has normal imaging features. There is no axillary lymphadenopathy. Lungs/Pleura: The central tracheobronchial airways  are patent. No focal airspace consolidation. No pulmonary edema or pleural effusion. Upper Abdomen: The liver shows diffusely decreased attenuation suggesting steatosis. Musculoskeletal: No worrisome lytic or sclerotic osseous abnormality. Review of the MIP images confirms the above findings. IMPRESSION: 1. No CT evidence for acute pulmonary embolus. 2. Cardiomegaly. 3. Hepatic steatosis. Electronically Signed   By: Kennith Center M.D.   On: 07/17/2017 18:38    Cardiac Studies   Procedures   LEFT HEART CATH AND CORONARY ANGIOGRAPHY 07/18/17  Conclusion   Conclusions: 1. Mild to moderate proximal LAD stenosis just proximal to previously placed stent.  Proximal LAD stent is widely patent. 2. Normal left ventricular contraction. 3. Mildly elevated left ventricular filling pressure.  Recommendations: 1. Aggressive secondary prevention. 2. Restart heparin infusion 8 hours after hemostasis following sheath removal. 3. If no evidence of bleeding or vascular complication, restart rivaroxaban tomorrow. 4. Load with clopidogrel 300 mg x 1 tonight followed by 75 mg daily thereafter. 5. Start isosorbide mononitrate 15 mg daily for possible component of vasospasm.  Recommend to resume Rivaroxaban, at currently prescribed dose and frequency, on 07/19/17.  Recommend concurrent antiplatelet therapy of Clopidogrel 75mg  daily for 1 year from time of proximal LAD PCI in 11/2016..     2D Echo 07/18/17  Study Conclusions  - Left ventricle: The cavity size was normal. Wall thickness was   normal. Systolic function was normal. The estimated ejection   fraction was in the range of 55% to 60%. Wall motion was normal;   there were no regional wall motion abnormalities. Doppler   parameters are consistent with abnormal left ventricular   relaxation (grade 1 diastolic dysfunction). - Mitral valve: Calcified annulus.  Impressions:  - Normal LV systolic function; mild diastolic dysfunction.  Patient  Profile     47 y.o. male with a history of coronary artery disease-status post stenting of his LAD in 2018.  He has a history of hypercoagulability. Recently noncompliant with meds. Ran out of Xarelto and Plavix 4 days prior to admission. Presented to the ED on 07/17/17 with complaint of chest pain. CT angiogram of the chest was negative for pulmonary embolus. LHC with patent LAD stent and nonobstructive disease. Echo normal.    Assessment & Plan    1. Chest Pain: CT angiogram of the chest was negative for pulmonary embolus. LHC with patent LAD stent and nonobstructive disease. ? Coronary vasospasm. Imdur added. BP stable.   2. CAD: per above. LHC 07/18/17 showed patent LAD stent with mild to moderate stenosis just proximal to the stent. No obstructive CAD. Imdur added for presumed coronary vasospasm. Plavix reloaded after 4 days of missed doses. Pt will continue 75 mg daily. Also on high intensity statin, Lipitor 80 mg. No BB due to baseline bradycardia w/ HR in the 50s.    3. Hypercoagulable  State: pt previously on Xarelto but ran out of meds several days prior to admit. Still on IV heparin. Cath access site is stable. Will resume Xarelto today.   Dc/ home today after MD clearance.    For questions or updates, please contact [facility] Please consult [email or web address] for contact info under Cardiology/STEMI.      Signed, Robbie Lis, PA-C  07/19/2017, 7:44 AM     Attending Note:   The patient was seen and examined.  Agree with assessment and plan as noted above.  Changes made to the above note as needed.  Patient seen and independently examined with Boyce Medici, PA .   We discussed all aspects of the encounter. I agree with the assessment and plan as stated above.  1.  Chest pain :   Cath shows a patent stent  We have added Imdur for possible coronary spasm. Resume plavix and xarelto Follow-up with Dr. Katrinka Blazing. 2.  Hypercoagulable state: Resume Xarelto.  Patient stable for  discharge.    I have spent a total of 40 minutes with patient reviewing hospital  notes , telemetry, EKGs, labs and examining patient as well as establishing an assessment and plan that was discussed with the patient. > 50% of time was spent in direct patient care.    Vesta Mixer, Montez Hageman., MD, [facility] 07/19/2017, 9:21 AM [address],  [address] Office [phone] Pager [phone]

## 2017-07-19 NOTE — Plan of Care (Signed)
  Problem: Activity: Goal: Risk for activity intolerance will decrease Outcome: Progressing Note:  Ambulates to bathroom independently without difficulty.   Problem: Elimination: Goal: Will not experience complications related to urinary retention Outcome: Progressing Note:  Voiding adequate amount without difficulty.   Problem: Pain Managment: Goal: General experience of comfort will improve Outcome: Progressing Note:  Denies c/o pain or discomfort.

## 2017-09-02 ENCOUNTER — Encounter (HOSPITAL_COMMUNITY): Payer: Self-pay | Admitting: *Deleted

## 2017-09-02 ENCOUNTER — Emergency Department (HOSPITAL_COMMUNITY)
Admission: EM | Admit: 2017-09-02 | Discharge: 2017-09-02 | Disposition: A | Discharge status: Home / Self Care | Payer: BLUE CROSS/BLUE SHIELD | Attending: Emergency Medicine | Admitting: Emergency Medicine

## 2017-09-02 DIAGNOSIS — M5442 Lumbago with sciatica, left side: Secondary | ICD-10-CM

## 2017-09-02 DIAGNOSIS — Z87891 Personal history of nicotine dependence: Secondary | ICD-10-CM | POA: Insufficient documentation

## 2017-09-02 DIAGNOSIS — I2510 Atherosclerotic heart disease of native coronary artery without angina pectoris: Secondary | ICD-10-CM | POA: Insufficient documentation

## 2017-09-02 DIAGNOSIS — Z7901 Long term (current) use of anticoagulants: Secondary | ICD-10-CM | POA: Insufficient documentation

## 2017-09-02 DIAGNOSIS — E1149 Type 2 diabetes mellitus with other diabetic neurological complication: Secondary | ICD-10-CM | POA: Insufficient documentation

## 2017-09-02 DIAGNOSIS — Z79899 Other long term (current) drug therapy: Secondary | ICD-10-CM | POA: Insufficient documentation

## 2017-09-02 DIAGNOSIS — Z7902 Long term (current) use of antithrombotics/antiplatelets: Secondary | ICD-10-CM | POA: Insufficient documentation

## 2017-09-02 MED ORDER — METHOCARBAMOL 500 MG PO TABS: 500.0000 mg | ORAL_TABLET | Freq: Two times a day (BID) | ORAL | 0 refills | Status: DC

## 2017-09-02 MED ORDER — PREDNISONE 10 MG PO TABS: 40.0000 mg | ORAL_TABLET | Freq: Every day | ORAL | 0 refills | Status: DC

## 2017-09-02 MED ORDER — LIDOCAINE 5 % EX PTCH: 1.0000 | MEDICATED_PATCH | CUTANEOUS | 0 refills | Status: DC

## 2017-09-02 MED ORDER — MORPHINE SULFATE (PF) 4 MG/ML IV SOLN
4.0000 mg | Freq: Once | INTRAVENOUS | Status: AC
  Administered 2017-09-02: 4 mg via INTRAMUSCULAR
  Filled 2017-09-02: qty 1

## 2017-09-02 MED ORDER — DICLOFENAC SODIUM 1 % TD GEL: 4.0000 g | Freq: Four times a day (QID) | TRANSDERMAL | 0 refills | Status: DC

## 2017-09-02 NOTE — ED Triage Notes (Signed)
Pt complains of left sided back pain radiating to left calf. Pt has hx of sciatica.

## 2017-09-02 NOTE — ED Provider Notes (Signed)
[facility]-EMERGENCY DEPT Provider Note   CSN: [id number] Arrival date & time: 09/02/17  1017     History   Chief Complaint Chief Complaint  Patient presents with  . Back Pain    HPI [name] Holland is a 47 y.o. male with a PMhx as below currently on Xarelto who presents emergency department today for left back pain.  Patient reports that he began having left-sided back pain approximately 2-3 days ago that radiates down his left leg into his left calf.  He reports that this is similar to his prior episodes of sciatica in the past.  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  He notes he has been taking Tylenol for his symptoms without any relief.  He does not currently have an orthopedist in the area.  He reports that walking and palpation make his symptoms worse.  He reports occasionally have a tingling sensation in the same area of pain but no numbness. Denies history of cancer, trauma, fever, IV drug use, recent spinal manipulation or procedures, upper back pain or neck pain, chest pain, shortness of breath, abdominal pain numbness/weakness of the lower extremities, urinary retention, loss of bowel/bladder function, saddle anesthesia, or unexplained weight loss. Denies dysuria, flank pain, suprapubic pain, frequency, urgency, or hematuria.   HPI  Past Medical History:  Diagnosis Date  . Acute deep vein thrombosis (DVT) of femoral vein (HCC)    dVT  . Arthritis    "knees" (11/23/2016)  . Clotting disorder (HCC)   . Cocaine abuse in remission (HCC) 2014  . Coronary artery disease due to lipid rich plaque   . DVT (deep venous thrombosis) (HCC) 05/2016   LLE  . 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 Hyperlipidemia   . Intracranial and intraspinal phlebitis and thrombophlebitis 05/31/2014  . Myocardial infarction (HCC) 2016   "caused by blood clot in the left side  of my head"  . Stroke (HCC) 05/2014   denies residual on 11/23/2016  . Type II diabetes mellitus [facility])     Patient Active Problem List   Diagnosis Date Noted  . Unstable angina (HCC)   . Chest pain 07/17/2017  . Coronary artery disease due to lipid rich plaque   . Non-ST elevation (NSTEMI) myocardial infarction (HCC)   . History of DVT (deep vein thrombosis) 11/23/2016  . 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 Long term current use of anticoagulant 06/17/2016  . Mixed hyperlipidemia 05/31/2016  . Bilateral primary osteoarthritis of knee 04/21/2015  . Cocaine abuse in remission (HCC) 04/21/2015  . Obesity (BMI 30-39.9)   . CVA (cerebral infarction) 05/31/2014  . 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 CARDIAC CATHETERIZATION  2008  . CORONARY STENT INTERVENTION N/A 11/24/2016   Procedure: CORONARY STENT INTERVENTION;  Surgeon: Kathleene Hazel, MD;  Location: [facility] INVASIVE CV LAB;  Service: Cardiovascular;  Laterality: N/A;  . CORONARY THROMBECTOMY N/A 11/24/2016   Procedure: Coronary Thrombectomy;  Surgeon: Kathleene Hazel, MD;  Location: [facility] INVASIVE CV LAB;  Service: Cardiovascular;  Laterality: N/A;  . HIP SURGERY Right   . KNEE ARTHROSCOPY Bilateral early 2000s  . LEFT HEART CATH AND CORONARY ANGIOGRAPHY N/A 11/24/2016   Procedure: LEFT HEART CATH  AND CORONARY ANGIOGRAPHY;  Surgeon: Kathleene Hazel, MD;  Location: [facility] INVASIVE CV LAB;  Service: Cardiovascular;  Laterality: N/A;  . LEFT HEART CATH AND CORONARY ANGIOGRAPHY N/A 07/18/2017   Procedure: LEFT HEART CATH AND CORONARY ANGIOGRAPHY;  Surgeon: Yvonne Kendall, MD;  Location: [facility] INVASIVE CV LAB;  Service: Cardiovascular;  Laterality: N/A;  . WRIST  SURGERY Right         Home Medications    Prior to Admission medications   Medication Sig Start Date End Date Taking? Authorizing Provider  atorvastatin (LIPITOR) 80 MG tablet Take 1 tablet (80 mg total) by mouth daily at 6 PM. 07/19/17  Yes Robbie Lis M, PA-C  clopidogrel (PLAVIX) 75 MG tablet Take 1 tablet (75 mg total) by mouth daily. 07/19/17  Yes Robbie Lis M, PA-C  isosorbide mononitrate (IMDUR) 30 MG 24 hr tablet Take 0.5 tablets (15 mg total) by mouth daily. 07/20/17  Yes Robbie Lis M, PA-C  rivaroxaban (XARELTO) 20 MG TABS tablet Take 1 tablet (20 mg total) by mouth daily with supper. Patient taking differently: Take 20 mg by mouth every morning.  07/19/17  Yes Robbie Lis M, PA-C  nitroGLYCERIN (NITROSTAT) 0.4 MG SL tablet Place 1 tablet (0.4 mg total) under the tongue every 5 (five) minutes as needed for chest pain. Patient not taking: Reported on 07/17/2017 05/11/17 08/09/17  Jodelle Gross, NP    Family History Family History  Problem Relation Age of Onset  . Other Mother   . Diabetes type II Mother   . Asthma Mother   . Diabetes Mother   . CAD Father        CABG  . Hypertension Father   . Hyperlipidemia Father   . Pancreatic cancer Maternal Aunt   . Lung cancer Maternal Aunt   . CAD Maternal Grandmother   . Cerebral aneurysm Paternal Grandmother   . Cancer Paternal Grandfather        unknown    Social History Social History   Tobacco Use  . Smoking status: Former Smoker    Packs/day: 1.00    Years: 24.00    Pack years: 24.00    Types: Cigarettes    Last attempt to quit: 2014    Years since quitting: 5.6  . Smokeless tobacco: Current User    Types: Snuff  Substance Use Topics  . Alcohol use: No  . Drug use: No    Types: Cocaine    Comment: 11/23/2016 "tx for cocaine abuse in 2014"     Allergies   Patient has no known allergies.   Review of Systems Review of Systems  All other systems reviewed and are  negative.    Physical Exam Updated Vital Signs BP 124/78 (BP Location: Left Arm)   Pulse 65   Temp 98.3 F (36.8 C) (Oral)   Resp 16   Ht 6\' 1"  (1.854 m)   Wt 127.5 kg   SpO2 98%   BMI 37.07 kg/m   Physical Exam  Constitutional: He appears well-developed and well-nourished. No distress.  Non-toxic appearing  HENT:  Head: Normocephalic and atraumatic.  Right Ear: External ear normal.  Left Ear: External ear normal.  Neck: Normal range of motion. Neck supple. No spinous process tenderness present. No neck rigidity. Normal range of motion present.  Cardiovascular: Normal rate, regular rhythm, normal heart sounds and intact distal pulses.  No murmur heard. Pulses:      Radial pulses are 2+ on the right side, and 2+ on the left  side.       Femoral pulses are 2+ on the right side, and 2+ on the left side.      Dorsalis pedis pulses are 2+ on the right side, and 2+ on the left side.       Posterior tibial pulses are 2+ on the right side, and 2+ on the left side.  Pulmonary/Chest: Effort normal and breath sounds normal. No respiratory distress.  Abdominal: Soft. Bowel sounds are normal. He exhibits no pulsatile midline mass. There is no tenderness. There is no rigidity, no rebound and no CVA tenderness.  Musculoskeletal:       Left hip: Normal.       Left knee: Normal.       Left ankle: Normal.  Posterior and appearance appears normal. No evidence of obvious scoliosis or kyphosis. No obvious signs of skin changes, trauma, deformity, infection. No C, T, or L spine tenderness or step-offs to palpation. No C, T, paraspinal tenderness. Left paraspinal lumbar ttp. Lung expansion normal. Bilateral lower extremity strength 5 out of 5 including extensor hallucis longus. Patellar and Achilles deep tendon reflex 2+ and equal bilaterally. Sensation of lower extremities grossly intact. Gait able but patient notes painful. Lower extremity compartments soft. PT and DP 2+ b/l. Cap refill <2 seconds.    Neurological: He is alert. He has normal strength. No sensory deficit. Gait normal.  No foot drop. Able gait.   Skin: Skin is warm, dry and intact. Capillary refill takes less than 2 seconds. No rash noted. He is not diaphoretic. No erythema.  No vesicular-like rash noted.  Nursing note and vitals reviewed.    ED Treatments / Results  Labs (all labs ordered are listed, but only abnormal results are displayed) Labs Reviewed - No data to display  EKG None  Radiology No results found.  Procedures Procedures (including critical care time)  Medications Ordered in ED Medications - No data to display   Initial Impression / Assessment and Plan / ED Course  I have reviewed the triage vital signs and the nursing notes.  Pertinent labs & imaging results that were available during my care of the patient were reviewed by me and considered in my medical decision making (see chart for details).     47 y.o. male with atraumatic back pain.  No neurologic deficits and normal neuro exam.  Patient can walk but states it is painful.  No bowel or bladder incontinence.  No urinary retention or saddle anesthesia.  No concern for cauda equina.  No history of trauma, personal history of cancer, night sweats or weight loss that would warrant a x-ray at this time.  No spinal injections, fever or IV drug use that make me concerned for spinal hematoma or abscess.  No pulsatile mass of the abdomen.  No abdominal tenderness to palpation or guarding to make me concern for intra-abdominal pathology.  No urinary symptoms or CVA tenderness to make me concerned for cystitis versus pyelonephritis versus kidney stone.  Suspect patient's symptoms are musculoskeletal in nature. There is evidence of sciatica with positive straight leg raise and history.  Will treat the patient with back exercises, activity modification, muscle relaxers, Voltaren gel (on Xarelto).  Will also give steroids for sciatica.  Patient is to  follow-up with PCP versus orthopedics.  Strict return precautions discussed.  Patient appears safe for discharge.  Final Clinical Impressions(s) / ED Diagnoses   Final diagnoses:  Acute left-sided low back pain with left-sided sciatica    ED  Discharge Orders         Ordered    predniSONE (DELTASONE) 10 MG tablet  Daily with breakfast     09/02/17 1148    lidocaine (LIDODERM) 5 %  Every 24 hours     09/02/17 1148    diclofenac sodium (VOLTAREN) 1 % GEL  4 times daily     09/02/17 1148    methocarbamol (ROBAXIN) 500 MG tablet  2 times daily     09/02/17 [address] 09/02/17 1148    Azalia Bilis, MD 09/02/17 1411

## 2017-09-02 NOTE — Discharge Instructions (Addendum)
You were seen here today for Back Pain: Low back pain is discomfort in the lower back that may be due to injuries to muscles and ligaments around the spine. Occasionally, it may be caused by a problem to a part of the spine called a disc. Your back pain should be treated with medicines listed below as well as back exercises and this back pain should get better over the next 2 weeks. Most patients get completely well in 4 weeks. It is important to know however, if you develop severe or worsening pain, low back pain with fever, numbness, weakness or inability to walk or urinate, you should return to the ER immediately.  Please follow up with your doctor this week for a recheck if still having symptoms.  HOME INSTRUCTIONS Self - care:  The application of heat can help soothe the pain.  Maintaining your daily activities, including walking (this is encouraged), as it will help you get better faster than just staying in bed. Do not life, push, pull anything more than 10 pounds for the next week. I am attaching back exercises that you can do at home to help facilitate your recovery.   Back Exercises - I have attached a handout on back exercises that can be done at home to help facilitate your recovery.   Medications are also useful to help with pain control.   Acetaminophen.  This medication is generally safe, and found over the counter. Take as directed for your age. You should not take more than 8 of the extra strength (500mg ) pills a day (max dose is 4000mg  total OVER one day)  Non steroidal anti inflammatory: (You cannot take these orally as you are on Xarelto). If you can not take medications such as ibuprofen and naproxen, medications such as Diclofenac (Voltaren) gel can be applied directly to the area of pain.   Muscle relaxants:  These medications can help with muscle tightness that is a cause of lower back pain.  Most of these medications can cause drowsiness, and it is not safe to drive or use  dangerous machinery while taking them. They are primarily helpful when taken at night before sleep.  Prednisone -  Prednisone can be used for low back pain when nerves such as the sciatic nerve are though to be involved. This medication will help decrease the inflammation around the nerve and help facilitate faster recovery. Call your pharmacist if you have any questions.  Please note if you are diabetic this can increase your blood sugar levels.  Please monitor at home.  You will need to follow up with your primary healthcare provider or the Orthopedist in 1-2 weeks for reassessment and persistent symptoms.  Be aware that if you develop new symptoms, such as a fever, leg weakness, difficulty with or loss of control of your urine or bowels, abdominal pain, or more severe pain, you will need to seek medical attention and/or return to the Emergency department. Additional Information:  Your vital signs today were: BP 124/78 (BP Location: Left Arm)    Pulse 65    Temp 98.3 F (36.8 C) (Oral)    Resp 16    Ht 6\' 1"  (1.854 m)    Wt 127.5 kg    SpO2 98%    BMI 37.07 kg/m  If your blood pressure (BP) was elevated above 135/85 this visit, please have this repeated by your doctor within one month. ---------------

## 2017-10-07 ENCOUNTER — Other Ambulatory Visit: Payer: Self-pay | Admitting: Cardiology

## 2017-10-07 NOTE — Progress Notes (Signed)
  Received OP phone call with pt sating he is completely out of Xarelto. Unable to pick up 30 day refill due cost of $500. Unfortunately our office is closed until Monday morning, thus unable to assist with free samples. I contacted his pharmacy. We can get 3 tablets to get him through the weekend for $44. He will need to call the office on Monday to see if he can get samples. Will also route info to Dr. Allyson Sabal to see if he should be transitioned to the more affordable option of coumadin.   Robbie Lis, PA-C 10/07/2017

## 2017-10-10 ENCOUNTER — Telehealth: Payer: Self-pay

## 2017-10-10 NOTE — Telephone Encounter (Signed)
Spoke with pt. Adv pt that per Joni Reining, D-NP We will provide him with a 4 week supply of Xarelto 20mg  daily samples. He will need to f/u with his pcp for further samples or prescription refills.  Pt verbalized understanding and voiced appreciation for the assistance.

## 2017-10-10 NOTE — Telephone Encounter (Signed)
Message received in triage from on call provider. Over the weekend the pt contacted our on call service to ask for samples of Xarelto 20mg  daily. The pt is mot scheduled to f/u with cardiology and has been released to his Pcp and Neurologist. Pt was last seen by Joni Reining, D-NP in May 2019. Per Wyatt Mage ok to give pt samples of Xarellto 20mg  daily (1 months supply) we are not responsible for his prescription, he needs to f/u with his pcp for further samples or prescription refills.  Called the phone number listed for the pt, was told by a gentleman that answered the phone that this was the pt work number but he no longer works there.  Called the Emergency number listed. [email or web address].

## 2017-10-14 ENCOUNTER — Emergency Department (HOSPITAL_COMMUNITY)
Admission: EM | Admit: 2017-10-14 | Discharge: 2017-10-14 | Disposition: A | Discharge status: Home / Self Care | Payer: Self-pay | Attending: Emergency Medicine | Admitting: Emergency Medicine

## 2017-10-14 ENCOUNTER — Other Ambulatory Visit: Payer: Self-pay

## 2017-10-14 ENCOUNTER — Telehealth: Payer: Self-pay | Admitting: *Deleted

## 2017-10-14 DIAGNOSIS — I252 Old myocardial infarction: Secondary | ICD-10-CM | POA: Insufficient documentation

## 2017-10-14 DIAGNOSIS — Z23 Encounter for immunization: Secondary | ICD-10-CM | POA: Insufficient documentation

## 2017-10-14 DIAGNOSIS — Z86718 Personal history of other venous thrombosis and embolism: Secondary | ICD-10-CM | POA: Insufficient documentation

## 2017-10-14 DIAGNOSIS — E119 Type 2 diabetes mellitus without complications: Secondary | ICD-10-CM | POA: Insufficient documentation

## 2017-10-14 DIAGNOSIS — Z79899 Other long term (current) drug therapy: Secondary | ICD-10-CM | POA: Insufficient documentation

## 2017-10-14 DIAGNOSIS — Y999 Unspecified external cause status: Secondary | ICD-10-CM | POA: Insufficient documentation

## 2017-10-14 DIAGNOSIS — S51811A Laceration without foreign body of right forearm, initial encounter: Secondary | ICD-10-CM | POA: Insufficient documentation

## 2017-10-14 DIAGNOSIS — Y92008 Other place in unspecified non-institutional (private) residence as the place of occurrence of the external cause: Secondary | ICD-10-CM | POA: Insufficient documentation

## 2017-10-14 DIAGNOSIS — Y93E9 Activity, other interior property and clothing maintenance: Secondary | ICD-10-CM | POA: Insufficient documentation

## 2017-10-14 DIAGNOSIS — F17220 Nicotine dependence, chewing tobacco, uncomplicated: Secondary | ICD-10-CM | POA: Insufficient documentation

## 2017-10-14 MED ORDER — LIDOCAINE-EPINEPHRINE (PF) 2 %-1:200000 IJ SOLN
20.0000 mL | Freq: Once | INTRAMUSCULAR | Status: AC
  Administered 2017-10-14: 20 mL
  Filled 2017-10-14: qty 20

## 2017-10-14 MED ORDER — TETANUS-DIPHTH-ACELL PERTUSSIS 5-2.5-18.5 LF-MCG/0.5 IM SUSP
0.5000 mL | Freq: Once | INTRAMUSCULAR | Status: AC
  Administered 2017-10-14: 0.5 mL via INTRAMUSCULAR
  Filled 2017-10-14: qty 0.5

## 2017-10-14 NOTE — Telephone Encounter (Signed)
Left message for patient to call and schedule CVRR appt to discuss alternative anticoagulants

## 2017-10-14 NOTE — Discharge Instructions (Addendum)
You have 7 stitches in your arm. Keep the current dressing on and dry for the next 24 hours (remove Sunday morning). After this, you can clean with soap and water.  You will need to follow-up with your PCP or at an urgent care in 5-7 days to have wound re-checked and stitches removed.   Follow-up with a medical provider if you have one or more of the following symptoms: fever; increased redness, warmth or tenderness at the wound site; pain in joints beyond where the initial wound was; unusual discharge.  Thank you for allowing [place] to take care of you today! Be safe this weekend.

## 2017-10-14 NOTE — ED Triage Notes (Signed)
Pt has a laceration to his right forearm. Pt was climbing out of the attic and cut it on a metal plate.  Pt reports he does need a tetanus shot

## 2017-10-14 NOTE — ED Provider Notes (Signed)
[facility]-EMERGENCY DEPT Provider Note  CSN: [id number] Arrival date & time: 10/14/17  2003  History   Chief Complaint Chief Complaint  Patient presents with  . Laceration    HPI [name] Holland is a 47 y.o. male with a medical history of DVT, MI, CVA and numerous other medical conditions who presented to the ED for forearm laceration. Patient states that he sustained this injury while cleaning out his attic and cut his arm on a metal plate. Denies paresthesias, weakness or color or temperature change in the affected area. Bleeding was controlled prior to arrival. Patient is on an anticoagulant for DVT treatment.  Past Medical History:  Diagnosis Date  . Acute deep vein thrombosis (DVT) of femoral vein (HCC)    dVT  . Arthritis    "knees" (11/23/2016)  . Clotting disorder (HCC)   . Cocaine abuse in remission (HCC) 2014  . Coronary artery disease due to lipid rich plaque   . DVT (deep venous thrombosis) (HCC) 05/2016   LLE  . 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 Hyperlipidemia   . Intracranial and intraspinal phlebitis and thrombophlebitis 05/31/2014  . Myocardial infarction (HCC) 2016   "caused by blood clot in the left side of my head"  . Stroke (HCC) 05/2014   denies residual on 11/23/2016  . Type II diabetes mellitus [facility])     Patient Active Problem List   Diagnosis Date Noted  . Unstable angina (HCC)   . Chest pain 07/17/2017  . Coronary artery disease due to lipid rich plaque   . Non-ST elevation (NSTEMI) myocardial infarction (HCC)   . History of DVT (deep vein thrombosis) 11/23/2016  . 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 Long term current use of anticoagulant 06/17/2016  . Mixed hyperlipidemia 05/31/2016  . Bilateral primary osteoarthritis of knee 04/21/2015  . Cocaine abuse in remission (HCC) 04/21/2015  . Obesity (BMI 30-39.9)   . CVA (cerebral infarction)  05/31/2014  . 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 CARDIAC CATHETERIZATION  2008  . CORONARY STENT INTERVENTION N/A 11/24/2016   Procedure: CORONARY STENT INTERVENTION;  Surgeon: Kathleene Hazel, MD;  [facility]: [facility] INV[facility];  Service: Cardiovascular;  Laterality: N/A;  . CORONARY THROMBECTOMY N/A 11/24/2016   Procedure: Coronary Thrombectomy;  Surgeon: Kathleene Hazel, MD;  Location: [facility];  Service: Cardiovascular;  Laterality: N/A;  . HIP SURGERY Right   . KNEE ARTHROSCOPY Bilateral early 2000s  . LEFT HEART CATH AND CORONARY ANGIOGRAPHY N/A 11/24/2016   Procedure: LEFT HEART CATH AND CORONARY ANGIOGRAPHY;  Surgeon: Kathleene Hazel, MD;  [facility]: [facility];  Service: Cardiovascular;  Laterality: N/A;  . LEFT HEART CATH AND CORONARY ANGIOGRAPHY N/A 07/18/2017   Procedure: LEFT HEART CATH AND CORONARY ANGIOGRAPHY;  Surgeon: Yvonne Kendall, MD;  Location: [facility] INV[facility];  Service: Cardiovascular;  Laterality: N/A;  . WRIST SURGERY Right         Home Medications    Prior to Admission medications   Medication Sig Start Date End Date Taking? Authorizing Provider  atorvastatin (LIPITOR) 80 MG tablet Take 1 tablet (80 mg total) by mouth daily at 6 PM. 07/19/17   Allayne Butcher, PA-C  clopidogrel (PLAVIX) 75  MG tablet Take 1 tablet (75 mg total) by mouth daily. 07/19/17   Robbie Lis M, PA-C  diclofenac sodium (VOLTAREN) 1 % GEL Apply 4 g topically 4 (four) times daily. 09/02/17   Maczis, Elmer Sow, PA-C  isosorbide mononitrate (IMDUR) 30 MG 24 hr tablet Take 0.5 tablets (15 mg total) by mouth daily. 07/20/17   Robbie Lis M, PA-C  lidocaine (LIDODERM) 5 % Place 1 patch onto the skin  daily. Remove & Discard patch within 12 hours or as directed by MD 09/02/17   Maczis, Elmer Sow, PA-C  methocarbamol (ROBAXIN) 500 MG tablet Take 1 tablet (500 mg total) by mouth 2 (two) times daily. 09/02/17   Maczis, Elmer Sow, PA-C  nitroGLYCERIN (NITROSTAT) 0.4 MG SL tablet Place 1 tablet (0.4 mg total) under the tongue every 5 (five) minutes as needed for chest pain. Patient not taking: Reported on 07/17/2017 05/11/17 08/09/17  Jodelle Gross, NP  predniSONE (DELTASONE) 10 MG tablet Take 4 tablets (40 mg total) by mouth daily with breakfast. 09/02/17   Maczis, Elmer Sow, PA-C  rivaroxaban (XARELTO) 20 MG TABS tablet Take 1 tablet (20 mg total) by mouth daily with supper. Patient taking differently: Take 20 mg by mouth every morning.  07/19/17   Allayne Butcher, PA-C    Family History Family History  Problem Relation Age of Onset  . Other Mother   . Diabetes type II Mother   . Asthma Mother   . Diabetes Mother   . CAD Father        CABG  . Hypertension Father   . Hyperlipidemia Father   . Pancreatic cancer Maternal Aunt   . Lung cancer Maternal Aunt   . CAD Maternal Grandmother   . Cerebral aneurysm Paternal Grandmother   . Cancer Paternal Grandfather        unknown    Social History Social History   Tobacco Use  . Smoking status: Former Smoker    Packs/day: 1.00    Years: 24.00    Pack years: 24.00    Types: Cigarettes    Last attempt to quit: 2014    Years since quitting: 5.7  . Smokeless tobacco: Current User    Types: Snuff  Substance Use Topics  . Alcohol use: No  . Drug use: No    Types: Cocaine    Comment: 11/23/2016 "tx for cocaine abuse in 2014"     Allergies   Patient has no known allergies.   Review of Systems Review of Systems  Constitutional: Negative.   Musculoskeletal: Negative.   Skin: Positive for wound.  Neurological: Negative for weakness and numbness.  Hematological: Bruises/bleeds easily.     Physical Exam Updated Vital  Signs BP (!) 148/97 (BP Location: Left Arm)   Pulse 78   Temp 98.1 F (36.7 C) (Oral)   Resp 18   Ht 6\' 1"  (1.854 m)   Wt 126.1 kg   SpO2 96%   BMI 36.68 kg/m   Physical Exam  Constitutional: He appears well-developed and well-nourished. He is cooperative.  Musculoskeletal:       Right forearm: He exhibits laceration.  Full ROM of upper extremities with 5/5 strength.  Neurological: He is alert. He has normal strength. He displays no atrophy. No sensory deficit. He exhibits normal muscle tone.  Reflex Scores:      Bicep reflexes are 2+ on the right side and 2+ on the left side.      Brachioradialis reflexes are 2+ on the right  side and 2+ on the left side. Skin: Skin is warm. Capillary refill takes less than 2 seconds.     Nursing note and vitals reviewed.  ED Treatments / Results  Labs (all labs ordered are listed, but only abnormal results are displayed) Labs Reviewed - No data to display  EKG None  Radiology No results found.  Procedures .Marland KitchenLaceration Repair Date/Time: 10/14/2017 9:06 PM Performed by: Windy Carina, PA-C Authorized by: Windy Carina, PA-C   Consent:    Consent obtained:  Verbal   Consent given by:  Patient   Risks discussed:  Infection, pain, poor cosmetic result, poor wound healing, tendon damage and nerve damage   Alternatives discussed:  No treatment Anesthesia (see MAR for exact dosages):    Anesthesia method:  Local infiltration   Local anesthetic:  Lidocaine 2% WITH epi Laceration details:    Location:  Shoulder/arm   Shoulder/arm location:  R lower arm   Length (cm):  5   Depth (mm):  4 Repair type:    Repair type:  Simple Pre-procedure details:    Preparation:  Patient was prepped and draped in usual sterile fashion Exploration:    Hemostasis achieved with:  Direct pressure   Wound exploration: wound explored through full range of motion and entire depth of wound probed and visualized     Wound extent: no fascia  violation noted, no nerve damage noted, no tendon damage noted and no vascular damage noted     Contaminated: no   Treatment:    Area cleansed with:  Betadine   Amount of cleaning:  Standard Skin repair:    Repair method:  Sutures   Suture size:  3-0   Suture material:  Prolene   Suture technique:  Simple interrupted   Number of sutures:  7 Approximation:    Approximation:  Close Post-procedure details:    Dressing:  Sterile dressing   Patient tolerance of procedure:  Tolerated well, no immediate complications Comments:     Performed by Gershon Crane, PA-S. Dagoberto Ligas, PA-C supervised the entire procedure.   (including critical care time)  Medications Ordered in ED Medications - No data to display   Initial Impression / Assessment and Plan / ED Course  Triage vital signs and the nursing notes have been reviewed.  Pertinent labs & imaging results that were available during care of the patient were reviewed and considered in medical decision making (see chart for details).   Patient presents with right forearm laceration that he cut on metal. Compartment is soft and neurovascular function and ROM intact. Bleeding is well-controlled despite patient being on Xarelto for DVT. Wound thoroughly cleaned and no foreign bodies visualized. Laceration repaired without complications.  Final Clinical Impressions(s) / ED Diagnoses  1. Right Forearm Laceration. Repaired with 7 sutures without complication. Tdap booster given. Education provided on wound care, follow-up and s/s of infection.  Dispo: Home. After thorough clinical evaluation, this patient is determined to be medically stable and can be safely discharged with the previously mentioned treatment and/or outpatient follow-up/referral(s). At this time, there are no other apparent medical conditions that require further screening, evaluation or treatment.   Final diagnoses:  Laceration of right forearm, initial encounter    ED  Discharge Orders    None        Reva Bores 10/14/17 2132    Jacalyn Lefevre, MD 10/14/17 2213

## 2017-10-18 NOTE — Telephone Encounter (Signed)
Left message for patient to call and schedule visit with [facility] to discuss alternative anticoagulants.

## 2017-10-19 NOTE — Telephone Encounter (Signed)
Left message for patient to call and schedule [facility] appointment  

## 2017-10-21 ENCOUNTER — Other Ambulatory Visit: Payer: Self-pay

## 2017-10-21 ENCOUNTER — Emergency Department (HOSPITAL_BASED_OUTPATIENT_CLINIC_OR_DEPARTMENT_OTHER)
Admission: EM | Admit: 2017-10-21 | Discharge: 2017-10-21 | Disposition: A | Discharge status: Home / Self Care | Payer: Self-pay | Attending: Emergency Medicine | Admitting: Emergency Medicine

## 2017-10-21 ENCOUNTER — Encounter (HOSPITAL_BASED_OUTPATIENT_CLINIC_OR_DEPARTMENT_OTHER): Payer: Self-pay

## 2017-10-21 DIAGNOSIS — I252 Old myocardial infarction: Secondary | ICD-10-CM | POA: Insufficient documentation

## 2017-10-21 DIAGNOSIS — Z7901 Long term (current) use of anticoagulants: Secondary | ICD-10-CM | POA: Insufficient documentation

## 2017-10-21 DIAGNOSIS — S51811D Laceration without foreign body of right forearm, subsequent encounter: Secondary | ICD-10-CM | POA: Insufficient documentation

## 2017-10-21 DIAGNOSIS — Z79899 Other long term (current) drug therapy: Secondary | ICD-10-CM | POA: Insufficient documentation

## 2017-10-21 DIAGNOSIS — Z8673 Personal history of transient ischemic attack (TIA), and cerebral infarction without residual deficits: Secondary | ICD-10-CM | POA: Insufficient documentation

## 2017-10-21 DIAGNOSIS — Z955 Presence of coronary angioplasty implant and graft: Secondary | ICD-10-CM | POA: Insufficient documentation

## 2017-10-21 DIAGNOSIS — Z7902 Long term (current) use of antithrombotics/antiplatelets: Secondary | ICD-10-CM | POA: Insufficient documentation

## 2017-10-21 DIAGNOSIS — Z5189 Encounter for other specified aftercare: Secondary | ICD-10-CM | POA: Insufficient documentation

## 2017-10-21 DIAGNOSIS — X58XXXD Exposure to other specified factors, subsequent encounter: Secondary | ICD-10-CM | POA: Insufficient documentation

## 2017-10-21 DIAGNOSIS — E119 Type 2 diabetes mellitus without complications: Secondary | ICD-10-CM | POA: Insufficient documentation

## 2017-10-21 DIAGNOSIS — F17228 Nicotine dependence, chewing tobacco, with other nicotine-induced disorders: Secondary | ICD-10-CM | POA: Insufficient documentation

## 2017-10-21 MED ORDER — DOXYCYCLINE HYCLATE 100 MG PO CAPS: 100.0000 mg | ORAL_CAPSULE | Freq: Two times a day (BID) | ORAL | 0 refills | Status: AC

## 2017-10-21 MED FILL — DOXYCYCLINE HYCLATE 100 MG: 100 | 7 days supply | Qty: 14 | Fill #0

## 2017-10-21 NOTE — Discharge Instructions (Signed)
You were given a prescription for antibiotics. Please take the antibiotic prescription fully.   Please follow up with your primary care provider within 5-7 days for re-evaluation of your symptoms. If you do not have a primary care provider, information for a healthcare clinic has been provided for you to make arrangements for follow up care.  Please return to the emergency room immediately if you experience any new or worsening symptoms or any symptoms that indicate worsening infection such as fevers, increased redness/swelling/pain, warmth, or drainage from the affected area.    

## 2017-10-21 NOTE — ED Triage Notes (Addendum)
Pt c/o possible infection to right FA-a nurse friend removed sutures 2 days ago-states area popped open today-NAD-steady gait

## 2017-10-21 NOTE — ED Provider Notes (Signed)
[facility] EMERGENCY DEPARTMENT Provider Note   CSN: [id number] Arrival date & time: 10/21/17  1631     History   Chief Complaint Chief Complaint  Patient presents with  . Wound Check    HPI [name] Holland is a 47 y.o. male.  HPI  Patient is a 47 year old male with history of DVT, clotting disorder, CAD, cocaine abuse, hyperlipidemia, CVA, T2DM, who presents emergency department today for wound recheck.  Patient states that he had sutures placed on 10/14/2017.  He had a friend who is a nurse to remove the sutures 2 days ago.  Today he was working and felt a pop to his arm.  States he had some yellow/purulent drainage from the wound.  Denies any significant surrounding erythema or warmth.  No fevers or chills.  Has mild pain to the area.  Past Medical History:  Diagnosis Date  . Acute deep vein thrombosis (DVT) of femoral vein (HCC)    dVT  . Arthritis    "knees" (11/23/2016)  . Clotting disorder (HCC)   . Cocaine abuse in remission (HCC) 2014  . Coronary artery disease due to lipid rich plaque   . DVT (deep venous thrombosis) (HCC) 05/2016   LLE  . 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 Hyperlipidemia   . Intracranial and intraspinal phlebitis and thrombophlebitis 05/31/2014  . Myocardial infarction (HCC) 2016   "caused by blood clot in the left side of my head"  . Stroke (HCC) 05/2014   denies residual on 11/23/2016  . Type II diabetes mellitus [facility])     Patient Active Problem List   Diagnosis Date Noted  . Unstable angina (HCC)   . Chest pain 07/17/2017  . Coronary artery disease due to lipid rich plaque   . Non-ST elevation (NSTEMI) myocardial infarction (HCC)   . History of DVT (deep vein thrombosis) 11/23/2016  . 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 Long term current use of anticoagulant 06/17/2016  . Mixed hyperlipidemia 05/31/2016  . Bilateral primary osteoarthritis  of knee 04/21/2015  . Cocaine abuse in remission (HCC) 04/21/2015  . Obesity (BMI 30-39.9)   . CVA (cerebral infarction) 05/31/2014  . 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 CARDIAC CATHETERIZATION  2008  . CORONARY STENT INTERVENTION N/A 11/24/2016   Procedure: CORONARY STENT INTERVENTION;  Surgeon: Kathleene Hazel, MD;  Location: [facility];  Service: Cardiovascular;  Laterality: N/A;  . CORONARY THROMBECTOMY N/A 11/24/2016   Procedure: Coronary Thrombectomy;  Surgeon: Kathleene Hazel, MD;  Location: [facility];  Service: Cardiovascular;  Laterality: N/A;  . HIP SURGERY Right   . KNEE ARTHROSCOPY Bilateral early 2000s  . LEFT HEART CATH AND CORONARY ANGIOGRAPHY N/A 11/24/2016   Procedure: LEFT HEART CATH AND CORONARY ANGIOGRAPHY;  Surgeon: Kathleene Hazel, MD;  Location: [facility];  Service: Cardiovascular;  Laterality: N/A;  . LEFT HEART CATH AND CORONARY ANGIOGRAPHY N/A 07/18/2017   Procedure: LEFT HEART CATH AND CORONARY ANGIOGRAPHY;  Surgeon: Yvonne Kendall, MD;  Location: [facility] CV LAB;  Service: Cardiovascular;  Laterality: N/A;  . WRIST SURGERY Right         Home Medications    Prior to Admission medications   Medication Sig Start Date  End Date Taking? Authorizing Provider  atorvastatin (LIPITOR) 80 MG tablet Take 1 tablet (80 mg total) by mouth daily at 6 PM. 07/19/17   Allayne Butcher, PA-C  clopidogrel (PLAVIX) 75 MG tablet Take 1 tablet (75 mg total) by mouth daily. 07/19/17   Robbie Lis M, PA-C  diclofenac sodium (VOLTAREN) 1 % GEL Apply 4 g topically 4 (four) times daily. 09/02/17   Maczis, Elmer Sow, PA-C  doxycycline (VIBRAMYCIN) 100 MG capsule Take 1 capsule (100 mg  total) by mouth 2 (two) times daily for 7 days. 10/21/17 10/28/17  Denicia Pagliarulo S, PA-C  isosorbide mononitrate (IMDUR) 30 MG 24 hr tablet Take 0.5 tablets (15 mg total) by mouth daily. 07/20/17   Robbie Lis M, PA-C  lidocaine (LIDODERM) 5 % Place 1 patch onto the skin daily. Remove & Discard patch within 12 hours or as directed by MD 09/02/17   Maczis, Elmer Sow, PA-C  methocarbamol (ROBAXIN) 500 MG tablet Take 1 tablet (500 mg total) by mouth 2 (two) times daily. 09/02/17   Maczis, Elmer Sow, PA-C  nitroGLYCERIN (NITROSTAT) 0.4 MG SL tablet Place 1 tablet (0.4 mg total) under the tongue every 5 (five) minutes as needed for chest pain. Patient not taking: Reported on 07/17/2017 05/11/17 08/09/17  Jodelle Gross, NP  rivaroxaban (XARELTO) 20 MG TABS tablet Take 1 tablet (20 mg total) by mouth daily with supper. Patient taking differently: Take 20 mg by mouth every morning.  07/19/17   Allayne Butcher, PA-C    Family History Family History  Problem Relation Age of Onset  . Other Mother   . Diabetes type II Mother   . Asthma Mother   . Diabetes Mother   . CAD Father        CABG  . Hypertension Father   . Hyperlipidemia Father   . Pancreatic cancer Maternal Aunt   . Lung cancer Maternal Aunt   . CAD Maternal Grandmother   . Cerebral aneurysm Paternal Grandmother   . Cancer Paternal Grandfather        unknown    Social History Social History   Tobacco Use  . Smoking status: Former Smoker    Packs/day: 1.00    Years: 24.00    Pack years: 24.00    Types: Cigarettes    Last attempt to quit: 2014    Years since quitting: 5.7  . Smokeless tobacco: Current User    Types: Snuff  Substance Use Topics  . Alcohol use: No  . Drug use: No    Types: Cocaine     Allergies   Formaldehyde   Review of Systems Review of Systems  Constitutional: Negative for chills and fever.  Skin: Positive for wound.     Physical Exam Updated Vital Signs BP 134/79 (BP Location:  Left Arm)   Pulse 78   Temp 98.8 F (37.1 C) (Oral)   Resp 18   SpO2 98%   Physical Exam  Constitutional: He is oriented to person, place, and time. He appears well-developed and well-nourished. No distress.  Eyes: Conjunctivae are normal.  Cardiovascular: Normal rate.  Pulmonary/Chest: Effort normal.  Neurological: He is alert and oriented to person, place, and time.  Skin: Skin is warm and dry.  4cm curved laceration to the right forearm that appears to have dehisced. There is a small amount of serous drainage from the wound. There is some surrounding induration to the area, but no appreciable fluctuance, warmth, or erythema.  Psychiatric: He has a normal  mood and affect.    ED Treatments / Results  Labs (all labs ordered are listed, but only abnormal results are displayed) Labs Reviewed - No data to display  EKG None  Radiology No results found.  Procedures Procedures (including critical care time)  Medications Ordered in ED Medications - No data to display   Initial Impression / Assessment and Plan / ED Course  I have reviewed the triage vital signs and the nursing notes.  Pertinent labs & imaging results that were available during my care of the patient were reviewed by me and considered in my medical decision making (see chart for details).   Final Clinical Impressions(s) / ED Diagnoses   Final diagnoses:  Visit for wound check   Pt with recent laceration repair with sutures 7 days ago. Had sutures removed by his friend 2 days ago. Today felt a pop to wound and had some drainage. Wound appears dehisced with some serous drainage. Given reports of some purulent drainage with cover for potential infection. No appreciable fluctuance on exam. Discussed home care and return precautions, pt advised to continue all antibiotics until they are gone. Advised on return precautions. Pt voices understanding of plan. All questions answered.   ED Discharge Orders          Ordered    doxycycline (VIBRAMYCIN) 100 MG capsule  2 times daily     10/21/17 1746           Andalyn Heckstall S, PA-C 10/21/17 1746    Tilden Fossa, MD 10/22/17 0140

## 2017-10-27 ENCOUNTER — Ambulatory Visit (INDEPENDENT_AMBULATORY_CARE_PROVIDER_SITE_OTHER): Payer: Self-pay | Admitting: Pharmacist

## 2017-10-27 DIAGNOSIS — Z7901 Long term (current) use of anticoagulants: Secondary | ICD-10-CM

## 2017-10-27 NOTE — Progress Notes (Signed)
  Anticoagulation: Pt was started on Xarelto for history of DVT and CVA, and thrombectomy. Current therapy includes Xarelto and Plavix daily. He presents to clinic to discuss available anticoagulation options. Patient is currently between Rx insurance coverage and is not able to afford Xarelto for next 2 months. No side effects or  bleeding noted.   Reviewed patients medication list.  Patient is not currently on any combined P-gp and strong CYP3A4 inhibitors/inducers (ketoconazole, traconazole, ritonavir, carbamazepine, phenytoin, rifampin, St. John's wort).  Reviewed labs. Dose remains appropriate based on CrCl > 50 and Hgb/HCT within normal limits.  Assessment /Plan: Patient is stable on Xarelto and clopidogrel therapy. He is unable to afford Xarelto until January due to change in medical insurance. Patient reports enough supply for 10 more days and new Rx insurance starting January/1st. Warfarin monitoring will represent a problems with his work schedule and will be needed only for next 2 months.  Seven weeks of Xarelto samples were provided today during office visit. This supply plus current home supply should provide coverage until new insurance is active without need to change stable therapy.  Patientr was encouraged to contact our clinic any problems with his anticoagulation therapy or to change therapy to warfarin if needed.  Mio Schellinger Rodriguez-Guzman PharmD, BCPS, CPP [facility] [address] [place] [id number] 10/28/2017 11:18 AM

## 2017-10-28 ENCOUNTER — Encounter: Payer: Self-pay | Admitting: Pharmacist

## 2017-12-26 ENCOUNTER — Other Ambulatory Visit: Payer: Self-pay

## 2017-12-26 ENCOUNTER — Emergency Department (HOSPITAL_BASED_OUTPATIENT_CLINIC_OR_DEPARTMENT_OTHER): Payer: Self-pay

## 2017-12-26 ENCOUNTER — Encounter (HOSPITAL_BASED_OUTPATIENT_CLINIC_OR_DEPARTMENT_OTHER): Payer: Self-pay | Admitting: *Deleted

## 2017-12-26 ENCOUNTER — Emergency Department (HOSPITAL_BASED_OUTPATIENT_CLINIC_OR_DEPARTMENT_OTHER)
Admission: EM | Admit: 2017-12-26 | Discharge: 2017-12-27 | Disposition: A | Discharge status: Home / Self Care | Payer: Self-pay | Attending: Emergency Medicine | Admitting: Emergency Medicine

## 2017-12-26 DIAGNOSIS — F17290 Nicotine dependence, other tobacco product, uncomplicated: Secondary | ICD-10-CM | POA: Insufficient documentation

## 2017-12-26 DIAGNOSIS — I25110 Atherosclerotic heart disease of native coronary artery with unstable angina pectoris: Secondary | ICD-10-CM | POA: Insufficient documentation

## 2017-12-26 DIAGNOSIS — Z8673 Personal history of transient ischemic attack (TIA), and cerebral infarction without residual deficits: Secondary | ICD-10-CM | POA: Insufficient documentation

## 2017-12-26 DIAGNOSIS — Z7901 Long term (current) use of anticoagulants: Secondary | ICD-10-CM | POA: Insufficient documentation

## 2017-12-26 DIAGNOSIS — Z79899 Other long term (current) drug therapy: Secondary | ICD-10-CM | POA: Insufficient documentation

## 2017-12-26 DIAGNOSIS — K529 Noninfective gastroenteritis and colitis, unspecified: Secondary | ICD-10-CM | POA: Insufficient documentation

## 2017-12-26 DIAGNOSIS — I252 Old myocardial infarction: Secondary | ICD-10-CM | POA: Insufficient documentation

## 2017-12-26 LAB — COMPREHENSIVE METABOLIC PANEL
ALBUMIN: 4.5 g/dL (ref 3.5–5.0)
ALT: 33 U/L (ref 0–44)
ANION GAP: 9 (ref 5–15)
AST: 24 U/L (ref 15–41)
Alkaline Phosphatase: 65 U/L (ref 38–126)
BILIRUBIN TOTAL: 0.9 mg/dL (ref 0.3–1.2)
BUN: 21 mg/dL — AB (ref 6–20)
CO2: 24 mmol/L (ref 22–32)
Calcium: 9.1 mg/dL (ref 8.9–10.3)
Chloride: 106 mmol/L (ref 98–111)
Creatinine, Ser: 0.92 mg/dL (ref 0.61–1.24)
GFR calc Af Amer: >60 mL/min (ref >60)
GLUCOSE: 94 mg/dL (ref 70–99)
POTASSIUM: 4.2 mmol/L (ref 3.5–5.1)
Sodium: 139 mmol/L (ref 135–145)
TOTAL PROTEIN: 7.9 g/dL (ref 6.5–8.1)

## 2017-12-26 LAB — URINALYSIS, MICROSCOPIC (REFLEX)

## 2017-12-26 LAB — CBC
HCT: 50.5 % (ref 39.0–52.0)
HEMOGLOBIN: 16.1 g/dL (ref 13.0–17.0)
MCH: 28.6 pg (ref 26.0–34.0)
MCHC: 31.9 g/dL (ref 30.0–36.0)
MCV: 89.9 fL (ref 80.0–100.0)
Platelets: 166 10*3/uL (ref 150–400)
RBC: 5.62 MIL/uL (ref 4.22–5.81)
RDW: 13.7 % (ref 11.5–15.5)
WBC: 11.6 10*3/uL — AB (ref 4.0–10.5)
nRBC: 0 % (ref 0.0–0.2)

## 2017-12-26 LAB — URINALYSIS, ROUTINE W REFLEX MICROSCOPIC
Bilirubin Urine: NEGATIVE
Glucose, UA: NEGATIVE mg/dL
Ketones, ur: NEGATIVE mg/dL
Leukocytes, UA: NEGATIVE
NITRITE: NEGATIVE
PH: 5.5 (ref 5.0–8.0)
Protein, ur: NEGATIVE mg/dL
SPECIFIC GRAVITY, URINE: 1.025 (ref 1.005–1.030)

## 2017-12-26 LAB — LIPASE, BLOOD: LIPASE: 26 U/L (ref 11–51)

## 2017-12-26 MED ORDER — MORPHINE SULFATE (PF) 4 MG/ML IV SOLN
4.0000 mg | Freq: Once | INTRAVENOUS | Status: AC
  Administered 2017-12-26: 4 mg via INTRAVENOUS
  Filled 2017-12-26: qty 1

## 2017-12-26 MED ORDER — IOPAMIDOL (ISOVUE-300) INJECTION 61%
125.0000 mL | Freq: Once | INTRAVENOUS | Status: AC | PRN
  Administered 2017-12-26: 125 mL via INTRAVENOUS

## 2017-12-26 MED ORDER — ONDANSETRON HCL 4 MG/2ML IJ SOLN
4.0000 mg | Freq: Once | INTRAMUSCULAR | Status: AC
Start: 2017-12-26 — End: 2017-12-26
  Administered 2017-12-26: 4 mg via INTRAVENOUS
  Filled 2017-12-26: qty 2

## 2017-12-26 NOTE — ED Notes (Signed)
ED Provider at bedside. 

## 2017-12-26 NOTE — ED Triage Notes (Signed)
Abdominal pain. Bloating. Nausea.

## 2017-12-27 MED ORDER — CIPROFLOXACIN HCL 500 MG PO TABS
500.0000 mg | ORAL_TABLET | Freq: Once | ORAL | Status: AC
  Administered 2017-12-27: 500 mg via ORAL
  Filled 2017-12-27: qty 1

## 2017-12-27 MED ORDER — CIPROFLOXACIN HCL 500 MG PO TABS: 500.0000 mg | ORAL_TABLET | Freq: Two times a day (BID) | ORAL | 0 refills | Status: AC

## 2017-12-27 MED ORDER — HYDROCODONE-ACETAMINOPHEN 5-325 MG PO TABS: 1.0000 | ORAL_TABLET | Freq: Four times a day (QID) | ORAL | 0 refills | Status: DC | PRN

## 2017-12-27 MED ORDER — METRONIDAZOLE 500 MG PO TABS
500.0000 mg | ORAL_TABLET | Freq: Once | ORAL | Status: AC
  Administered 2017-12-27: 500 mg via ORAL
  Filled 2017-12-27: qty 1

## 2017-12-27 MED ORDER — METRONIDAZOLE 500 MG PO TABS: 500.0000 mg | ORAL_TABLET | Freq: Two times a day (BID) | ORAL | 0 refills | Status: AC

## 2017-12-27 MED ORDER — ONDANSETRON 4 MG PO TBDP: 4.0000 mg | ORAL_TABLET | Freq: Three times a day (TID) | ORAL | 0 refills | Status: DC | PRN

## 2017-12-27 MED ORDER — MORPHINE SULFATE (PF) 4 MG/ML IV SOLN
4.0000 mg | Freq: Once | INTRAVENOUS | Status: AC
  Administered 2017-12-27: 4 mg via INTRAVENOUS
  Filled 2017-12-27: qty 1

## 2017-12-27 NOTE — Discharge Instructions (Signed)
Take antibiotics as prescribed.  Take the entire course, even if your symptoms improve. Use Tylenol as needed for mild to moderate pain.  Use Norco as needed for severe breakthrough pain.  Have caution while taking this medicine, as is a narcotic.  Do not drive or operate heavy machinery while taking this medicine. Use Zofran as needed for nausea or vomiting. Follow-up with your primary care doctor as needed for further evaluation of your symptoms. Return to the emergency room if you develop persistent despite medication, severe worsening abdominal pain, blood in your stool, or any new, worsening, or concerning symptoms.

## 2017-12-27 NOTE — ED Provider Notes (Signed)
[facility] EMERGENCY DEPARTMENT Provider Note   CSN: [id number] Arrival date & time: 12/26/17  1800     History   Chief Complaint Chief Complaint  Patient presents with  . Abdominal Pain    HPI [name] Holland is a 47 y.o. male presenting for evaluation of abdominal pain, nausea and diarrhea.  Patient states for the past 5 days, he has been having persistent nausea, diarrhea, and abdominal pain.  Today he has had at least 6 bowel movements, none are bloody. Pt states pain is constant, worse after eating. He denies h/o similar. Has no h/o abd surgeries. Son is sick with URI.  Patient has a history of MI s/p stent placement, h/o clots on eliquis and plavix, DM, HLD, CVA. He repots low grade subjective fevers. Denies CP, SOB, vomiting, urinary sxs.   HPI  Past Medical History:  Diagnosis Date  . Acute deep vein thrombosis (DVT) of femoral vein (HCC)    dVT  . Arthritis    "knees" (11/23/2016)  . Clotting disorder (HCC)   . Cocaine abuse in remission (HCC) 2014  . Coronary artery disease due to lipid rich plaque   . DVT (deep venous thrombosis) (HCC) 05/2016   LLE  . 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 Hyperlipidemia   . Intracranial and intraspinal phlebitis and thrombophlebitis 05/31/2014  . Myocardial infarction (HCC) 2016   "caused by blood clot in the left side of my head"  . Stroke (HCC) 05/2014   denies residual on 11/23/2016  . Type II diabetes mellitus [facility])     Patient Active Problem List   Diagnosis Date Noted  . Unstable angina (HCC)   . Chest pain 07/17/2017  . Coronary artery disease due to lipid rich plaque   . Non-ST elevation (NSTEMI) myocardial infarction (HCC)   . History of DVT (deep vein thrombosis) 11/23/2016  . 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 Long term current use of anticoagulant 06/17/2016  . Mixed hyperlipidemia 05/31/2016  . Bilateral  primary osteoarthritis of knee 04/21/2015  . Cocaine abuse in remission (HCC) 04/21/2015  . Obesity (BMI 30-39.9)   . CVA (cerebral infarction) 05/31/2014  . 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 CARDIAC CATHETERIZATION  2008  . CORONARY STENT INTERVENTION N/A 11/24/2016   Procedure: CORONARY STENT INTERVENTION;  Surgeon: Kathleene Hazel, MD;  Location: [facility] INVASIVE CV LAB;  Service: Cardiovascular;  Laterality: N/A;  . CORONARY THROMBECTOMY N/A 11/24/2016   Procedure: Coronary Thrombectomy;  Surgeon: Kathleene Hazel, MD;  Location: [facility] INVASIVE CV LAB;  Service: Cardiovascular;  Laterality: N/A;  . HIP SURGERY Right   . KNEE ARTHROSCOPY Bilateral early 2000s  . LEFT HEART CATH AND CORONARY ANGIOGRAPHY N/A 11/24/2016   Procedure: LEFT HEART CATH AND CORONARY ANGIOGRAPHY;  Surgeon: Kathleene Hazel, MD;  Location: [facility] INVASIVE CV LAB;  Service: Cardiovascular;  Laterality: N/A;  . LEFT HEART CATH AND CORONARY ANGIOGRAPHY N/A 07/18/2017   Procedure: LEFT HEART CATH AND CORONARY ANGIOGRAPHY;  Surgeon: Yvonne Kendall, MD;  Location: [facility] INVASIVE CV LAB;  Service: Cardiovascular;  Laterality: N/A;  . WRIST SURGERY Right         Home Medications    Prior to Admission medications   Medication Sig Start  Date End Date Taking? Authorizing Provider  atorvastatin (LIPITOR) 80 MG tablet Take 1 tablet (80 mg total) by mouth daily at 6 PM. 07/19/17   Allayne Butcher, PA-C  ciprofloxacin (CIPRO) 500 MG tablet Take 1 tablet (500 mg total) by mouth every 12 (twelve) hours for 7 days. 12/27/17 01/03/18  Cristan Hout, PA-C  clopidogrel (PLAVIX) 75 MG tablet Take 1 tablet (75 mg total) by mouth daily. 07/19/17   Robbie Lis M, PA-C    diclofenac sodium (VOLTAREN) 1 % GEL Apply 4 g topically 4 (four) times daily. 09/02/17   Maczis, Elmer Sow, PA-C  HYDROcodone-acetaminophen (NORCO/VICODIN) 5-325 MG tablet Take 1 tablet by mouth every 6 (six) hours as needed for severe pain. 12/27/17   Khloey Chern, PA-C  isosorbide mononitrate (IMDUR) 30 MG 24 hr tablet Take 0.5 tablets (15 mg total) by mouth daily. 07/20/17   Robbie Lis M, PA-C  lidocaine (LIDODERM) 5 % Place 1 patch onto the skin daily. Remove & Discard patch within 12 hours or as directed by MD 09/02/17   Maczis, Elmer Sow, PA-C  methocarbamol (ROBAXIN) 500 MG tablet Take 1 tablet (500 mg total) by mouth 2 (two) times daily. 09/02/17   Maczis, Elmer Sow, PA-C  metroNIDAZOLE (FLAGYL) 500 MG tablet Take 1 tablet (500 mg total) by mouth 2 (two) times daily for 7 days. 12/27/17 01/03/18  Briceida Rasberry, PA-C  nitroGLYCERIN (NITROSTAT) 0.4 MG SL tablet Place 1 tablet (0.4 mg total) under the tongue every 5 (five) minutes as needed for chest pain. Patient not taking: Reported on 07/17/2017 05/11/17 08/09/17  Jodelle Gross, NP  ondansetron (ZOFRAN ODT) 4 MG disintegrating tablet Take 1 tablet (4 mg total) by mouth every 8 (eight) hours as needed for nausea or vomiting. 12/27/17   Billey Wojciak, PA-C  rivaroxaban (XARELTO) 20 MG TABS tablet Take 1 tablet (20 mg total) by mouth daily with supper. Patient taking differently: Take 20 mg by mouth every morning.  07/19/17   Allayne Butcher, PA-C    Family History Family History  Problem Relation Age of Onset  . Other Mother   . Diabetes type II Mother   . Asthma Mother   . Diabetes Mother   . CAD Father        CABG  . Hypertension Father   . Hyperlipidemia Father   . Pancreatic cancer Maternal Aunt   . Lung cancer Maternal Aunt   . CAD Maternal Grandmother   . Cerebral aneurysm Paternal Grandmother   . Cancer Paternal Grandfather        unknown    Social History Social History   Tobacco Use  .  Smoking status: Former Smoker    Packs/day: 1.00    Years: 24.00    Pack years: 24.00    Types: Cigarettes    Last attempt to quit: 2014    Years since quitting: 5.9  . Smokeless tobacco: Current User    Types: Snuff  Substance Use Topics  . Alcohol use: No  . Drug use: No    Types: Cocaine     Allergies   Formaldehyde   Review of Systems Review of Systems  Constitutional: Positive for fever.  Gastrointestinal: Positive for abdominal pain, diarrhea and nausea.  All other systems reviewed and are negative.    Physical Exam Updated Vital Signs BP 104/61 (BP Location: Right Arm)   Pulse 85   Temp 98.8 F (37.1 C) (Oral)   Resp 18   Ht 6\' 1"  (1.854 m)  Wt 133.4 kg   SpO2 96%   BMI 38.79 kg/m   Physical Exam Vitals signs and nursing note reviewed.  Constitutional:      General: He is not in acute distress.    Appearance: He is well-developed.     Comments: Patient in the bed in no acute distress  HENT:     Head: Normocephalic and atraumatic.     Comments: MM moist Eyes:     Conjunctiva/sclera: Conjunctivae normal.     Pupils: Pupils are equal, round, and reactive to light.  Neck:     Musculoskeletal: Normal range of motion and neck supple.  Cardiovascular:     Rate and Rhythm: Normal rate and regular rhythm.  Pulmonary:     Effort: Pulmonary effort is normal. No respiratory distress.     Breath sounds: Normal breath sounds. No wheezing.  Abdominal:     General: Bowel sounds are normal. There is no distension.     Palpations: Abdomen is soft.     Tenderness: There is abdominal tenderness. Negative signs include Murphy's sign.     Comments: Denies tenderness palpation the abdomen, worse in the left and right lower quadrants.  No rigidity, guarding, distention.  Negative rebound.  Negative Murphy's.  Musculoskeletal: Normal range of motion.  Skin:    General: Skin is warm and dry.     Capillary Refill: Capillary refill takes less than 2 seconds.   Neurological:     Mental Status: He is alert and oriented to person, place, and time.      ED Treatments / Results  Labs (all labs ordered are listed, but only abnormal results are displayed) Labs Reviewed  COMPREHENSIVE METABOLIC PANEL - Abnormal; Notable for the following components:      Result Value   BUN 21 (*)    All other components within normal limits  CBC - Abnormal; Notable for the following components:   WBC 11.6 (*)    All other components within normal limits  URINALYSIS, ROUTINE W REFLEX MICROSCOPIC - Abnormal; Notable for the following components:   Hgb urine dipstick MODERATE (*)    All other components within normal limits  URINALYSIS, MICROSCOPIC (REFLEX) - Abnormal; Notable for the following components:   Bacteria, UA FEW (*)    All other components within normal limits  LIPASE, BLOOD    EKG None  Radiology Ct Abdomen Pelvis W Contrast  Result Date: 12/27/2017 CLINICAL DATA:  Bloating, lower abdominal pain for 4 days. Hematuria. History of clotting disorder, hip surgery. EXAM: CT ABDOMEN AND PELVIS WITH CONTRAST TECHNIQUE: Multidetector CT imaging of the abdomen and pelvis was performed using the standard protocol following bolus administration of intravenous contrast. CONTRAST:  ISOVUE-300 IOPAMIDOL (ISOVUE-300) INJECTION 61% COMPARISON:  CT abdomen and pelvis May 28, 2013 FINDINGS: LOWER CHEST: Tree-in-bud infiltrates included RIGHT upper lobe, image 1/34. heart size is normal. Coronary artery stent. No pericardial effusion. HEPATOBILIARY: Liver and gallbladder are normal. PANCREAS: Normal. SPLEEN: Similar mild splenomegaly. ADRENALS/URINARY TRACT: Kidneys are orthotopic, demonstrating symmetric enhancement. No nephrolithiasis, hydronephrosis or solid renal masses. Too small to characterize hypodensities LEFT kidney. The unopacified ureters are normal in course and caliber. Delayed imaging through the kidneys demonstrates symmetric prompt contrast  excretion within the proximal urinary collecting system. Urinary bladder is partially distended and unremarkable. Normal adrenal glands. STOMACH/BOWEL: The stomach, small and large bowel are normal in course and caliber without inflammatory changes. Colonic air-fluid levels. Tiny duodenal air-filled diverticulum. Normal appendix. VASCULAR/LYMPHATIC: Aortoiliac vessels are normal in course  and caliber. Mild calcific atherosclerosis. No lymphadenopathy by CT size criteria. REPRODUCTIVE: Normal. OTHER: No intraperitoneal free fluid or free air. Moderate bilateral fat containing inguinal hernias. MUSCULOSKELETAL: Nonacute. Severe L4-5 and L5-S1 disc height loss with vacuum disc and endplate spurring consistent with degenerative discs. Severe RIGHT L5-S1 neural foraminal narrowing. IMPRESSION: 1. Partially imaged RIGHT upper lobe tree-in-bud infiltrates which may be infectious or inflammatory. 2. Colonic air-fluid levels seen with enteritis. 3. Chronic mild splenomegaly. Aortic Atherosclerosis (ICD10-I70.0). Electronically Signed   By: Awilda Metro M.D.   On: 12/27/2017 00:12    Procedures Procedures (including critical care time)  Medications Ordered in ED Medications  ondansetron (ZOFRAN) injection 4 mg (4 mg Intravenous Given 12/26/17 2057)  morphine 4 MG/ML injection 4 mg (4 mg Intravenous Given 12/26/17 2057)  iopamidol (ISOVUE-300) 61 % injection 125 mL (125 mLs Intravenous Contrast Given 12/26/17 2320)  morphine 4 MG/ML injection 4 mg (4 mg Intravenous Given 12/27/17 0019)  ciprofloxacin (CIPRO) tablet 500 mg (500 mg Oral Given 12/27/17 0032)  metroNIDAZOLE (FLAGYL) tablet 500 mg (500 mg Oral Given 12/27/17 0032)     Initial Impression / Assessment and Plan / ED Course  I have reviewed the triage vital signs and the nursing notes.  Pertinent labs & imaging results that were available during my care of the patient were reviewed by me and considered in my medical decision making (see chart  for details).     Pt presenting for evaluation of abdominal pain.  Physical exam shows patient who is initially mildly tachycardic and afebrile. HR improved without intervention.  TTP of right and left lower quadrant abdomen.  Concerning history including on chronic anticoagulation, diabetes, CVAs and MIs.  labs with mild leukocytosis of 11.6.  Otherwise reassuring.  Creatinine stable.  Hemoglobin stable.  Kidney, liver, pancreatic function reassuring.  UA without obvious infection. Consider viral GI illness, bacterial GI illness, appy, diverticulitis, GB etiology. Will order CT for further evaluation.  CT with enteritis. Also shows R upper lobe inflammation.infection. As pt is without cough, low suspicion for PNA.  Discussed findings with pt. Considering h/o DM, will tx enteritis with abx. discussed pain control with tylenol, norco as needed for breakthrough. PMP checked, pt without concerning narcotic use in the database. At this time, pt appears safe for d/c. return precautions given. Pt states he understands and agrees to plan.   Final Clinical Impressions(s) / ED Diagnoses   Final diagnoses:  Enteritis    ED Discharge Orders         Ordered    metroNIDAZOLE (FLAGYL) 500 MG tablet  2 times daily     12/27/17 0024    ciprofloxacin (CIPRO) 500 MG tablet  Every 12 hours     12/27/17 0024    ondansetron (ZOFRAN ODT) 4 MG disintegrating tablet  Every 8 hours PRN     12/27/17 0025    HYDROcodone-acetaminophen (NORCO/VICODIN) 5-325 MG tablet  Every 6 hours PRN     12/27/17 0025           Farrie Sann, PA-C 12/27/17 0042    Virgina Norfolk, DO 12/27/17 0159

## 2018-01-05 ENCOUNTER — Telehealth: Payer: Self-pay | Admitting: Pharmacist

## 2018-01-05 NOTE — Telephone Encounter (Signed)
Samples of XARELTO 20mg  were given to the patient, quantity 28 tabs, Lot Number [id number], EXP 06-21

## 2018-03-01 ENCOUNTER — Telehealth: Payer: Self-pay

## 2018-03-01 MED ORDER — WARFARIN SODIUM 5 MG PO TABS: 5.0000 mg | ORAL_TABLET | Freq: Every day | ORAL | 1 refills | Status: DC

## 2018-03-01 NOTE — Telephone Encounter (Signed)
Instructions to transition from Xarelto to warfarin  Feb/25 - Take Xarelto 20mg  and warfarin 5mg   Feb/26 - Take Xarelto 20mg  and warfarin 5mg   Feb/27 - warfarin 5mg   Feb/28 - warfarin 5mg   Feb/29 - warfarin 5mg   March/1 - warfarin 5mg   March/2 - warfarin 5mg   March/3 - warfarin 5mg   March/4 - appointment with coumadin clinic at 8am

## 2018-03-01 NOTE — Telephone Encounter (Signed)
Pt called stating that they would like to switch from xarelto to coumadin due to the cost. Will route to PharmD

## 2018-03-03 ENCOUNTER — Emergency Department (HOSPITAL_BASED_OUTPATIENT_CLINIC_OR_DEPARTMENT_OTHER): Payer: Self-pay

## 2018-03-03 ENCOUNTER — Encounter (HOSPITAL_BASED_OUTPATIENT_CLINIC_OR_DEPARTMENT_OTHER): Payer: Self-pay | Admitting: *Deleted

## 2018-03-03 ENCOUNTER — Other Ambulatory Visit: Payer: Self-pay

## 2018-03-03 ENCOUNTER — Emergency Department (HOSPITAL_BASED_OUTPATIENT_CLINIC_OR_DEPARTMENT_OTHER)
Admission: EM | Admit: 2018-03-03 | Discharge: 2018-03-03 | Disposition: A | Discharge status: Home / Self Care | Payer: Self-pay | Attending: Emergency Medicine | Admitting: Emergency Medicine

## 2018-03-03 DIAGNOSIS — Z79899 Other long term (current) drug therapy: Secondary | ICD-10-CM | POA: Insufficient documentation

## 2018-03-03 DIAGNOSIS — Z87891 Personal history of nicotine dependence: Secondary | ICD-10-CM | POA: Insufficient documentation

## 2018-03-03 DIAGNOSIS — Z8673 Personal history of transient ischemic attack (TIA), and cerebral infarction without residual deficits: Secondary | ICD-10-CM | POA: Insufficient documentation

## 2018-03-03 DIAGNOSIS — Y929 Unspecified place or not applicable: Secondary | ICD-10-CM | POA: Insufficient documentation

## 2018-03-03 DIAGNOSIS — I252 Old myocardial infarction: Secondary | ICD-10-CM | POA: Insufficient documentation

## 2018-03-03 DIAGNOSIS — E119 Type 2 diabetes mellitus without complications: Secondary | ICD-10-CM | POA: Insufficient documentation

## 2018-03-03 DIAGNOSIS — S8012XA Contusion of left lower leg, initial encounter: Secondary | ICD-10-CM | POA: Insufficient documentation

## 2018-03-03 DIAGNOSIS — I2510 Atherosclerotic heart disease of native coronary artery without angina pectoris: Secondary | ICD-10-CM | POA: Insufficient documentation

## 2018-03-03 DIAGNOSIS — Z7901 Long term (current) use of anticoagulants: Secondary | ICD-10-CM | POA: Insufficient documentation

## 2018-03-03 DIAGNOSIS — S80812A Abrasion, left lower leg, initial encounter: Secondary | ICD-10-CM | POA: Insufficient documentation

## 2018-03-03 DIAGNOSIS — W19XXXA Unspecified fall, initial encounter: Secondary | ICD-10-CM

## 2018-03-03 DIAGNOSIS — Z7902 Long term (current) use of antithrombotics/antiplatelets: Secondary | ICD-10-CM | POA: Insufficient documentation

## 2018-03-03 DIAGNOSIS — Y999 Unspecified external cause status: Secondary | ICD-10-CM | POA: Insufficient documentation

## 2018-03-03 DIAGNOSIS — Y9389 Activity, other specified: Secondary | ICD-10-CM | POA: Insufficient documentation

## 2018-03-03 DIAGNOSIS — W11XXXA Fall on and from ladder, initial encounter: Secondary | ICD-10-CM | POA: Insufficient documentation

## 2018-03-03 MED ORDER — KETOROLAC TROMETHAMINE 60 MG/2ML IM SOLN
60.0000 mg | Freq: Once | INTRAMUSCULAR | Status: AC
  Administered 2018-03-03: 60 mg via INTRAMUSCULAR
  Filled 2018-03-03: qty 2

## 2018-03-03 NOTE — Discharge Instructions (Addendum)
Keep your wound clean and return if there are any signs of infection including increased redness or pus draining from the wound. Take motrin or tylenol for pain. Thank you for allowing me to care for you today. Please return to the emergency department if you have new or worsening symptoms. Take your medications as instructed.

## 2018-03-03 NOTE — ED Triage Notes (Addendum)
He was standing near the top of a 13' ladder and the ladder fell. Injury to his left tib fib.

## 2018-03-03 NOTE — ED Provider Notes (Signed)
[facility] EMERGENCY DEPARTMENT Provider Note   CSN: [id number] Arrival date & time: 03/03/18  1302    History   Chief Complaint Chief Complaint  Patient presents with  . Fall    HPI [name] Holland is a 48 y.o. male.     Patient is a 48 year old gentleman with no significant past medical history who presents to the emergency department for left lower leg pain after a fall today.  He reports that he was on the top of a 13 foot extension ladder when the bottom of the ladder began to slide out from under it.  He reports that his left leg got caught in the rung of the ladder and he fell with the ladder to the ground.  He landed on his back with his left leg entangled in a rung.  He reports that he had a backpack on that braced his fall.  Reports that he was able to stand up and has been able to bear weight.  Reports that he did not hit his head or pass out.  Reports that he does not have any back pain or other injuries at all.  Reports he is up-to-date on his tetanus shot.  Reports that his pain is only in the left anterior lower shin     Past Medical History:  Diagnosis Date  . Acute deep vein thrombosis (DVT) of femoral vein (HCC)    dVT  . Arthritis    "knees" (11/23/2016)  . Clotting disorder (HCC)   . Cocaine abuse in remission (HCC) 2014  . Coronary artery disease due to lipid rich plaque   . DVT (deep venous thrombosis) (HCC) 05/2016   LLE  . 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 Hyperlipidemia   . Intracranial and intraspinal phlebitis and thrombophlebitis 05/31/2014  . Myocardial infarction (HCC) 2016   "caused by blood clot in the left side of my head"  . Stroke (HCC) 05/2014   denies residual on 11/23/2016  . Type II diabetes mellitus [facility](HCC)     Patient Active Problem List   Diagnosis Date Noted  . Unstable angina (HCC)   . Chest pain 07/17/2017  . Coronary artery disease due to lipid rich  plaque   . Non-ST elevation (NSTEMI) myocardial infarction (HCC)   . History of DVT (deep vein thrombosis) 11/23/2016  . 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 Long term current use of anticoagulant 06/17/2016  . Mixed hyperlipidemia 05/31/2016  . Bilateral primary osteoarthritis of knee 04/21/2015  . Cocaine abuse in remission (HCC) 04/21/2015  . Obesity (BMI 30-39.9)   . CVA (cerebral infarction) 05/31/2014  . 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 CARDIAC CATHETERIZATION  2008  . CORONARY STENT INTERVENTION N/A 11/24/2016   Procedure: CORONARY STENT INTERVENTION;  Surgeon: Kathleene HazelMcAlhany, Christopher D, MD;  Location: [facility] INVASIVE CV LAB;  Service: Cardiovascular;  Laterality: N/A;  . CORONARY THROMBECTOMY N/A 11/24/2016   Procedure: Coronary Thrombectomy;  Surgeon: Kathleene HazelMcAlhany, Christopher D, MD;  Location: [facility] INVASIVE CV LAB;  Service: Cardiovascular;  Laterality: N/A;  . HIP SURGERY Right   . KNEE ARTHROSCOPY Bilateral early 2000s  . LEFT HEART CATH AND CORONARY ANGIOGRAPHY N/A 11/24/2016   Procedure: LEFT HEART CATH AND CORONARY ANGIOGRAPHY;  Surgeon: Clifton JamesMcAlhany,  Nile Dear, MD;  Location: [facility] INVASIVE CV LAB;  Service: Cardiovascular;  Laterality: N/A;  . LEFT HEART CATH AND CORONARY ANGIOGRAPHY N/A 07/18/2017   Procedure: LEFT HEART CATH AND CORONARY ANGIOGRAPHY;  Surgeon: Yvonne Kendall, MD;  Location: [facility] INVASIVE CV LAB;  Service: Cardiovascular;  Laterality: N/A;  . WRIST SURGERY Right         Home Medications    Prior to Admission medications   Medication Sig Start Date End Date Taking? Authorizing Provider  clopidogrel (PLAVIX) 75 MG tablet Take 1 tablet (75 mg total) by mouth daily. 07/19/17  Yes Robbie Lis M, PA-C   isosorbide mononitrate (IMDUR) 30 MG 24 hr tablet Take 0.5 tablets (15 mg total) by mouth daily. 07/20/17  Yes Simmons, Brittainy M, PA-C  lidocaine (LIDODERM) 5 % Place 1 patch onto the skin daily. Remove & Discard patch within 12 hours or as directed by MD 09/02/17  Yes Maczis, Elmer Sow, PA-C  ondansetron (ZOFRAN ODT) 4 MG disintegrating tablet Take 1 tablet (4 mg total) by mouth every 8 (eight) hours as needed for nausea or vomiting. 12/27/17  Yes Caccavale, Sophia, PA-C  rivaroxaban (XARELTO) 20 MG TABS tablet Take 1 tablet (20 mg total) by mouth daily with supper. Patient taking differently: Take 20 mg by mouth every morning.  07/19/17  Yes Robbie Lis M, PA-C  warfarin (COUMADIN) 5 MG tablet Take 1 tablet (5 mg total) by mouth daily. 03/01/18  Yes Runell Gess, MD  atorvastatin (LIPITOR) 80 MG tablet Take 1 tablet (80 mg total) by mouth daily at 6 PM. 07/19/17   Allayne Butcher, PA-C  diclofenac sodium (VOLTAREN) 1 % GEL Apply 4 g topically 4 (four) times daily. 09/02/17   Maczis, Elmer Sow, PA-C  HYDROcodone-acetaminophen (NORCO/VICODIN) 5-325 MG tablet Take 1 tablet by mouth every 6 (six) hours as needed for severe pain. 12/27/17   Caccavale, Sophia, PA-C  methocarbamol (ROBAXIN) 500 MG tablet Take 1 tablet (500 mg total) by mouth 2 (two) times daily. 09/02/17   Maczis, Elmer Sow, PA-C  nitroGLYCERIN (NITROSTAT) 0.4 MG SL tablet Place 1 tablet (0.4 mg total) under the tongue every 5 (five) minutes as needed for chest pain. Patient not taking: Reported on 07/17/2017 05/11/17 08/09/17  Jodelle Gross, NP    Family History Family History  Problem Relation Age of Onset  . Other Mother   . Diabetes type II Mother   . Asthma Mother   . Diabetes Mother   . CAD Father        CABG  . Hypertension Father   . Hyperlipidemia Father   . Pancreatic cancer Maternal Aunt   . Lung cancer Maternal Aunt   . CAD Maternal Grandmother   . Cerebral aneurysm Paternal Grandmother   . Cancer  Paternal Grandfather        unknown    Social History Social History   Tobacco Use  . Smoking status: Former Smoker    Packs/day: 1.00    Years: 24.00    Pack years: 24.00    Types: Cigarettes    Last attempt to quit: 2014    Years since quitting: 6.1  . Smokeless tobacco: Current User    Types: Snuff  Substance Use Topics  . Alcohol use: No  . Drug use: No    Types: Cocaine     Allergies   Formaldehyde   Review of Systems Review of Systems  Constitutional: Negative for chills and fever.  HENT: Negative for ear pain and  sore throat.   Eyes: Negative for pain and visual disturbance.  Respiratory: Negative for cough and shortness of breath.   Cardiovascular: Negative for chest pain and palpitations.  Gastrointestinal: Negative for abdominal pain and vomiting.  Genitourinary: Negative for dysuria and hematuria.  Musculoskeletal: Positive for arthralgias. Negative for back pain, gait problem and joint swelling.  Skin: Positive for wound. Negative for color change and rash.  Neurological: Negative for dizziness, seizures, syncope and headaches.  All other systems reviewed and are negative.    Physical Exam Updated Vital Signs BP 126/81   Pulse 72   Temp 97.6 F (36.4 C) (Oral)   Resp 18   Ht 6\' 1"  (1.854 m)   Wt 131.5 kg   SpO2 95%   BMI 38.26 kg/m   Physical Exam Vitals signs and nursing note reviewed.  Constitutional:      Appearance: Normal appearance.  HENT:     Head: Normocephalic.  Eyes:     Conjunctiva/sclera: Conjunctivae normal.  Cardiovascular:     Rate and Rhythm: Normal rate and regular rhythm.  Pulmonary:     Effort: Pulmonary effort is normal.  Musculoskeletal:     Comments: Superficial abrasion about the size of 1 cm to the left distal anterior tibia.  He is tender to palpation around this area.  Full range of motion of the left knee, left hip, left ankle and foot without pain.  No other signs of injury or trauma.  Skin:    General:  Skin is dry.     Capillary Refill: Capillary refill takes less than 2 seconds.  Neurological:     General: No focal deficit present.     Mental Status: He is alert.  Psychiatric:        Mood and Affect: Mood normal.      ED Treatments / Results  Labs (all labs ordered are listed, but only abnormal results are displayed) Labs Reviewed - No data to display  EKG None  Radiology Dg Tibia/fibula Left  Result Date: 03/03/2018 CLINICAL DATA:  Left lower leg pain secondary to a fall from a ladder today. EXAM: LEFT TIBIA AND FIBULA - 2 VIEW COMPARISON:  Knee radiographs dated 10/09/2014 FINDINGS: There is no fracture or dislocation. No appreciable knee or ankle effusion. Old tiny avulsions adjacent to the medial and lateral malleoli at the left ankle. Posterior calcaneal enthesophyte at the Achilles insertion. Soft tissue calcification in the plantar aspect of the foot superficial to the plantar fascia. IMPRESSION: No acute abnormalities. Electronically Signed   By: Francene Boyers M.D.   On: 03/03/2018 13:36    Procedures Procedures (including critical care time)  Medications Ordered in ED Medications  ketorolac (TORADOL) injection 60 mg (has no administration in time range)     Initial Impression / Assessment and Plan / ED Course  I have reviewed the triage vital signs and the nursing notes.  Pertinent labs & imaging results that were available during my care of the patient were reviewed by me and considered in my medical decision making (see chart for details).        Based on review of vitals, medical screening exam, lab work and/or imaging, there does not appear to be an acute, emergent etiology for the patient's symptoms. Counseled pt on good return precautions and encouraged both PCP and ED follow-up as needed.  Prior to discharge, I also discussed incidental imaging findings with patient in detail and advised appropriate, recommended follow-up in detail.  Clinical  Impression: 1.  Accidental fall, initial encounter   2. Contusion of left lower leg, initial encounter   3. Abrasion of anterior left lower leg, initial encounter     Disposition: Discharge   This note was prepared with assistance of Dragon voice recognition software. Occasional wrong-word or sound-a-like substitutions may have occurred due to the inherent limitations of voice recognition software.   Final Clinical Impressions(s) / ED Diagnoses   Final diagnoses:  Accidental fall, initial encounter  Contusion of left lower leg, initial encounter  Abrasion of anterior left lower leg, initial encounter    ED Discharge Orders    None       Jeral Pinch 03/03/18 1440    Maia Plan, MD 03/04/18 1250

## 2018-03-20 ENCOUNTER — Ambulatory Visit (INDEPENDENT_AMBULATORY_CARE_PROVIDER_SITE_OTHER): Payer: Self-pay | Admitting: *Deleted

## 2018-03-20 DIAGNOSIS — Z5181 Encounter for therapeutic drug level monitoring: Secondary | ICD-10-CM

## 2018-03-20 DIAGNOSIS — Z86718 Personal history of other venous thrombosis and embolism: Secondary | ICD-10-CM

## 2018-03-20 LAB — POCT INR: INR: 5.2 — AB (ref 2.0–3.0)

## 2018-03-20 NOTE — Patient Instructions (Signed)
Description   Skip tomorrow and Wednesday's dose, then start taking 1/2 tablet daily except 1 tablet on Mondays, Wednesdays and Fridays. Recheck in one week. Call us with any questions or medication changes # [phone] [facility].

## 2018-05-22 IMAGING — CT CT HEAD WO/W CM
1 of 2 series · 13 of 30 positions shown, 17 images · IV contrast (iopamidol)
Comparison: 06/01/2016 MRI, MRA, MRV of the head and CT venogram.
10/01/2007 CT of the cervical spine.

CLINICAL DATA: 46 y/o M; headaches during and after exercise for 3
days.

EXAM:
CT HEAD WITHOUT AND WITH CONTRAST
TECHNIQUE: Contiguous axial images were obtained from the base of the skull
through the vertex without and with intravenous contrast
CONTRAST:  75mL [id number] IOPAMIDOL (WO73BO-GLL) INJECTION 61%

[Series 2: head w/(date) · axial · 0.47mm/px · z∈[-165,-40]mm · 13 of 31 slices shown, 17 images]
[im 3/31  brain]
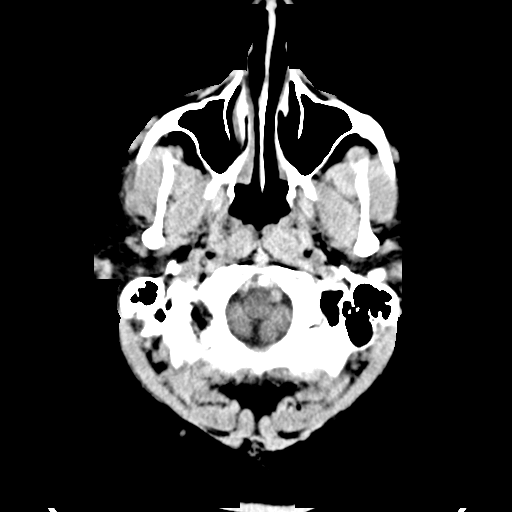
[im 3/31  bone]
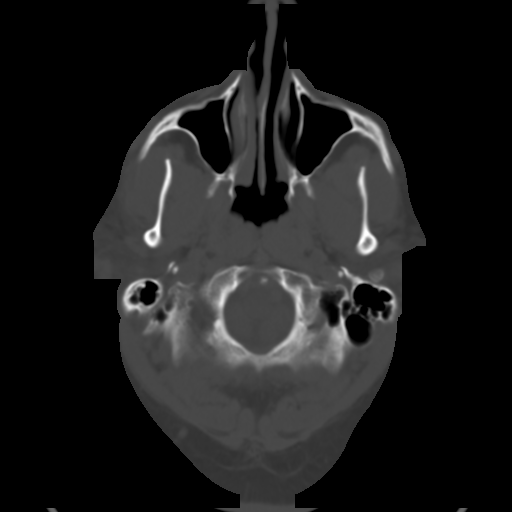
[im 5/31  brain]
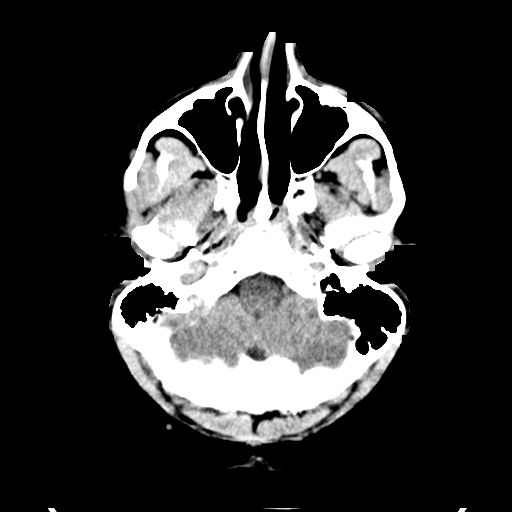
[im 7/31  brain]
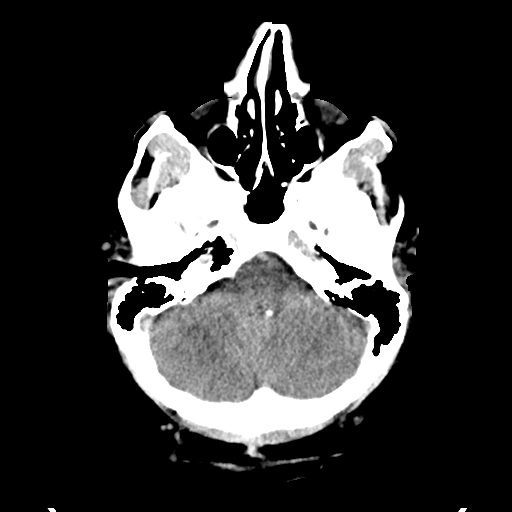
[im 9/31  brain]
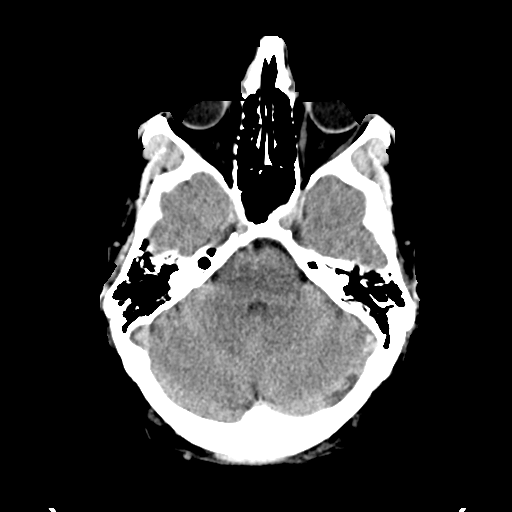
[im 11/31  brain]
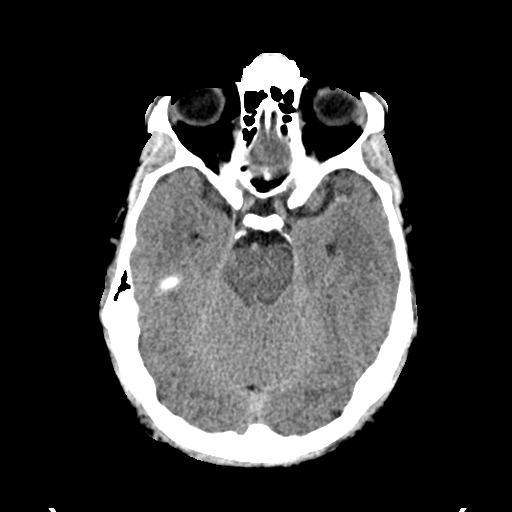
[im 11/31  bone]
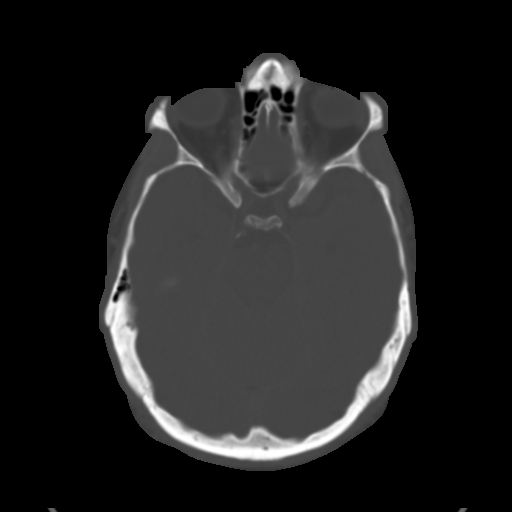
[im 13/31  brain]
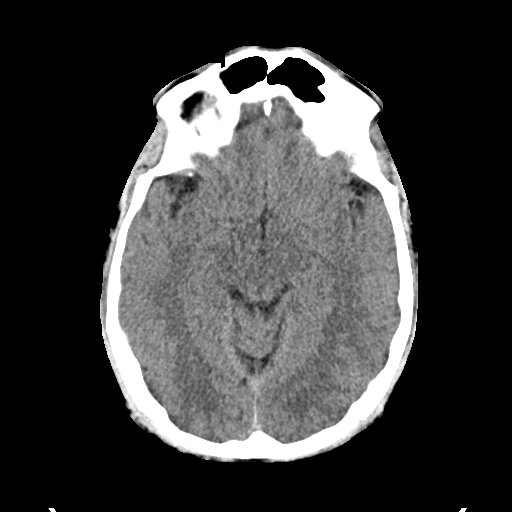
[im 16/31  brain]
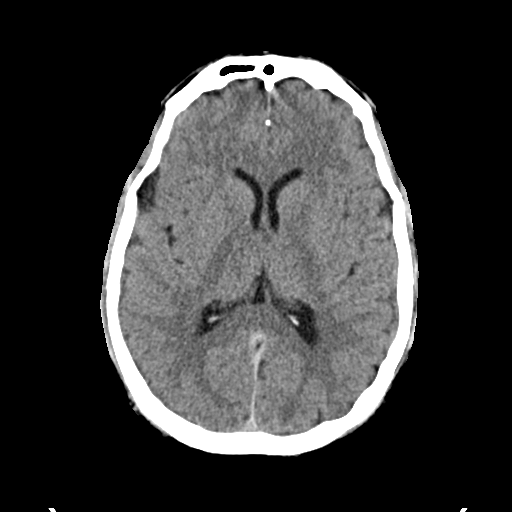
[im 18/31  brain]
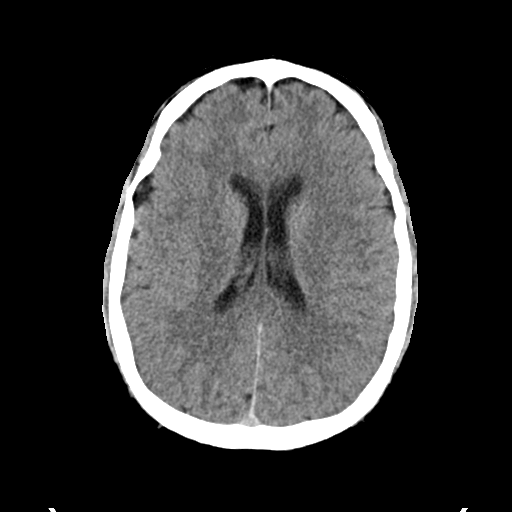
[im 20/31  brain]
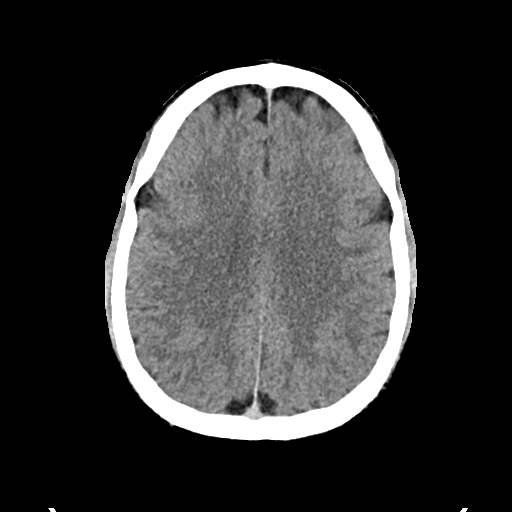
[im 20/31  bone]
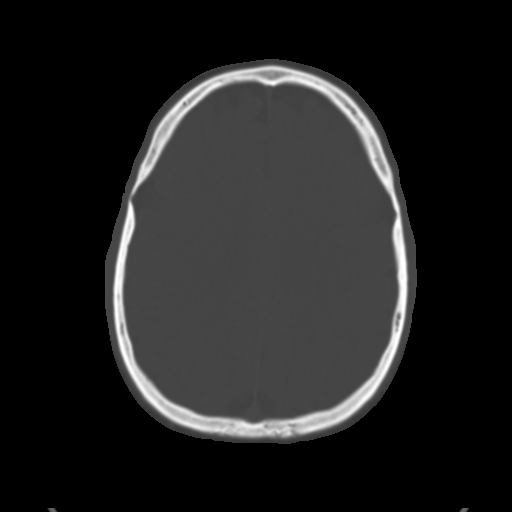
[im 22/31  brain]
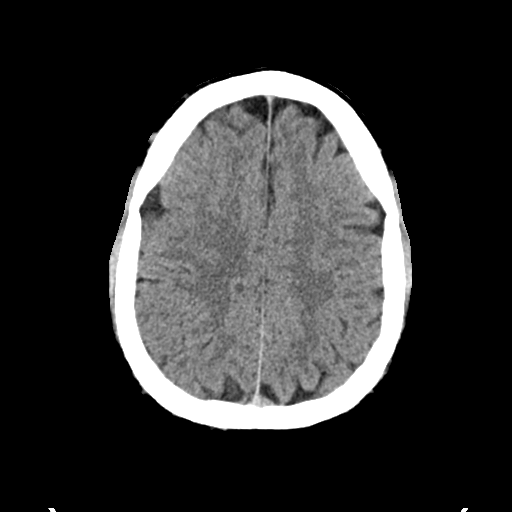
[im 24/31  brain]
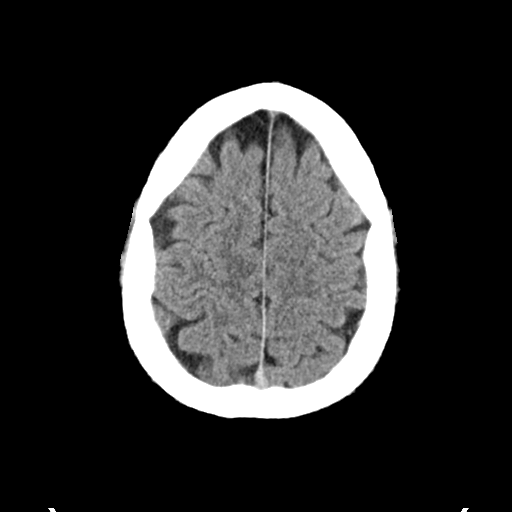
[im 26/31  brain]
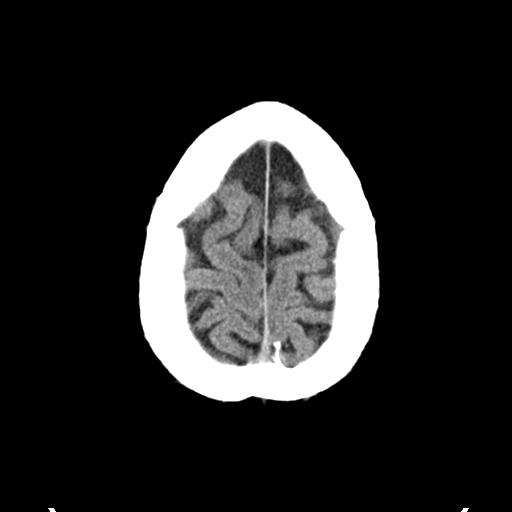
[im 28/31  brain]
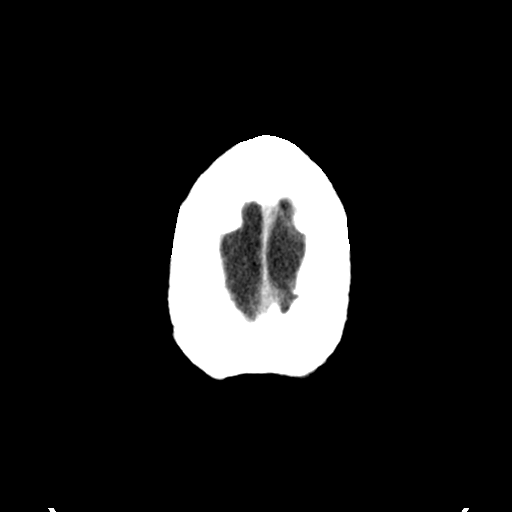
[im 28/31  bone]
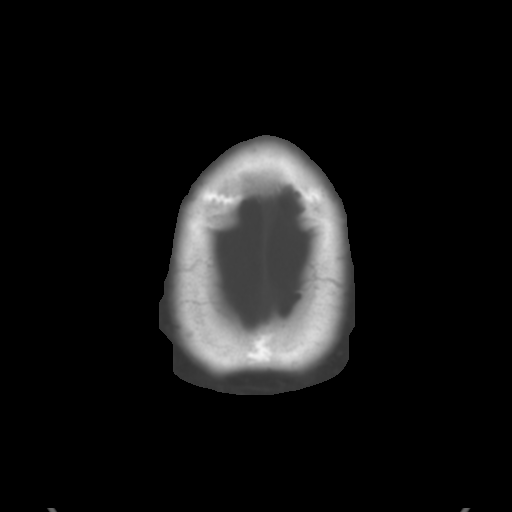

[13 of 30 positions shown; findings below may reference images not displayed]

FINDINGS: Brain: No evidence of acute infarction, hemorrhage, hydrocephalus,
extra-axial collection or mass lesion/mass effect.

Vascular: Non expansile low-attenuation filling defect within the
left transverse sinus is stable from 8776 (series 4, image 9) with
possible vascular pedicle (series 4, image 9). Otherwise negative.

Skull: Normal. Negative for fracture or focal lesion.

Sinuses/Orbits: No acute finding.

Other: None.
IMPRESSION: 1. No acute intracranial abnormality.  Unremarkable CT of the brain.
2. Stable low-attenuation filling defect within left transverse
sinus from 8776. This may represent a chronic thrombus as described
on prior studies, but given long-term stability and suggestion of
vascular pedicle a large arachnoid granulation is favored.
3. No additional vascular abnormality identified.

By: Larbii Benichou M.D.

## 2018-05-25 ENCOUNTER — Telehealth: Payer: Self-pay | Admitting: Pharmacist Clinician (PhC)/ Clinical Pharmacy Specialist

## 2018-05-25 ENCOUNTER — Other Ambulatory Visit: Payer: Self-pay | Admitting: Cardiovascular Disease

## 2018-05-25 NOTE — Telephone Encounter (Signed)

## 2018-06-15 ENCOUNTER — Telehealth: Payer: Self-pay

## 2018-06-15 NOTE — Telephone Encounter (Signed)

## 2018-07-26 ENCOUNTER — Telehealth: Payer: Self-pay

## 2018-07-26 NOTE — Telephone Encounter (Signed)
LMOM; okay to switch back to DOAC (Xarelto, Eliquis or Predaxa)  Will discharge from coumadin service if no show to INR check in next 2 weeks (last INR >5, more than 4 months overdue)  3 no show appts to coumadin clinic since March/2020

## 2018-07-26 NOTE — Telephone Encounter (Signed)
lmomed pt for overdue appt

## 2018-08-15 ENCOUNTER — Other Ambulatory Visit: Payer: Self-pay

## 2018-08-15 ENCOUNTER — Emergency Department (HOSPITAL_BASED_OUTPATIENT_CLINIC_OR_DEPARTMENT_OTHER): Payer: Self-pay

## 2018-08-15 ENCOUNTER — Encounter (HOSPITAL_BASED_OUTPATIENT_CLINIC_OR_DEPARTMENT_OTHER): Payer: Self-pay | Admitting: Radiology

## 2018-08-15 ENCOUNTER — Emergency Department (HOSPITAL_BASED_OUTPATIENT_CLINIC_OR_DEPARTMENT_OTHER)
Admission: EM | Admit: 2018-08-15 | Discharge: 2018-08-15 | Disposition: A | Discharge status: Home / Self Care | Payer: Self-pay | Attending: Emergency Medicine | Admitting: Emergency Medicine

## 2018-08-15 DIAGNOSIS — Z20828 Contact with and (suspected) exposure to other viral communicable diseases: Secondary | ICD-10-CM | POA: Insufficient documentation

## 2018-08-15 DIAGNOSIS — E119 Type 2 diabetes mellitus without complications: Secondary | ICD-10-CM | POA: Insufficient documentation

## 2018-08-15 DIAGNOSIS — Z8673 Personal history of transient ischemic attack (TIA), and cerebral infarction without residual deficits: Secondary | ICD-10-CM | POA: Insufficient documentation

## 2018-08-15 DIAGNOSIS — I252 Old myocardial infarction: Secondary | ICD-10-CM | POA: Insufficient documentation

## 2018-08-15 DIAGNOSIS — Z7901 Long term (current) use of anticoagulants: Secondary | ICD-10-CM | POA: Insufficient documentation

## 2018-08-15 DIAGNOSIS — F17220 Nicotine dependence, chewing tobacco, uncomplicated: Secondary | ICD-10-CM | POA: Insufficient documentation

## 2018-08-15 DIAGNOSIS — Z7902 Long term (current) use of antithrombotics/antiplatelets: Secondary | ICD-10-CM | POA: Insufficient documentation

## 2018-08-15 DIAGNOSIS — Z79899 Other long term (current) drug therapy: Secondary | ICD-10-CM | POA: Insufficient documentation

## 2018-08-15 DIAGNOSIS — R0789 Other chest pain: Secondary | ICD-10-CM | POA: Insufficient documentation

## 2018-08-15 DIAGNOSIS — I2510 Atherosclerotic heart disease of native coronary artery without angina pectoris: Secondary | ICD-10-CM | POA: Insufficient documentation

## 2018-08-15 LAB — CBC WITH DIFFERENTIAL/PLATELET
Abs Immature Granulocytes: 0.02 10*3/uL (ref 0.00–0.07)
Basophils Absolute: 0 10*3/uL (ref 0.0–0.1)
Basophils Relative: 1 %
Eosinophils Absolute: 0.3 10*3/uL (ref 0.0–0.5)
Eosinophils Relative: 4 %
HCT: 45.2 % (ref 39.0–52.0)
Hemoglobin: 14.7 g/dL (ref 13.0–17.0)
Immature Granulocytes: 0 %
Lymphocytes Relative: 28 %
Lymphs Abs: 2 10*3/uL (ref 0.7–4.0)
MCH: 29.1 pg (ref 26.0–34.0)
MCHC: 32.5 g/dL (ref 30.0–36.0)
MCV: 89.5 fL (ref 80.0–100.0)
Monocytes Absolute: 0.5 10*3/uL (ref 0.1–1.0)
Monocytes Relative: 7 %
Neutro Abs: 4.4 10*3/uL (ref 1.7–7.7)
Neutrophils Relative %: 60 %
Platelets: 155 10*3/uL (ref 150–400)
RBC: 5.05 MIL/uL (ref 4.22–5.81)
RDW: 13.2 % (ref 11.5–15.5)
WBC: 7.2 10*3/uL (ref 4.0–10.5)
nRBC: 0 % (ref 0.0–0.2)

## 2018-08-15 LAB — BASIC METABOLIC PANEL
Anion gap: 8 (ref 5–15)
BUN: 13 mg/dL (ref 6–20)
CO2: 23 mmol/L (ref 22–32)
Calcium: 9.1 mg/dL (ref 8.9–10.3)
Chloride: 107 mmol/L (ref 98–111)
Creatinine, Ser: 0.83 mg/dL (ref 0.61–1.24)
GFR calc Af Amer: >60 mL/min (ref >60)
GFR calc non Af Amer: >60 mL/min (ref >60)
Glucose, Bld: 98 mg/dL (ref 70–99)
Potassium: 4 mmol/L (ref 3.5–5.1)
Sodium: 138 mmol/L (ref 135–145)

## 2018-08-15 LAB — PROTIME-INR
INR: 1 (ref 0.8–1.2)
Prothrombin Time: 13.2 seconds (ref 11.4–15.2)

## 2018-08-15 LAB — TROPONIN I (HIGH SENSITIVITY)
Troponin I (High Sensitivity): 2 ng/L (ref <18)
Troponin I (High Sensitivity): 3 ng/L (ref <18)

## 2018-08-15 MED ORDER — ALUM & MAG HYDROXIDE-SIMETH 200-200-20 MG/5ML PO SUSP
30.0000 mL | Freq: Once | ORAL | Status: AC
  Administered 2018-08-15: 30 mL via ORAL
  Filled 2018-08-15: qty 30

## 2018-08-15 MED ORDER — NITROGLYCERIN 0.4 MG SL SUBL
0.4000 mg | SUBLINGUAL_TABLET | SUBLINGUAL | Status: DC | PRN
  Administered 2018-08-15: 0.4 mg via SUBLINGUAL
  Filled 2018-08-15: qty 1

## 2018-08-15 MED ORDER — IOHEXOL 350 MG/ML SOLN
100.0000 mL | Freq: Once | INTRAVENOUS | Status: AC | PRN
  Administered 2018-08-15: 100 mL via INTRAVENOUS

## 2018-08-15 MED ORDER — ASPIRIN 81 MG PO CHEW
324.0000 mg | CHEWABLE_TABLET | Freq: Once | ORAL | Status: AC
  Administered 2018-08-15: 324 mg via ORAL
  Filled 2018-08-15: qty 4

## 2018-08-15 NOTE — Discharge Instructions (Signed)
Please follow-up with your cardiologist as an outpatient.  Please return to the emergency department for worsening chest pain or trouble breathing.     Person Under Monitoring Name: [name] Holland  Location: [place] [address]   Infection Prevention Recommendations for Individuals Confirmed to have, or Being Evaluated for, 2019 Novel Coronavirus (COVID-19) Infection Who Receive Care at Home  Individuals who are confirmed to have, or are being evaluated for, COVID-19 should follow the prevention steps below until a healthcare provider or local or state health department says they can return to normal activities.  Stay home except to get medical care You should restrict activities outside your home, except for getting medical care. Do not go to work, school, or public areas, and do not use public transportation or taxis.  Call ahead before visiting your doctor Before your medical appointment, call the healthcare provider and tell them that you have, or are being evaluated for, COVID-19 infection. This will help the healthcare providers office take steps to keep other people from getting infected. Ask your healthcare provider to call the local or state health department.  Monitor your symptoms Seek prompt medical attention if your illness is worsening (e.g., difficulty breathing). Before going to your medical appointment, call the healthcare provider and tell them that you have, or are being evaluated for, COVID-19 infection. Ask your healthcare provider to call the local or state health department.  Wear a facemask You should wear a facemask that covers your nose and mouth when you are in the same room with other people and when you visit a healthcare provider. People who live with or visit you should also wear a facemask while they are in the same room with you.  Separate yourself from other people in your home As much as possible, you should stay in a  different room from other people in your home. Also, you should use a separate bathroom, if available.  Avoid sharing household items You should not share dishes, drinking glasses, cups, eating utensils, towels, bedding, or other items with other people in your home. After using these items, you should wash them thoroughly with soap and water.  Cover your coughs and sneezes Cover your mouth and nose with a tissue when you cough or sneeze, or you can cough or sneeze into your sleeve. Throw used tissues in a lined trash can, and immediately wash your hands with soap and water for at least 20 seconds or use an alcohol-based hand rub.  Wash your [facility] your hands often and thoroughly with soap and water for at least 20 seconds. You can use an alcohol-based hand sanitizer if soap and water are not available and if your hands are not visibly dirty. Avoid touching your eyes, nose, and mouth with unwashed hands.   Prevention Steps for Caregivers and Household Members of Individuals Confirmed to have, or Being Evaluated for, COVID-19 Infection Being Cared for in the Home  If you live with, or provide care at home for, a person confirmed to have, or being evaluated for, COVID-19 infection please follow these guidelines to prevent infection:  Follow healthcare providers instructions Make sure that you understand and can help the patient follow any healthcare provider instructions for all care.  Provide for the patients basic needs You should help the patient with basic needs in the home and provide support for getting groceries, prescriptions, and other personal needs.  Monitor the patients symptoms If they are getting sicker, call  his or her medical provider and tell them that the patient has, or is being evaluated for, COVID-19 infection. This will help the healthcare providers office take steps to keep other people from getting infected. Ask the healthcare provider to call the local  or state health department.  Limit the number of people who have contact with the patient If possible, have only one caregiver for the patient. Other household members should stay in another home or place of residence. If this is not possible, they should stay in another room, or be separated from the patient as much as possible. Use a separate bathroom, if available. Restrict visitors who do not have an essential need to be in the home.  Keep older adults, very young children, and other sick people away from the patient Keep older adults, very young children, and those who have compromised immune systems or chronic health conditions away from the patient. This includes people with chronic heart, lung, or kidney conditions, diabetes, and cancer.  Ensure good ventilation Make sure that shared spaces in the home have good air flow, such as from an air conditioner or an opened window, weather permitting.  Wash your hands often Wash your hands often and thoroughly with soap and water for at least 20 seconds. You can use an alcohol based hand sanitizer if soap and water are not available and if your hands are not visibly dirty. Avoid touching your eyes, nose, and mouth with unwashed hands. Use disposable paper towels to dry your hands. If not available, use dedicated cloth towels and replace them when they become wet.  Wear a facemask and gloves Wear a disposable facemask at all times in the room and gloves when you touch or have contact with the patients blood, body fluids, and/or secretions or excretions, such as sweat, saliva, sputum, nasal mucus, vomit, urine, or feces.  Ensure the mask fits over your nose and mouth tightly, and do not touch it during use. Throw out disposable facemasks and gloves after using them. Do not reuse. Wash your hands immediately after removing your facemask and gloves. If your personal clothing becomes contaminated, carefully remove clothing and launder. Wash your  hands after handling contaminated clothing. Place all used disposable facemasks, gloves, and other waste in a lined container before disposing them with other household waste. Remove gloves and wash your hands immediately after handling these items.  Do not share dishes, glasses, or other household items with the patient Avoid sharing household items. You should not share dishes, drinking glasses, cups, eating utensils, towels, bedding, or other items with a patient who is confirmed to have, or being evaluated for, COVID-19 infection. After the person uses these items, you should wash them thoroughly with soap and water.  Wash laundry thoroughly Immediately remove and wash clothes or bedding that have blood, body fluids, and/or secretions or excretions, such as sweat, saliva, sputum, nasal mucus, vomit, urine, or feces, on them. Wear gloves when handling laundry from the patient. Read and follow directions on labels of laundry or clothing items and detergent. In general, wash and dry with the warmest temperatures recommended on the label.  Clean all areas the individual has used often Clean all touchable surfaces, such as counters, tabletops, doorknobs, bathroom fixtures, toilets, phones, keyboards, tablets, and bedside tables, every day. Also, clean any surfaces that may have blood, body fluids, and/or secretions or excretions on them. Wear gloves when cleaning surfaces the patient has come in contact with. Use a diluted bleach solution (e.g.,  dilute bleach with 1 part bleach and 10 parts water) or a household disinfectant with a label that says EPA-registered for coronaviruses. To make a bleach solution at home, add 1 tablespoon of bleach to 1 quart (4 cups) of water. For a larger supply, add  cup of bleach to 1 gallon (16 cups) of water. Read labels of cleaning products and follow recommendations provided on product labels. Labels contain instructions for safe and effective use of the cleaning  product including precautions you should take when applying the product, such as wearing gloves or eye protection and making sure you have good ventilation during use of the product. Remove gloves and wash hands immediately after cleaning.  Monitor yourself for signs and symptoms of illness Caregivers and household members are considered close contacts, should monitor their health, and will be asked to limit movement outside of the home to the extent possible. Follow the monitoring steps for close contacts listed on the symptom monitoring form.   ? If you have additional questions, contact your local health department or call the epidemiologist on call at [phone] (available 24/7). ? This guidance is subject to change. For the most up-to-date guidance from [facility], please refer to their website: [email or web address]

## 2018-08-15 NOTE — ED Notes (Signed)
Patient transported to X-ray 

## 2018-08-15 NOTE — ED Notes (Signed)
Pt verbalizes pain 2/10 after 1 dose of nitro. Pt states headache 6/10 after nitro.

## 2018-08-15 NOTE — ED Triage Notes (Signed)
Pt c/o chest pain x 3 days. Pt verbalizes headache, cough, with mild dizziness and nausea. Pt denies fever, denies sore throat. Reports feeling "clammy" on Sunday, denies at this time. Pt reports hx of cardiac and stroke.

## 2018-08-15 NOTE — ED Notes (Signed)
Pt refused 2nd dose of nitro

## 2018-08-15 NOTE — ED Notes (Signed)
ED Provider at bedside. 

## 2018-08-15 NOTE — ED Provider Notes (Signed)
[facility] EMERGENCY DEPARTMENT Provider Note   CSN: [id number] Arrival date & time: 08/15/18  1037    History   Chief Complaint Chief Complaint  Patient presents with   Chest Pain    HPI [name] Holland is a 48 y.o. male.     48 yo M with a chief complaint of chest pain.  Going on for the past couple days.  Feels like his prior MI but is less in intensity.  Has some pain that goes up into his neck and then down the left arm.  Does not seem to be exertional.  Mild shortness of breath with this.  No diaphoresis nausea or vomiting.  Has had a mild cough starting few hours ago.  No fevers.  No close contacts with illness.  Pain is pinpoint to the right side of his chest.  Feeling generally fatigued.  The history is provided by the patient.  Chest Pain Pain location:  R chest Pain quality: aching   Pain radiates to:  Does not radiate Pain severity:  Mild Onset quality:  Gradual Duration:  2 days Timing:  Constant Progression:  Worsening Chronicity:  New Associated symptoms: cough and shortness of breath   Associated symptoms: no abdominal pain, no fever, no headache, no palpitations and no vomiting     Past Medical History:  Diagnosis Date   Acute deep vein thrombosis (DVT) of femoral vein (HCC)    dVT   Arthritis    "knees" (11/23/2016)   Clotting disorder (HCC)    Cocaine abuse in remission (HCC) 2014   Coronary artery disease due to lipid rich plaque    DVT (deep venous thrombosis) (HCC) 05/2016   LLE   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   Hyperlipidemia    Intracranial and intraspinal phlebitis and thrombophlebitis 05/31/2014   Myocardial infarction (HCC) 2016   "caused by blood clot in the left side of my head"   Stroke (HCC) 05/2014   denies residual on 11/23/2016   Type II diabetes mellitus [facility](HCC)     Patient Active Problem List   Diagnosis Date Noted   Unstable angina  (HCC)    Chest pain 07/17/2017   Coronary artery disease due to lipid rich plaque    Non-ST elevation (NSTEMI) myocardial infarction [facility](HCC)    History of DVT (deep vein thrombosis) 11/23/2016   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   Long term current use of anticoagulant 06/17/2016   Mixed hyperlipidemia 05/31/2016   Bilateral primary osteoarthritis of knee 04/21/2015   Cocaine abuse in remission (HCC) 04/21/2015   Obesity (BMI 30-39.9)    CVA (cerebral infarction) 05/31/2014   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, without long-term current use of insulin (HCC) 05/31/2014   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    Past Surgical History:  Procedure Laterality Date   CARDIAC CATHETERIZATION  2008   CORONARY STENT INTERVENTION N/A 11/24/2016   Procedure: CORONARY STENT INTERVENTION;  Surgeon: Kathleene HazelMcAlhany, Christopher D, MD;  Location: [facility] INVASIVE CV LAB;  Service: Cardiovascular;  Laterality: N/A;   CORONARY THROMBECTOMY N/A 11/24/2016   Procedure: Coronary Thrombectomy;  Surgeon: Kathleene HazelMcAlhany, Christopher D, MD;  Location: [facility] INVASIVE CV LAB;  Service: Cardiovascular;  Laterality: N/A;   HIP SURGERY Right    KNEE ARTHROSCOPY Bilateral early 2000s   LEFT HEART CATH AND CORONARY ANGIOGRAPHY N/A 11/24/2016   Procedure: LEFT HEART CATH AND CORONARY ANGIOGRAPHY;  Surgeon: Kathleene HazelMcAlhany, Christopher D, MD;  [facility];  Service: Cardiovascular;  Laterality: N/A;   LEFT HEART CATH AND CORONARY ANGIOGRAPHY N/A 07/18/2017   Procedure: LEFT HEART CATH AND CORONARY ANGIOGRAPHY;  Surgeon: Yvonne KendallEnd, Christopher, MD;  Location: [facility] INVASIVE CV LAB;  Service: Cardiovascular;  Laterality: N/A;   WRIST SURGERY Right         Home Medications    Prior to Admission medications   Medication Sig Start Date End Date Taking? Authorizing Provider  atorvastatin (LIPITOR) 80 MG tablet  Take 1 tablet (80 mg total) by mouth daily at 6 PM. 07/19/17   Allayne ButcherSimmons, Brittainy M, PA-C  clopidogrel (PLAVIX) 75 MG tablet Take 1 tablet (75 mg total) by mouth daily. 07/19/17   Robbie LisSimmons, Brittainy M, PA-C  diclofenac sodium (VOLTAREN) 1 % GEL Apply 4 g topically 4 (four) times daily. 09/02/17   Maczis, Elmer SowMichael M, PA-C  HYDROcodone-acetaminophen (NORCO/VICODIN) 5-325 MG tablet Take 1 tablet by mouth every 6 (six) hours as needed for severe pain. 12/27/17   Caccavale, Sophia, PA-C  isosorbide mononitrate (IMDUR) 30 MG 24 hr tablet Take 0.5 tablets (15 mg total) by mouth daily. 07/20/17   Robbie LisSimmons, Brittainy M, PA-C  lidocaine (LIDODERM) 5 % Place 1 patch onto the skin daily. Remove & Discard patch within 12 hours or as directed by MD 09/02/17   Maczis, Elmer SowMichael M, PA-C  methocarbamol (ROBAXIN) 500 MG tablet Take 1 tablet (500 mg total) by mouth 2 (two) times daily. 09/02/17   Maczis, Elmer SowMichael M, PA-C  nitroGLYCERIN (NITROSTAT) 0.4 MG SL tablet Place 1 tablet (0.4 mg total) under the tongue every 5 (five) minutes as needed for chest pain. Patient not taking: Reported on 07/17/2017 05/11/17 08/09/17  Jodelle GrossLawrence, Kathryn M, NP  ondansetron (ZOFRAN ODT) 4 MG disintegrating tablet Take 1 tablet (4 mg total) by mouth every 8 (eight) hours as needed for nausea or vomiting. 12/27/17   Caccavale, Sophia, PA-C  rivaroxaban (XARELTO) 20 MG TABS tablet Take 1 tablet (20 mg total) by mouth daily with supper. Patient taking differently: Take 20 mg by mouth every morning.  07/19/17   Robbie LisSimmons, Brittainy M, PA-C  warfarin (COUMADIN) 5 MG tablet Take 1 tablet (5 mg total) by mouth daily. [place] for INR follow up. Call clinic prior to next refill authorization. 05/25/18   End, Cristal Deerhristopher, MD    Family History Family History  Problem Relation Age of Onset   Other Mother    Diabetes type II Mother    Asthma Mother    Diabetes Mother    CAD Father        CABG   Hypertension Father    Hyperlipidemia Father    Pancreatic  cancer Maternal Aunt    Lung cancer Maternal Aunt    CAD Maternal Grandmother    Cerebral aneurysm Paternal Grandmother    Cancer Paternal Grandfather        unknown    Social History Social History   Tobacco Use   Smoking status: Former Smoker    Packs/day: 1.00    Years: 24.00    Pack years: 24.00    Types: Cigarettes    Quit date: 2014    Years since quitting: 6.5   Smokeless tobacco: Current User    Types: Snuff  Substance Use Topics   Alcohol use: No   Drug use: No    Types: Cocaine     Allergies   Formaldehyde   Review of Systems Review of Systems  Constitutional: Negative  for chills and fever.  HENT: Negative for congestion and facial swelling.   Eyes: Negative for discharge and visual disturbance.  Respiratory: Positive for cough and shortness of breath.   Cardiovascular: Positive for chest pain. Negative for palpitations.  Gastrointestinal: Negative for abdominal pain, diarrhea and vomiting.  Musculoskeletal: Negative for arthralgias and myalgias.  Skin: Negative for color change and rash.  Neurological: Negative for tremors, syncope and headaches.  Psychiatric/Behavioral: Negative for confusion and dysphoric mood.     Physical Exam Updated Vital Signs BP 119/63    Pulse 73    Temp 98.4 F (36.9 C) (Oral)    Resp 19    Ht 6\' 1"  (1.854 m)    Wt (!) 140.6 kg    SpO2 97%    BMI 40.90 kg/m   Physical Exam Vitals signs and nursing note reviewed.  Constitutional:      Appearance: He is well-developed.  HENT:     Head: Normocephalic and atraumatic.  Eyes:     Pupils: Pupils are equal, round, and reactive to light.  Neck:     Musculoskeletal: Normal range of motion and neck supple.     Vascular: No JVD.  Cardiovascular:     Rate and Rhythm: Normal rate and regular rhythm.     Heart sounds: No murmur. No friction rub. No gallop.   Pulmonary:     Effort: No respiratory distress.     Breath sounds: No wheezing.  Chest:     Chest wall:  Tenderness present.     Comments: Mild tenderness on the right chest wall where the patient is having symptoms. Abdominal:     General: There is no distension.     Tenderness: There is no guarding or rebound.  Musculoskeletal: Normal range of motion.  Skin:    Coloration: Skin is not pale.     Findings: No rash.  Neurological:     Mental Status: He is alert and oriented to person, place, and time.  Psychiatric:        Behavior: Behavior normal.      ED Treatments / Results  Labs (all labs ordered are listed, but only abnormal results are displayed) Labs Reviewed  NOVEL CORONAVIRUS, NAA (HOSPITAL ORDER, SEND-OUT TO REF LAB)  CBC WITH DIFFERENTIAL/PLATELET  BASIC METABOLIC PANEL  PROTIME-INR  TROPONIN I (HIGH SENSITIVITY)  TROPONIN I (HIGH SENSITIVITY)    EKG EKG Interpretation  Date/Time:  Tuesday August 15 2018 10:48:06 EDT Ventricular Rate:  77 PR Interval:    QRS Duration: 101 QT Interval:  374 QTC Calculation: 424 R Axis:   88 Text Interpretation:  Sinus rhythm Inferior infarct, old Abnormal lateral Q waves No significant change since last tracing Confirmed by Melene Plan [phone]) on 08/15/2018 10:53:36 AM   Radiology Dg Chest 2 View  Result Date: 08/15/2018 CLINICAL DATA:  Chest pain, shortness of breath EXAM: CHEST - 2 VIEW COMPARISON:  07/17/2017 FINDINGS: The heart size and mediastinal contours are within normal limits. Increased perihilar interstitial markings bilaterally. No focal airspace consolidation, pleural effusion, or pneumothorax. The visualized skeletal structures are unremarkable. IMPRESSION: Mildly prominent perihilar interstitial markings bilaterally, which may represent atypical or viral infection versus early edema. Electronically Signed   By: Duanne Guess M.D.   On: 08/15/2018 11:34   Ct Angio Chest Pe W And/or Wo Contrast  Result Date: 08/15/2018 CLINICAL DATA:  Chest pain and shortness of breath. EXAM: CT ANGIOGRAPHY CHEST WITH CONTRAST  TECHNIQUE: Multidetector CT imaging of the chest was performed using  the standard protocol during bolus administration of intravenous contrast. Multiplanar CT image reconstructions and MIPs were obtained to evaluate the vascular anatomy. CONTRAST:  100mL OMNIPAQUE IOHEXOL 350 MG/ML SOLN COMPARISON:  CT angiogram chest July 17, 2017; chest radiograph August 15, 2018 FINDINGS: Cardiovascular: There is no demonstrable pulmonary embolus. There is no thoracic aortic aneurysm or dissection. The visualized great vessels appear normal. There is calcification in the left anterior descending coronary artery. There is no pericardial effusion or pericardial thickening. Mediastinum/Nodes: Thyroid appears unremarkable. There is no appreciable thoracic adenopathy. There is a small hiatal hernia. Lungs/Pleura: There is no appreciable edema or consolidation. No pleural effusions evident. Upper Abdomen: There is slight reflux into the inferior vena cava and hepatic veins. Visualized upper abdominal structures otherwise appear unremarkable. Musculoskeletal: There are no evident blastic or lytic bone lesions. There is degenerative change in the lower thoracic region. No chest wall lesions are evident. Review of the MIP images confirms the above findings. IMPRESSION: 1. No demonstrable pulmonary embolus. No thoracic aortic aneurysm or dissection. There is left anterior descending coronary artery calcification. 2.  Lungs clear. 3.  No evident thoracic adenopathy. 4.  Small hiatal hernia. 5. Slight reflux of contrast into the inferior vena cava and hepatic veins. This finding may indicate a degree of increased right heart pressure. Electronically Signed   By: Bretta BangWilliam  Woodruff III M.D.   On: 08/15/2018 13:25    Procedures Procedures (including critical care time)  Medications Ordered in ED Medications  nitroGLYCERIN (NITROSTAT) SL tablet 0.4 mg (0.4 mg Sublingual Given 08/15/18 1128)  aspirin chewable tablet 324 mg (324 mg Oral Given  08/15/18 1127)  alum & mag hydroxide-simeth (MAALOX/MYLANTA) 200-200-20 MG/5ML suspension 30 mL (30 mLs Oral Given 08/15/18 1236)  iohexol (OMNIPAQUE) 350 MG/ML injection 100 mL (100 mLs Intravenous Contrast Given 08/15/18 1259)     Initial Impression / Assessment and Plan / ED Course  I have reviewed the triage vital signs and the nursing notes.  Pertinent labs & imaging results that were available during my care of the patient were reviewed by me and considered in my medical decision making (see chart for details).        48 yo M with a chief complaint of chest pain.  Patient has a history of LAD stent.  Pain is in a similar location to where it was before but not nearly as severe.  Complaining of some generalized fatigue and aches.  EKG looks similar to prior.  No anemia.  Will obtain chest x-ray give aspirin nitro and discuss with cards.  I discussed the case with Dr. Mayford Knifeurner, cardiology.  She felt that since his symptoms were similar to last time his cath and he had no change in his disease burden that he likely could have a delta troponin.  She wanted to do a trial of a GI cocktail and if the delta was unchanged and the patient symptoms were completely resolved felt he could follow-up as an outpatient.  The patient also has a history of a DVT and has not been compliant with his Coumadin.  His INR returned and was normal.  This is concerning for possible pulmonary embolism and so a CT scan of the chest was performed.  Negative for PE or pneumonia.  Patient's delta returned and is unchanged.  Will have him follow-up as an outpatient with a cardiologist.  [name] Holland was evaluated in Emergency Department on 08/15/2018 for the symptoms described in the history of present illness. He/she was  evaluated in the context of the global COVID-19 pandemic, which necessitated consideration that the patient might be at risk for infection with the SARS-CoV-2 virus that causes COVID-19. Institutional protocols  and algorithms that pertain to the evaluation of patients at risk for COVID-19 are in a state of rapid change based on information released by regulatory bodies including the CDC and federal and state organizations. These policies and algorithms were followed during the patient's care in the ED.  3:18 PM:  I have discussed the diagnosis/risks/treatment options with the patient and believe the pt to be eligible for discharge home to follow-up with PCP, cards. We also discussed returning to the ED immediately if new or worsening sx occur. We discussed the sx which are most concerning (e.g., sudden worsening pain, fever, inability to tolerate by mouth) that necessitate immediate return. Medications administered to the patient during their visit and any new prescriptions provided to the patient are listed below.  Medications given during this visit Medications  nitroGLYCERIN (NITROSTAT) SL tablet 0.4 mg (0.4 mg Sublingual Given 08/15/18 1128)  aspirin chewable tablet 324 mg (324 mg Oral Given 08/15/18 1127)  alum & mag hydroxide-simeth (MAALOX/MYLANTA) 200-200-20 MG/5ML suspension 30 mL (30 mLs Oral Given 08/15/18 1236)  iohexol (OMNIPAQUE) 350 MG/ML injection 100 mL (100 mLs Intravenous Contrast Given 08/15/18 1259)     The patient appears reasonably screen and/or stabilized for discharge and I doubt any other medical condition or other [facility] requiring further screening, evaluation, or treatment in the ED at this time prior to discharge.    Final Clinical Impressions(s) / ED Diagnoses   Final diagnoses:  Atypical chest pain    ED Discharge Orders    None       Deno Etienne, DO 08/15/18 1518

## 2018-08-16 LAB — NOVEL CORONAVIRUS, NAA (HOSP ORDER, SEND-OUT TO REF LAB; TAT 18-24 HRS): SARS-CoV-2, NAA: NOT DETECTED

## 2018-11-16 ENCOUNTER — Emergency Department (HOSPITAL_COMMUNITY): Payer: Self-pay

## 2018-11-16 ENCOUNTER — Other Ambulatory Visit: Payer: Self-pay

## 2018-11-16 ENCOUNTER — Emergency Department (HOSPITAL_COMMUNITY)
Admission: EM | Admit: 2018-11-16 | Discharge: 2018-11-17 | Disposition: A | Discharge status: Home / Self Care | Payer: Self-pay | Attending: Emergency Medicine | Admitting: Emergency Medicine

## 2018-11-16 ENCOUNTER — Encounter (HOSPITAL_COMMUNITY): Payer: Self-pay

## 2018-11-16 DIAGNOSIS — R202 Paresthesia of skin: Secondary | ICD-10-CM

## 2018-11-16 DIAGNOSIS — Z7901 Long term (current) use of anticoagulants: Secondary | ICD-10-CM | POA: Insufficient documentation

## 2018-11-16 DIAGNOSIS — R200 Anesthesia of skin: Secondary | ICD-10-CM | POA: Insufficient documentation

## 2018-11-16 DIAGNOSIS — E119 Type 2 diabetes mellitus without complications: Secondary | ICD-10-CM | POA: Insufficient documentation

## 2018-11-16 DIAGNOSIS — Z955 Presence of coronary angioplasty implant and graft: Secondary | ICD-10-CM | POA: Insufficient documentation

## 2018-11-16 DIAGNOSIS — Z87891 Personal history of nicotine dependence: Secondary | ICD-10-CM | POA: Insufficient documentation

## 2018-11-16 DIAGNOSIS — I2510 Atherosclerotic heart disease of native coronary artery without angina pectoris: Secondary | ICD-10-CM | POA: Insufficient documentation

## 2018-11-16 DIAGNOSIS — R519 Headache, unspecified: Secondary | ICD-10-CM | POA: Insufficient documentation

## 2018-11-16 DIAGNOSIS — I252 Old myocardial infarction: Secondary | ICD-10-CM | POA: Insufficient documentation

## 2018-11-16 DIAGNOSIS — Z86718 Personal history of other venous thrombosis and embolism: Secondary | ICD-10-CM | POA: Insufficient documentation

## 2018-11-16 DIAGNOSIS — Z79899 Other long term (current) drug therapy: Secondary | ICD-10-CM | POA: Insufficient documentation

## 2018-11-16 LAB — RAPID URINE DRUG SCREEN, HOSP PERFORMED
Amphetamines: NOT DETECTED
Barbiturates: NOT DETECTED
Benzodiazepines: NOT DETECTED
Cocaine: NOT DETECTED
Opiates: NOT DETECTED
Tetrahydrocannabinol: NOT DETECTED

## 2018-11-16 LAB — COMPREHENSIVE METABOLIC PANEL
ALT: 21 U/L (ref 0–44)
AST: 21 U/L (ref 15–41)
Albumin: 4.3 g/dL (ref 3.5–5.0)
Alkaline Phosphatase: 59 U/L (ref 38–126)
Anion gap: 7 (ref 5–15)
BUN: 15 mg/dL (ref 6–20)
CO2: 26 mmol/L (ref 22–32)
Calcium: 8.8 mg/dL — ABNORMAL LOW (ref 8.9–10.3)
Chloride: 105 mmol/L (ref 98–111)
Creatinine, Ser: 0.9 mg/dL (ref 0.61–1.24)
GFR calc Af Amer: >60 mL/min (ref >60)
GFR calc non Af Amer: >60 mL/min (ref >60)
Glucose, Bld: 134 mg/dL — ABNORMAL HIGH (ref 70–99)
Potassium: 3.6 mmol/L (ref 3.5–5.1)
Sodium: 138 mmol/L (ref 135–145)
Total Bilirubin: 0.8 mg/dL (ref 0.3–1.2)
Total Protein: 7.2 g/dL (ref 6.5–8.1)

## 2018-11-16 LAB — CBC
HCT: 44 % (ref 39.0–52.0)
Hemoglobin: 14 g/dL (ref 13.0–17.0)
MCH: 29.3 pg (ref 26.0–34.0)
MCHC: 31.8 g/dL (ref 30.0–36.0)
MCV: 92.1 fL (ref 80.0–100.0)
Platelets: 162 10*3/uL (ref 150–400)
RBC: 4.78 MIL/uL (ref 4.22–5.81)
RDW: 13.7 % (ref 11.5–15.5)
WBC: 7.9 10*3/uL (ref 4.0–10.5)
nRBC: 0 % (ref 0.0–0.2)

## 2018-11-16 LAB — URINALYSIS, ROUTINE W REFLEX MICROSCOPIC
Bacteria, UA: NONE SEEN
Bilirubin Urine: NEGATIVE
Glucose, UA: NEGATIVE mg/dL
Hgb urine dipstick: NEGATIVE
Ketones, ur: NEGATIVE mg/dL
Leukocytes,Ua: NEGATIVE
Nitrite: NEGATIVE
Protein, ur: 30 mg/dL — AB
Specific Gravity, Urine: 1.033 — ABNORMAL HIGH (ref 1.005–1.030)
pH: 5 (ref 5.0–8.0)

## 2018-11-16 LAB — DIFFERENTIAL
Abs Immature Granulocytes: 0.03 10*3/uL (ref 0.00–0.07)
Basophils Absolute: 0.1 10*3/uL (ref 0.0–0.1)
Basophils Relative: 1 %
Eosinophils Absolute: 0.2 10*3/uL (ref 0.0–0.5)
Eosinophils Relative: 3 %
Immature Granulocytes: 0 %
Lymphocytes Relative: 28 %
Lymphs Abs: 2.3 10*3/uL (ref 0.7–4.0)
Monocytes Absolute: 0.5 10*3/uL (ref 0.1–1.0)
Monocytes Relative: 6 %
Neutro Abs: 4.9 10*3/uL (ref 1.7–7.7)
Neutrophils Relative %: 62 %

## 2018-11-16 LAB — CBG MONITORING, ED: Glucose-Capillary: 138 mg/dL — ABNORMAL HIGH (ref 70–99)

## 2018-11-16 LAB — I-STAT CHEM 8, ED
BUN: 15 mg/dL (ref 6–20)
Calcium, Ion: 1.19 mmol/L (ref 1.15–1.40)
Chloride: 103 mmol/L (ref 98–111)
Creatinine, Ser: 0.9 mg/dL (ref 0.61–1.24)
Glucose, Bld: 128 mg/dL — ABNORMAL HIGH (ref 70–99)
HCT: 43 % (ref 39.0–52.0)
Hemoglobin: 14.6 g/dL (ref 13.0–17.0)
Potassium: 3.7 mmol/L (ref 3.5–5.1)
Sodium: 140 mmol/L (ref 135–145)
TCO2: 24 mmol/L (ref 22–32)

## 2018-11-16 LAB — ETHANOL: Alcohol, Ethyl (B): <10 mg/dL (ref <10)

## 2018-11-16 LAB — PROTIME-INR
INR: 1 (ref 0.8–1.2)
Prothrombin Time: 13.1 seconds (ref 11.4–15.2)

## 2018-11-16 LAB — APTT: aPTT: 28 seconds (ref 24–36)

## 2018-11-16 MED ORDER — LORAZEPAM 2 MG/ML IJ SOLN
1.0000 mg | Freq: Once | INTRAMUSCULAR | Status: AC | PRN
  Administered 2018-11-16: 1 mg via INTRAVENOUS
  Filled 2018-11-16: qty 1

## 2018-11-16 MED ORDER — IOHEXOL 350 MG/ML SOLN
80.0000 mL | Freq: Once | INTRAVENOUS | Status: AC | PRN
  Administered 2018-11-16: 80 mL via INTRAVENOUS

## 2018-11-16 MED ORDER — DIPHENHYDRAMINE HCL 50 MG/ML IJ SOLN
12.5000 mg | Freq: Once | INTRAMUSCULAR | Status: AC
  Administered 2018-11-16: 12.5 mg via INTRAVENOUS
  Filled 2018-11-16: qty 1

## 2018-11-16 MED ORDER — SODIUM CHLORIDE 0.9% FLUSH
3.0000 mL | Freq: Once | INTRAVENOUS | Status: AC
Start: 2018-11-16 — End: 2018-11-16
  Administered 2018-11-16: 12:00:00 3 mL via INTRAVENOUS

## 2018-11-16 MED ORDER — KETOROLAC TROMETHAMINE 15 MG/ML IJ SOLN
15.0000 mg | Freq: Once | INTRAMUSCULAR | Status: AC
  Administered 2018-11-16: 15 mg via INTRAVENOUS
  Filled 2018-11-16: qty 1

## 2018-11-16 MED ORDER — ONDANSETRON HCL 4 MG/2ML IJ SOLN
4.0000 mg | Freq: Once | INTRAMUSCULAR | Status: AC
  Administered 2018-11-16: 4 mg via INTRAVENOUS
  Filled 2018-11-16: qty 2

## 2018-11-16 MED ORDER — LORAZEPAM 2 MG/ML IJ SOLN
1.0000 mg | Freq: Once | INTRAMUSCULAR | Status: AC
  Administered 2018-11-16: 1 mg via INTRAVENOUS
  Filled 2018-11-16: qty 1

## 2018-11-16 MED ORDER — HYDROMORPHONE HCL 1 MG/ML IJ SOLN
1.0000 mg | Freq: Once | INTRAMUSCULAR | Status: AC
  Administered 2018-11-16: 1 mg via INTRAVENOUS
  Filled 2018-11-16: qty 1

## 2018-11-16 MED ORDER — IOHEXOL 300 MG/ML  SOLN: 100.0000 mL | Freq: Once | INTRAMUSCULAR | Status: DC | PRN

## 2018-11-16 MED ORDER — PROCHLORPERAZINE EDISYLATE 10 MG/2ML IJ SOLN
10.0000 mg | Freq: Once | INTRAMUSCULAR | Status: AC
  Administered 2018-11-16: 10 mg via INTRAVENOUS
  Filled 2018-11-16: qty 2

## 2018-11-16 NOTE — ED Notes (Signed)
Pt transported to CT at this time.

## 2018-11-16 NOTE — ED Notes (Signed)
MRI notified of pts weight.

## 2018-11-16 NOTE — ED Notes (Signed)
Care Handoff provided to Rich Fuchs RN at [facility] ED. [facility] contacted for transport.

## 2018-11-16 NOTE — ED Provider Notes (Signed)
Pt transferred from [facility] for facial numbness.  CT scans earlier without acute findings. Sent to [facility] for MRI as pt did not fit in the MRI at [facility].  Pt still having a headache.   Pt was given dilaudid and toradol.  Compazine and benadryl ordered.   Dorie Rank, MD 11/16/18 2209

## 2018-11-16 NOTE — ED Notes (Signed)
Per MRI: Pt was unable to fit/tolerate MRI due to size. MD aware

## 2018-11-16 NOTE — ED Notes (Signed)
Report given to [facility]. [facility] ED made aware that patient is en route.

## 2018-11-16 NOTE — ED Triage Notes (Signed)
Pt presents with c/o facial numbness that started since around 7:30 this morning. Pt also c/o head pain in the back of his head that started at 7 this morning. Pt has a hx of a blood clot in his head.

## 2018-11-16 NOTE — ED Notes (Signed)
Pt here for an MRI for a possible  TIA. Pt was ambulatory to the stretcher with [facility].

## 2018-11-16 NOTE — ED Provider Notes (Signed)
[facility] DEPT Provider Note   CSN: [id number] Arrival date & time: 11/16/18  1129     History   Chief Complaint Chief Complaint  Patient presents with   Facial numbness    HPI [name] Holland is a 48 y.o. male.     Patient with acute onset of left occipital headache and left-sided facial numbness at 730 this morning while at the gym and lifting weights.  This is very similar to in 2016 when he had a venous thrombosis.  Symptoms were almost identical.  Patient also stated that it is CVA at that time.  Patient is followed by [facility] neurology.  No visual changes no motor weakness no numbness to the upper extremities or lower extremities.  Headache is isolated just to the left occiput.  No speech problems.  Upon arrival patient was out of the TPA window plus symptoms were minimal.  He has a history and past of cocaine abuse in remission.  History of DVT.  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  Hyperlipidemia coronary artery disease.  And reportedly had a stroke in 2018.  In addition there is some question of some facial symmetry with the right side of his face around the mouth not moving properly.     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   Hyperlipidemia    Intracranial and intraspinal phlebitis and thrombophlebitis 05/31/2014   Myocardial infarction ([place]) 2016   "caused by blood clot in the left side of my head"   Stroke ([place]) 05/2014   denies residual on 11/23/2016   Type II diabetes mellitus [facility])      Patient Active Problem List   Diagnosis Date Noted   Unstable angina ([place])    Chest pain 07/17/2017   Coronary artery disease due to lipid rich plaque    Non-ST elevation (NSTEMI) myocardial infarction [facility])    History of DVT (deep vein thrombosis) 11/23/2016   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   Long term current use of anticoagulant 06/17/2016   Mixed hyperlipidemia 05/31/2016   Bilateral primary osteoarthritis of knee 04/21/2015   Cocaine abuse in remission ([place]) 04/21/2015   Obesity (BMI 30-39.9)    CVA (cerebral infarction) 05/31/2014   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, without long-term current use of insulin ([place]) 05/31/2014   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    Past Surgical History:  Procedure Laterality Date   CARDIAC CATHETERIZATION  2008   CORONARY STENT INTERVENTION N/A 11/24/2016   Procedure: CORONARY STENT INTERVENTION;  Surgeon: Burnell Blanks, MD;  Location: [facility] CV LAB;  Service: Cardiovascular;  Laterality: N/A;   CORONARY THROMBECTOMY N/A 11/24/2016   Procedure: Coronary Thrombectomy;  Surgeon: Burnell Blanks, MD;  Location: [facility] [place] CV LAB;  Service: Cardiovascular;  Laterality: N/A;   HIP SURGERY Right    KNEE ARTHROSCOPY Bilateral early 2000s   LEFT HEART CATH AND CORONARY ANGIOGRAPHY N/A 11/24/2016   Procedure: LEFT HEART CATH AND CORONARY ANGIOGRAPHY;  Surgeon: Kathleene Hazel, MD;  Location: [facility] INVASIVE CV LAB;  Service: Cardiovascular;  Laterality: N/A;   LEFT HEART CATH AND CORONARY ANGIOGRAPHY N/A 07/18/2017   Procedure: LEFT HEART CATH AND CORONARY ANGIOGRAPHY;  Surgeon: Yvonne Kendall, MD;  Location: [facility] INVASIVE CV LAB;  Service: Cardiovascular;  Laterality: N/A;   WRIST SURGERY Right         Home Medications    Prior to Admission medications   Medication Sig Start Date  End Date Taking? Authorizing Provider  atorvastatin (LIPITOR) 80 MG tablet Take 1 tablet (80 mg total) by mouth daily at 6 PM. 07/19/17   Allayne Butcher, PA-C  clopidogrel (PLAVIX) 75 MG tablet Take 1 tablet (75 mg total) by mouth daily. 07/19/17   Robbie Lis M, PA-C  diclofenac sodium (VOLTAREN) 1 % GEL Apply 4 g topically 4 (four) times daily. 09/02/17   Maczis, Elmer Sow, PA-C  HYDROcodone-acetaminophen (NORCO/VICODIN) 5-325 MG tablet Take 1 tablet by mouth every 6 (six) hours as needed for severe pain. 12/27/17   Caccavale, Sophia, PA-C  isosorbide mononitrate (IMDUR) 30 MG 24 hr tablet Take 0.5 tablets (15 mg total) by mouth daily. 07/20/17   Robbie Lis M, PA-C  lidocaine (LIDODERM) 5 % Place 1 patch onto the skin daily. Remove & Discard patch within 12 hours or as directed by MD 09/02/17   Maczis, Elmer Sow, PA-C  methocarbamol (ROBAXIN) 500 MG tablet Take 1 tablet (500 mg total) by mouth 2 (two) times daily. 09/02/17   Maczis, Elmer Sow, PA-C  nitroGLYCERIN (NITROSTAT) 0.4 MG SL tablet Place 1 tablet (0.4 mg total) under the tongue every 5 (five) minutes as needed for chest pain. Patient not taking: Reported on 07/17/2017 05/11/17 08/09/17  Jodelle Gross, NP  ondansetron (ZOFRAN ODT) 4 MG disintegrating tablet Take 1 tablet (4 mg total) by mouth every 8 (eight) hours as needed for nausea or vomiting. 12/27/17   Caccavale, Sophia, PA-C  rivaroxaban (XARELTO) 20 MG TABS tablet Take 1 tablet (20 mg total) by mouth daily with supper. Patient taking differently: Take 20 mg by mouth every morning.  07/19/17   Robbie Lis M, PA-C  warfarin (COUMADIN) 5 MG tablet Take 1 tablet (5 mg total) by mouth daily. [place] for INR follow up. Call clinic prior to next refill authorization. 05/25/18   End, Cristal Deer, MD    Family History Family History  Problem Relation Age of Onset   Other Mother    Diabetes type II Mother    Asthma Mother    Diabetes Mother    CAD Father         CABG   Hypertension Father    Hyperlipidemia Father    Pancreatic cancer Maternal Aunt    Lung cancer Maternal Aunt    CAD Maternal Grandmother    Cerebral aneurysm Paternal Grandmother    Cancer Paternal Grandfather        unknown    Social History Social History   Tobacco Use   Smoking status: Former Smoker    Packs/day: 1.00    Years: 24.00    Pack years: 24.00    Types: Cigarettes    Quit date: 2014    Years since quitting: 6.8   Smokeless tobacco: Current User    Types: Snuff  Substance Use Topics   Alcohol use: No   Drug use: No    Types: Cocaine     Allergies   Formaldehyde   Review of Systems Review of Systems  Constitutional: Negative  for chills and fever.  HENT: Negative for rhinorrhea and sore throat.   Eyes: Negative for visual disturbance.  Respiratory: Negative for cough and shortness of breath.   Cardiovascular: Negative for chest pain and leg swelling.  Gastrointestinal: Negative for abdominal pain, diarrhea, nausea and vomiting.  Genitourinary: Negative for dysuria.  Musculoskeletal: Negative for back pain and neck pain.  Skin: Negative for rash.  Neurological: Positive for facial asymmetry, numbness and headaches. Negative for dizziness, seizures, syncope, speech difficulty, weakness and light-headedness.  Hematological: Does not bruise/bleed easily.  Psychiatric/Behavioral: Negative for confusion.     Physical Exam Updated Vital Signs BP 127/82    Pulse (!) 55    Temp 98.5 F (36.9 C) (Oral)    Resp 14    Wt (!) 141.7 kg    SpO2 99%    BMI 41.20 kg/m   Physical Exam Vitals signs and nursing note reviewed.  Constitutional:      General: He is not in acute distress.    Appearance: Normal appearance. He is well-developed.  HENT:     Head: Normocephalic and atraumatic.     Mouth/Throat:     Mouth: Mucous membranes are moist.  Eyes:     Extraocular Movements: Extraocular movements intact.     Conjunctiva/sclera:  Conjunctivae normal.     Pupils: Pupils are equal, round, and reactive to light.  Neck:     Musculoskeletal: Normal range of motion and neck supple.  Cardiovascular:     Rate and Rhythm: Normal rate and regular rhythm.     Heart sounds: No murmur.  Pulmonary:     Effort: Pulmonary effort is normal. No respiratory distress.     Breath sounds: Normal breath sounds.  Abdominal:     Palpations: Abdomen is soft.     Tenderness: There is no abdominal tenderness.  Musculoskeletal:        General: No swelling.  Skin:    General: Skin is warm and dry.     Capillary Refill: Capillary refill takes less than 2 seconds.  Neurological:     Mental Status: He is alert and oriented to person, place, and time.     Cranial Nerves: Cranial nerve deficit present.     Sensory: Sensory deficit present.     Motor: No weakness.     Coordination: Coordination normal.      ED Treatments / Results  Labs (all labs ordered are listed, but only abnormal results are displayed) Labs Reviewed  COMPREHENSIVE METABOLIC PANEL - Abnormal; Notable for the following components:      Result Value   Glucose, Bld 134 (*)    Calcium 8.8 (*)    All other components within normal limits  URINALYSIS, ROUTINE W REFLEX MICROSCOPIC - Abnormal; Notable for the following components:   Specific Gravity, Urine 1.033 (*)    Protein, ur 30 (*)    All other components within normal limits  I-STAT CHEM 8, ED - Abnormal; Notable for the following components:   Glucose, Bld 128 (*)    All other components within normal limits  CBG MONITORING, ED - Abnormal; Notable for the following components:   Glucose-Capillary 138 (*)    All other components within normal limits  ETHANOL  PROTIME-INR  APTT  CBC  DIFFERENTIAL  RAPID URINE DRUG SCREEN, HOSP PERFORMED  I-STAT CHEM 8, ED    EKG EKG Interpretation  Date/Time:  Thursday November 16 2018 12:00:25 EST Ventricular Rate:  62 PR Interval:    QRS Duration: 105  QT  Interval:  404 QTC Calculation: 411 R Axis:   65 Text Interpretation: Sinus rhythm Confirmed by Vanetta MuldersZackowski, Argel Pablo [phone]) on 11/16/2018 12:19:02 PM   Radiology Ct Head Wo Contrast  Result Date: 11/16/2018 CLINICAL DATA:  48 year old male with facial numbness and posterior headache. EXAM: CT HEAD WITHOUT CONTRAST TECHNIQUE: Contiguous axial images were obtained from the base of the skull through the vertex without intravenous contrast. COMPARISON:  Head CT dated 05/12/2017 FINDINGS: Brain: Mild cortical atrophy. The ventricles are appropriate size for patient's age. The gray-white matter discrimination is preserved. There is no acute intracranial hemorrhage. No mass effect or midline shift. No extra-axial fluid collection. Vascular: No hyperdense vessel or unexpected calcification. Skull: Normal. Negative for fracture or focal lesion. Sinuses/Orbits: No acute finding. Other: The previously described hypodensity in the left transverse sinus is less conspicuous on today's exam. IMPRESSION: Unremarkable noncontrast CT of the brain.  No acute findings. Electronically Signed   By: Elgie CollardArash  Radparvar M.D.   On: 11/16/2018 14:48    Procedures Procedures (including critical care time)  CRITICAL CARE Performed by: Vanetta MuldersScott Cheyene Hamric Total critical care time: 30 minutes Critical care time was exclusive of separately billable procedures and treating other patients. Critical care was necessary to treat or prevent imminent or life-threatening deterioration. Critical care was time spent personally by me on the following activities: development of treatment plan with patient and/or surrogate as well as nursing, discussions with consultants, evaluation of patient's response to treatment, examination of patient, obtaining history from patient or surrogate, ordering and performing treatments and interventions, ordering and review of laboratory studies, ordering and review of radiographic studies, pulse oximetry and  re-evaluation of patient's condition.   Medications Ordered in ED Medications  iohexol (OMNIPAQUE) 300 MG/ML solution 100 mL (has no administration in time range)  sodium chloride flush (NS) 0.9 % injection 3 mL (3 mLs Intravenous Given by Other 11/16/18 1207)  HYDROmorphone (DILAUDID) injection 1 mg (1 mg Intravenous Given 11/16/18 1319)  ondansetron (ZOFRAN) injection 4 mg (4 mg Intravenous Given 11/16/18 1319)  HYDROmorphone (DILAUDID) injection 1 mg (1 mg Intravenous Given 11/16/18 1555)  LORazepam (ATIVAN) injection 1 mg (1 mg Intravenous Given 11/16/18 1706)  iohexol (OMNIPAQUE) 350 MG/ML injection 80 mL (80 mLs Intravenous Contrast Given 11/16/18 1800)     Initial Impression / Assessment and Plan / ED Course  I have reviewed the triage vital signs and the nursing notes.  Pertinent labs & imaging results that were available during my care of the patient were reviewed by me and considered in my medical decision making (see chart for details).        Patient symptoms consistent with a central venous thrombosis that occurred in 2016.  Not a candidate for TPA.  Still had some facial numbness and had some questionable facial asymmetry on the right side.  And he complained of a headache in the left occiput area.  Head CT without any acute findings.  Discussed with Dr. Cameron Aliohr neuro hospitalist.  Recommending that we get CT angio head neck and as well as a CT angio venogram and MRI brain.  All ordered.  Any abnormalities need to give them a call back very things completely normal and patient develops no new symptoms could be discharged home.  Final Clinical Impressions(s) / ED Diagnoses   Final diagnoses:  Acute nonintractable headache, unspecified headache type  Left facial numbness    ED Discharge Orders    None       Vanetta MuldersZackowski, Emslee Lopezmartinez, MD 11/16/18  1814 ° °

## 2018-11-16 NOTE — ED Notes (Signed)
Patient aware of urine need 

## 2018-11-16 NOTE — Progress Notes (Signed)
Phone call note  Called by Dr. Venita Sheffield regarding this patient.  History of dural venous sinus thrombosis was on Xarelto. Recurrence of similar symptoms as 2016 in 2018. Needed phone consult and recommendations on imaging modality. Recommended CTA, CTV and MRI of the brain. Call back after imaging as needed. If imaging is positive for stroke or dural venous sinus thrombosis, he will need to be transferred to [facility] for further management. I relayed my plan to Dr. Rogene Houston   -- Amie Portland, MD [facility] Pager: [phone] If 7pm to 7am, please call on call as listed on AMION.

## 2018-11-16 NOTE — ED Provider Notes (Signed)
Brief sign out note  48 y/o known hx stroke, venous thrombus. Discussed with Rory Percy.   Follow up on CTA angio head/neck, CT venogram, MRI.  Any acute findings, likely admit. If all negative and remains asymptomatic discharge   4:53 PM receive signout from [facility]  7:47 PM reviewed CTs, no acute findings, possible chronic thrombus unchanged from prior 2019 scan. Updated patient. No recurrence of neuro symptoms. Patient unable to tolerate our MRI here. MRI team feels patient should be able to tolerate MRI at [facility] though. Discussed with Dr. Ralene Bathe at [facility] who will accept patient ER to ER.   Lucrezia Starch, MD 11/16/18 1949

## 2018-11-16 NOTE — ED Notes (Signed)
[facility] at bedside. Patient ambulatory to restroom with no assistance and steady gait.

## 2018-11-16 NOTE — ED Provider Notes (Signed)
11:50 PM  Assumed care from Dr. Tomi Bamberger.  CT, CTA, CTV negative.  MRI brain pending.  Patient having HA and numbness L face.  Transferred from [facility].    1:35 AM  Pt's MRI of the brain shows no acute abnormality.  His headache is now resolved.  Will discharge home.   At this time, I do not feel there is any life-threatening condition present. I have reviewed, interpreted and discussed all results (EKG, imaging, lab, urine as appropriate) and exam findings with patient/family. I have reviewed nursing notes and appropriate previous records.  I feel the patient is safe to be discharged home without further emergent workup and can continue workup as an outpatient as needed. Discussed usual and customary return precautions. Patient/family verbalize understanding and are comfortable with this plan.  Outpatient follow-up has been provided as needed. All questions have been answered.    [name] Holland, Delice Bison, DO 11/17/18 [phone]

## 2018-11-16 NOTE — ED Notes (Signed)
Pt transported to MRI at this time 

## 2018-11-17 NOTE — ED Notes (Signed)
Patient verbalized understanding of dc instructions, vss, ambulatory with nad. Significant other outside to take patient home.

## 2018-11-17 NOTE — Discharge Instructions (Signed)
Your labs, urine, brain MRI today were normal.  I recommend follow-up with neurology as an outpatient.  Your symptoms may have been caused from a complex migraine.  Your MRI did not show a stroke today.  You may alternate Tylenol 1000 mg every 6 hours as needed for pain and Ibuprofen 800 mg every 8 hours as needed for pain.  Please take Ibuprofen with food.

## 2018-11-20 ENCOUNTER — Other Ambulatory Visit: Payer: Self-pay | Admitting: Pharmacist

## 2018-11-20 MED ORDER — APIXABAN 5 MG PO TABS: 5.0000 mg | ORAL_TABLET | Freq: Two times a day (BID) | ORAL | 0 refills | Status: DC

## 2018-11-20 MED ORDER — RIVAROXABAN 20 MG PO TABS: 20.0000 mg | ORAL_TABLET | Freq: Every day | ORAL | 1 refills | Status: DC

## 2018-11-20 NOTE — Telephone Encounter (Signed)
Last warfarin dose was over 2 months ago, no eliquis or xarelto available right now.   Noted ED visit d/t TIA on Nov/05/2018.   Will provide Eliquis 5mg  samples today (Xarelto samples not available), then transition to Xarelto Rx once able to afford.

## 2018-11-21 ENCOUNTER — Ambulatory Visit: Payer: Self-pay | Admitting: Pharmacist Clinician (PhC)/ Clinical Pharmacy Specialist

## 2018-11-21 DIAGNOSIS — Z86718 Personal history of other venous thrombosis and embolism: Secondary | ICD-10-CM

## 2019-01-19 ENCOUNTER — Ambulatory Visit: Payer: Self-pay | Admitting: Cardiovascular Disease

## 2019-02-28 ENCOUNTER — Encounter (HOSPITAL_BASED_OUTPATIENT_CLINIC_OR_DEPARTMENT_OTHER): Payer: Self-pay | Admitting: Emergency Medicine

## 2019-02-28 ENCOUNTER — Emergency Department (HOSPITAL_BASED_OUTPATIENT_CLINIC_OR_DEPARTMENT_OTHER): Payer: Self-pay

## 2019-02-28 ENCOUNTER — Other Ambulatory Visit: Payer: Self-pay

## 2019-02-28 ENCOUNTER — Observation Stay (HOSPITAL_BASED_OUTPATIENT_CLINIC_OR_DEPARTMENT_OTHER)
Admission: EM | Admit: 2019-02-28 | Discharge: 2019-03-01 | Disposition: A | Discharge status: Home / Self Care | Payer: Self-pay | Attending: Cardiovascular Disease | Admitting: Cardiovascular Disease

## 2019-02-28 DIAGNOSIS — Z833 Family history of diabetes mellitus: Secondary | ICD-10-CM | POA: Insufficient documentation

## 2019-02-28 DIAGNOSIS — I10 Essential (primary) hypertension: Secondary | ICD-10-CM | POA: Insufficient documentation

## 2019-02-28 DIAGNOSIS — Z7901 Long term (current) use of anticoagulants: Secondary | ICD-10-CM | POA: Insufficient documentation

## 2019-02-28 DIAGNOSIS — I200 Unstable angina: Secondary | ICD-10-CM

## 2019-02-28 DIAGNOSIS — I959 Hypotension, unspecified: Secondary | ICD-10-CM | POA: Insufficient documentation

## 2019-02-28 DIAGNOSIS — I25110 Atherosclerotic heart disease of native coronary artery with unstable angina pectoris: Principal | ICD-10-CM | POA: Insufficient documentation

## 2019-02-28 DIAGNOSIS — Z8249 Family history of ischemic heart disease and other diseases of the circulatory system: Secondary | ICD-10-CM | POA: Insufficient documentation

## 2019-02-28 DIAGNOSIS — Z20822 Contact with and (suspected) exposure to covid-19: Secondary | ICD-10-CM | POA: Insufficient documentation

## 2019-02-28 DIAGNOSIS — R079 Chest pain, unspecified: Secondary | ICD-10-CM

## 2019-02-28 DIAGNOSIS — Z955 Presence of coronary angioplasty implant and graft: Secondary | ICD-10-CM | POA: Insufficient documentation

## 2019-02-28 DIAGNOSIS — Z6838 Body mass index (BMI) 38.0-38.9, adult: Secondary | ICD-10-CM | POA: Insufficient documentation

## 2019-02-28 DIAGNOSIS — I420 Dilated cardiomyopathy: Secondary | ICD-10-CM | POA: Insufficient documentation

## 2019-02-28 DIAGNOSIS — I77819 Aortic ectasia, unspecified site: Secondary | ICD-10-CM | POA: Insufficient documentation

## 2019-02-28 DIAGNOSIS — M170 Bilateral primary osteoarthritis of knee: Secondary | ICD-10-CM | POA: Insufficient documentation

## 2019-02-28 DIAGNOSIS — E1149 Type 2 diabetes mellitus with other diabetic neurological complication: Secondary | ICD-10-CM | POA: Insufficient documentation

## 2019-02-28 DIAGNOSIS — R55 Syncope and collapse: Secondary | ICD-10-CM

## 2019-02-28 DIAGNOSIS — R7989 Other specified abnormal findings of blood chemistry: Secondary | ICD-10-CM

## 2019-02-28 DIAGNOSIS — I2510 Atherosclerotic heart disease of native coronary artery without angina pectoris: Secondary | ICD-10-CM | POA: Diagnosis present

## 2019-02-28 DIAGNOSIS — Z86718 Personal history of other venous thrombosis and embolism: Secondary | ICD-10-CM | POA: Insufficient documentation

## 2019-02-28 DIAGNOSIS — E669 Obesity, unspecified: Secondary | ICD-10-CM | POA: Insufficient documentation

## 2019-02-28 DIAGNOSIS — Z8673 Personal history of transient ischemic attack (TIA), and cerebral infarction without residual deficits: Secondary | ICD-10-CM | POA: Insufficient documentation

## 2019-02-28 DIAGNOSIS — E782 Mixed hyperlipidemia: Secondary | ICD-10-CM | POA: Insufficient documentation

## 2019-02-28 DIAGNOSIS — D6859 Other primary thrombophilia: Secondary | ICD-10-CM

## 2019-02-28 DIAGNOSIS — I252 Old myocardial infarction: Secondary | ICD-10-CM | POA: Insufficient documentation

## 2019-02-28 DIAGNOSIS — R946 Abnormal results of thyroid function studies: Secondary | ICD-10-CM | POA: Insufficient documentation

## 2019-02-28 DIAGNOSIS — Z79899 Other long term (current) drug therapy: Secondary | ICD-10-CM | POA: Insufficient documentation

## 2019-02-28 DIAGNOSIS — Z87891 Personal history of nicotine dependence: Secondary | ICD-10-CM | POA: Insufficient documentation

## 2019-02-28 LAB — CBC
HCT: 45.2 % (ref 39.0–52.0)
Hemoglobin: 14.7 g/dL (ref 13.0–17.0)
MCH: 28.7 pg (ref 26.0–34.0)
MCHC: 32.5 g/dL (ref 30.0–36.0)
MCV: 88.3 fL (ref 80.0–100.0)
Platelets: 184 10*3/uL (ref 150–400)
RBC: 5.12 MIL/uL (ref 4.22–5.81)
RDW: 14.2 % (ref 11.5–15.5)
WBC: 8.4 10*3/uL (ref 4.0–10.5)
nRBC: 0 % (ref 0.0–0.2)

## 2019-02-28 LAB — BASIC METABOLIC PANEL
Anion gap: 9 (ref 5–15)
BUN: 20 mg/dL (ref 6–20)
CO2: 23 mmol/L (ref 22–32)
Calcium: 9.4 mg/dL (ref 8.9–10.3)
Chloride: 103 mmol/L (ref 98–111)
Creatinine, Ser: 1.04 mg/dL (ref 0.61–1.24)
GFR calc Af Amer: >60 mL/min (ref >60)
GFR calc non Af Amer: >60 mL/min (ref >60)
Glucose, Bld: 96 mg/dL (ref 70–99)
Potassium: 3.6 mmol/L (ref 3.5–5.1)
Sodium: 135 mmol/L (ref 135–145)

## 2019-02-28 LAB — TROPONIN I (HIGH SENSITIVITY)
Troponin I (High Sensitivity): 4 ng/L (ref <18)
Troponin I (High Sensitivity): 5 ng/L (ref <18)

## 2019-02-28 LAB — RESPIRATORY PANEL BY RT PCR (FLU A&B, COVID)
Influenza A by PCR: NEGATIVE
Influenza B by PCR: NEGATIVE
SARS Coronavirus 2 by RT PCR: NEGATIVE

## 2019-02-28 LAB — PROTIME-INR
INR: 1.1 (ref 0.8–1.2)
Prothrombin Time: 13.8 seconds (ref 11.4–15.2)

## 2019-02-28 LAB — HEPARIN LEVEL (UNFRACTIONATED): Heparin Unfractionated: 0.1 IU/mL — ABNORMAL LOW (ref 0.30–0.70)

## 2019-02-28 LAB — APTT: aPTT: 34 seconds (ref 24–36)

## 2019-02-28 MED ORDER — MORPHINE SULFATE (PF) 4 MG/ML IV SOLN
INTRAVENOUS | Status: AC
  Filled 2019-02-28: qty 1

## 2019-02-28 MED ORDER — HEPARIN (PORCINE) 25000 UT/250ML-% IV SOLN
2000.0000 [IU]/h | INTRAVENOUS | Status: DC
  Administered 2019-02-28: 1500 [IU]/h via INTRAVENOUS
  Filled 2019-02-28 (×2): qty 250

## 2019-02-28 MED ORDER — MORPHINE SULFATE (PF) 4 MG/ML IV SOLN
6.0000 mg | Freq: Once | INTRAVENOUS | Status: AC
  Administered 2019-02-28: 6 mg via INTRAVENOUS
  Filled 2019-02-28: qty 2

## 2019-02-28 MED ORDER — ATORVASTATIN CALCIUM 80 MG PO TABS
80.0000 mg | ORAL_TABLET | Freq: Every day | ORAL | Status: DC
  Administered 2019-03-01: 80 mg via ORAL
  Filled 2019-02-28: qty 1

## 2019-02-28 MED ORDER — CLOPIDOGREL BISULFATE 75 MG PO TABS
75.0000 mg | ORAL_TABLET | Freq: Every day | ORAL | Status: DC
  Administered 2019-03-01: 75 mg via ORAL
  Filled 2019-02-28: qty 1

## 2019-02-28 MED ORDER — ASPIRIN 81 MG PO CHEW
324.0000 mg | CHEWABLE_TABLET | Freq: Once | ORAL | Status: AC
  Administered 2019-02-28: 324 mg via ORAL
  Filled 2019-02-28: qty 4

## 2019-02-28 MED ORDER — ASPIRIN EC 81 MG PO TBEC
81.0000 mg | DELAYED_RELEASE_TABLET | Freq: Every day | ORAL | Status: DC
  Administered 2019-03-01: 81 mg via ORAL
  Filled 2019-02-28: qty 1

## 2019-02-28 MED ORDER — ACETAMINOPHEN 325 MG PO TABS: 650.0000 mg | ORAL_TABLET | ORAL | Status: DC | PRN

## 2019-02-28 MED ORDER — ONDANSETRON HCL 4 MG/2ML IJ SOLN: 4.0000 mg | Freq: Four times a day (QID) | INTRAMUSCULAR | Status: DC | PRN

## 2019-02-28 MED ORDER — HYDROMORPHONE HCL 1 MG/ML IJ SOLN
1.0000 mg | Freq: Once | INTRAMUSCULAR | Status: AC
  Administered 2019-02-28: 1 mg via INTRAVENOUS
  Filled 2019-02-28: qty 1

## 2019-02-28 MED ORDER — NITROGLYCERIN 0.4 MG SL SUBL: 0.4000 mg | SUBLINGUAL_TABLET | SUBLINGUAL | Status: DC | PRN

## 2019-02-28 MED ORDER — INSULIN ASPART 100 UNIT/ML ~~LOC~~ SOLN: 0.0000 [IU] | Freq: Every day | SUBCUTANEOUS | Status: DC

## 2019-02-28 MED ORDER — ISOSORBIDE MONONITRATE ER 30 MG PO TB24
15.0000 mg | ORAL_TABLET | Freq: Every day | ORAL | Status: DC
  Administered 2019-03-01: 15 mg via ORAL
  Filled 2019-02-28: qty 1

## 2019-02-28 MED ORDER — MORPHINE SULFATE (PF) 2 MG/ML IV SOLN
2.0000 mg | INTRAVENOUS | Status: DC | PRN
  Administered 2019-03-01: 2 mg via INTRAVENOUS
  Filled 2019-02-28: qty 1

## 2019-02-28 MED ORDER — HEPARIN BOLUS VIA INFUSION
2000.0000 [IU] | Freq: Once | INTRAVENOUS | Status: AC
  Administered 2019-02-28: 2000 [IU] via INTRAVENOUS
  Filled 2019-02-28: qty 2000

## 2019-02-28 MED ORDER — INSULIN ASPART 100 UNIT/ML ~~LOC~~ SOLN: 0.0000 [IU] | Freq: Three times a day (TID) | SUBCUTANEOUS | Status: DC

## 2019-02-28 MED ORDER — HEPARIN BOLUS VIA INFUSION
4000.0000 [IU] | Freq: Once | INTRAVENOUS | Status: AC
  Administered 2019-02-28: 4000 [IU] via INTRAVENOUS

## 2019-02-28 MED ORDER — HYDROCODONE-ACETAMINOPHEN 5-325 MG PO TABS
1.0000 | ORAL_TABLET | Freq: Four times a day (QID) | ORAL | Status: DC | PRN
  Administered 2019-03-01: 1 via ORAL
  Filled 2019-02-28 (×2): qty 1

## 2019-02-28 NOTE — ED Triage Notes (Signed)
Pt reports right chest pain radiating to right arm pain , was exercising. Feeling weak, Hx DVT, heart stent.

## 2019-02-28 NOTE — Progress Notes (Signed)
ANTICOAGULATION CONSULT NOTE - Initial Consult  Pharmacy Consult for heparin Indication: chest pain/ACS  Allergies  Allergen Reactions  . Formaldehyde     Patient Measurements: Height: 6\' 1"  (185.4 cm) Weight: 288 lb (130.6 kg) IBW/kg (Calculated) : 79.9 Heparin Dosing Weight: 109  Vital Signs: Temp: 98.3 F (36.8 C) (02/17 0943) Temp Source: Oral (02/17 0943) BP: 108/69 (02/17 1335) Pulse Rate: 78 (02/17 1335)  Labs: Recent Labs    02/28/19 0948 02/28/19 1206  HGB 14.7  --   HCT 45.2  --   PLT 184  --   CREATININE 1.04  --   TROPONINIHS 4 5    Estimated Creatinine Clearance: 124.4 mL/min (by C-G formula based on SCr of 1.04 mg/dL).   Medical History: Past Medical History:  Diagnosis Date  . Acute deep vein thrombosis (DVT) of femoral vein (HCC)    dVT  . Arthritis    "knees" (11/23/2016)  . Clotting disorder (HCC)   . Cocaine abuse in remission (HCC) 2014  . Coronary artery disease due to lipid rich plaque   . DVT (deep venous thrombosis) (HCC) 05/2016   LLE  . 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 Hyperlipidemia   . Intracranial and intraspinal phlebitis and thrombophlebitis 05/31/2014  . Myocardial infarction (HCC) 2016   "caused by blood clot in the left side of my head"  . Stroke (HCC) 05/2014   denies residual on 11/23/2016  . Type II diabetes mellitus (HCC)     Medications:  Scheduled:  . heparin  4,000 Units Intravenous Once    Assessment: CP consistent with ACS, hx of cardiac stent  Goal of Therapy:  Heparin level 0.3-0.7 units/ml Monitor platelets by anticoagulation protocol: Yes   Plan:  Give 4000 units bolus x 1 Start heparin infusion at 1500 units/hr Check anti-Xa level in 6 hours and daily while on heparin Continue to monitor H&H and platelets  11/25/2016 A Kemet Nijjar 02/28/2019,1:49 PM

## 2019-02-28 NOTE — H&P (Signed)
Cardiology Admission History and Physical:   Patient ID: [name] Holland MRN: [id number]; DOB: May 01, 1970   Admission date: 02/28/2019  Primary Care Provider: Patient, No Pcp Per Primary Cardiologist: Lesleigh Noe, MD  Primary Electrophysiologist:  None   Chief Complaint:  CP/USA  Patient Profile:   [name] Holland is a 49 y.o. male with h/o CAD status post proximal LAD stenting in 2018 for high-grade proximal LAD lesion, history of clotting disorder and venous thrombosis x2(cerebral and lower extremity),diabetes, hyperlipidemia who transferred to [facility] from [facility] with chest pain.  History of Present Illness:   [name] Holland came to the ED c/o chest pain, onset earlier today. He describes it as a "dull, achy" pain in the center and across his chest, onset at rest but worse w/ exertion.  Radiation into his right shoulder and right arm.  Sometimes a pins-and-needles sensation in his right arm as well.  No weakness.  Associated with dyspnea.    He was actually working out earlier today w/o any sx; the sx started after his workout. He thought it was somewhat similar to angina he had before PCI, so he came to the ER. He has a past history of CAD as well as DVT and cerebral venous thrombosis.  He is supposed to be taking Xarelto but says he has been off of it for about a month because of financial constraints.  He reports compliance with his other medications.  Mild headache.  No acute visual changes.  No unusual leg pain or swelling. At [facility] ED, pt had HS trop negative x 2 and EKG w/o acute ischemic changes. He was transferred to [facility] with possible [place] for cath in the AM. CT head was done due to some neck pain; CT was negative for any acute abnormality at [facility].  Heart Pathway Score:     Past Medical History:  Diagnosis Date  . Acute deep vein thrombosis (DVT) of femoral vein (HCC)    dVT  . Arthritis    "knees" (11/23/2016)  . Clotting disorder (HCC)   . Cocaine abuse in remission  (HCC) 2014  . Coronary artery disease due to lipid rich plaque   . DVT (deep venous thrombosis) (HCC) 05/2016   LLE  . 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 Hyperlipidemia   . Intracranial and intraspinal phlebitis and thrombophlebitis 05/31/2014  . Myocardial infarction (HCC) 2016   "caused by blood clot in the left side of my head"  . Stroke (HCC) 05/2014   denies residual on 11/23/2016  . Type II diabetes mellitus (HCC)     Past Surgical History:  Procedure Laterality Date  . CARDIAC CATHETERIZATION  2008  . CORONARY STENT INTERVENTION N/A 11/24/2016   Procedure: CORONARY STENT INTERVENTION;  Surgeon: Kathleene Hazel, MD;  Location: [facility] INVASIVE CV LAB;  Service: Cardiovascular;  Laterality: N/A;  . CORONARY THROMBECTOMY N/A 11/24/2016   Procedure: Coronary Thrombectomy;  Surgeon: Kathleene Hazel, MD;  Location: [facility] INVASIVE CV LAB;  Service: Cardiovascular;  Laterality: N/A;  . HIP SURGERY Right   . KNEE ARTHROSCOPY Bilateral early 2000s  . LEFT HEART CATH AND CORONARY ANGIOGRAPHY N/A 11/24/2016   Procedure: LEFT HEART CATH AND CORONARY ANGIOGRAPHY;  Surgeon: Kathleene Hazel, MD;  Location: [facility] INVASIVE CV LAB;  Service: Cardiovascular;  Laterality: N/A;  . LEFT HEART CATH AND CORONARY ANGIOGRAPHY N/A 07/18/2017   Procedure: LEFT HEART CATH AND CORONARY ANGIOGRAPHY;  Surgeon: Yvonne Kendall, MD;  Location: [facility] CV LAB;  Service: Cardiovascular;  Laterality: N/A;  . WRIST SURGERY Right      Medications Prior to Admission: Prior to Admission medications   Medication Sig Start Date End Date Taking? Authorizing Provider  nitroGLYCERIN (NITROSTAT) 0.4 MG SL tablet Place 1 tablet (0.4 mg total) under the tongue every 5 (five) minutes as needed for chest pain. 05/11/17 02/28/19 Yes Lendon Colonel, NP  rivaroxaban (XARELTO) 20 MG TABS tablet Take 1 tablet (20 mg total) by mouth daily with supper.  11/20/18  Yes Lorretta Harp, MD  apixaban (ELIQUIS) 5 MG TABS tablet Take 1 tablet (5 mg total) by mouth 2 (two) times daily. ELIQUIS SAMPLES FOR 4 WEEKS UNTIL ABLE TO [facility] [place] PRESCRIPTION Patient not taking: Reported on 02/28/2019 11/20/18   Lorretta Harp, MD  atorvastatin (LIPITOR) 80 MG tablet Take 1 tablet (80 mg total) by mouth daily at 6 PM. Patient not taking: Reported on 02/28/2019 07/19/17   Consuelo Pandy, PA-C  clopidogrel (PLAVIX) 75 MG tablet Take 1 tablet (75 mg total) by mouth daily. Patient not taking: Reported on 02/28/2019 07/19/17   Lyda Jester M, PA-C  diclofenac sodium (VOLTAREN) 1 % GEL Apply 4 g topically 4 (four) times daily. Patient not taking: Reported on 02/28/2019 09/02/17   Jillyn Ledger, PA-C  HYDROcodone-acetaminophen (NORCO/VICODIN) 5-325 MG tablet Take 1 tablet by mouth every 6 (six) hours as needed for severe pain. Patient not taking: Reported on 02/28/2019 12/27/17   Caccavale, Sophia, PA-C  isosorbide mononitrate (IMDUR) 30 MG 24 hr tablet Take 0.5 tablets (15 mg total) by mouth daily. Patient not taking: Reported on 02/28/2019 07/20/17   Lyda Jester M, PA-C  lidocaine (LIDODERM) 5 % Place 1 patch onto the skin daily. Remove & Discard patch within 12 hours or as directed by MD Patient not taking: Reported on 02/28/2019 09/02/17   Maczis, Barth Kirks, PA-C  methocarbamol (ROBAXIN) 500 MG tablet Take 1 tablet (500 mg total) by mouth 2 (two) times daily. Patient not taking: Reported on 02/28/2019 09/02/17   Jillyn Ledger, PA-C  ondansetron (ZOFRAN ODT) 4 MG disintegrating tablet Take 1 tablet (4 mg total) by mouth every 8 (eight) hours as needed for nausea or vomiting. Patient not taking: Reported on 02/28/2019 12/27/17   Caccavale, Jillyn Ledger, PA-C     Allergies:    Allergies  Allergen Reactions  . Formaldehyde Hives    Social History:   Social History   Socioeconomic History  . Marital status: Single    Spouse name: Yadi  . Number  of children: 2  . Years of education: Not on file  . Highest education level: Not on file  Occupational History  . Occupation: Education officer, museum,     Fish farm manager: [employer]    Comment: substance abuse recovery  Tobacco Use  . Smoking status: Former Smoker    Packs/day: 1.00    Years: 24.00    Pack years: 24.00    Types: Cigarettes    Quit date: 2014    Years since quitting: 7.1  . Smokeless tobacco: Current User    Types: Snuff  Substance and Sexual Activity  . Alcohol use: No  . Drug use: No    Types: Cocaine  . Sexual activity: Not on file  Other Topics Concern  . Not on file  Social History Narrative   Married, 2 sons, substance abuse Production designer, theatre/television/film, sober from cocaine since 2014, exercises -weights, crossfit, trainer, healthy diet mostly (will intermittently  eat poorly and regain weight), former smoker, chews tobacco,    Social Determinants of Health   Financial Resource Strain:   . Difficulty of Paying Living Expenses: Not on file  Food Insecurity:   . Worried About Programme researcher, broadcasting/film/video in the Last Year: Not on file  . Ran Out of Food in the Last Year: Not on file  Transportation Needs:   . Lack of Transportation (Medical): Not on file  . Lack of Transportation (Non-Medical): Not on file  Physical Activity:   . Days of Exercise per Week: Not on file  . Minutes of Exercise per Session: Not on file  Stress:   . Feeling of Stress : Not on file  Social Connections:   . Frequency of Communication with Friends and Family: Not on file  . Frequency of Social Gatherings with Friends and Family: Not on file  . Attends Religious Services: Not on file  . Active Member of Clubs or Organizations: Not on file  . Attends Banker Meetings: Not on file  . Marital Status: Not on file  Intimate Partner Violence:   . Fear of Current or Ex-Partner: Not on file  . Emotionally Abused: Not on file  . Physically Abused: Not on file  . Sexually Abused: Not on file      Family History:   The patient's family history includes Asthma in his mother; CAD in his father and maternal grandmother; Cancer in his paternal grandfather; Cerebral aneurysm in his paternal grandmother; Diabetes in his mother; Diabetes type II in his mother; Hyperlipidemia in his father; Hypertension in his father; Lung cancer in his maternal aunt; Other in his mother; Pancreatic cancer in his maternal aunt.    ROS:  Please see the history of present illness.  All other ROS reviewed and negative.     Physical Exam/Data:   Vitals:   02/28/19 1700 02/28/19 1730 02/28/19 1800 02/28/19 2029  BP: (!) 112/59 124/72 113/62 (!) 115/58  Pulse: 69 71 77 61  Resp: 17 15 (!) 27 20  Temp:    98.1 F (36.7 C)  TempSrc:    Oral  SpO2: 98% 100% 96% 98%  Weight:    132.3 kg  Height:    6\' 1"  (1.854 m)   No intake or output data in the 24 hours ending 02/28/19 2316 Last 3 Weights 02/28/2019 02/28/2019 11/16/2018  Weight (lbs) 291 lb 11.2 oz 288 lb 312 lb 4.8 oz  Weight (kg) 132.314 kg 130.636 kg 141.658 kg     Body mass index is 38.49 kg/m.  General:  Well nourished, well developed, in no acute distress HEENT: normal Lymph: no adenopathy Neck: no JVD Endocrine:  No thryomegaly Vascular: No carotid bruits; DP pulses 2+ bilaterally  Cardiac:  normal S1, S2; RRR; no murmur  Lungs:  clear to auscultation bilaterally, no wheezing, rhonchi or rales  Abd: soft, nontender, no hepatomegaly  Ext: no edema Musculoskeletal:  No deformities, BUE and BLE strength normal and equal Skin: warm and dry  Neuro:  CNs 2-12 intact, no focal abnormalities noted Psych:  Normal affect    EKG:  The ECG that was done 02-28-19 was personally reviewed and demonstrates NSR w/o acute ST changes  Relevant CV Studies: 07-18-17 LHC Left Anterior Descending  Vessel is large.  Prox LAD-1 lesion 40% stenosed  Prox LAD-1 lesion is 40% stenosed.  Prox LAD-2 lesion 0% stenosed  Previously placed Prox LAD-2 drug eluting  stent is widely patent.  Left Circumflex  Vessel is large.  Second Obtuse Marginal Branch  Vessel is large in size.  Right Coronary Artery  Vessel is moderate in size.   TTE 07-18-17 - Left ventricle: The cavity size was normal. Wall thickness was  normal. Systolic function was normal. The estimated ejection  fraction was in the range of 55% to 60%. Wall motion was normal;  there were no regional wall motion abnormalities. Doppler  parameters are consistent with abnormal left ventricular  relaxation (grade 1 diastolic dysfunction).  - Mitral valve: Calcified annulus.   Impressions:  - Normal LV systolic function; mild diastolic dysfunction.   Laboratory Data:  High Sensitivity Troponin:   Recent Labs  Lab 02/28/19 0948 02/28/19 1206  TROPONINIHS 4 5      Chemistry Recent Labs  Lab 02/28/19 0948  NA 135  K 3.6  CL 103  CO2 23  GLUCOSE 96  BUN 20  CREATININE 1.04  CALCIUM 9.4  GFRNONAA >60  GFRAA >60  ANIONGAP 9    No results for input(s): PROT, ALBUMIN, AST, ALT, ALKPHOS, BILITOT in the last 168 hours. Hematology Recent Labs  Lab 02/28/19 0948  WBC 8.4  RBC 5.12  HGB 14.7  HCT 45.2  MCV 88.3  MCH 28.7  MCHC 32.5  RDW 14.2  PLT 184   BNPNo results for input(s): BNP, PROBNP in the last 168 hours.  DDimer No results for input(s): DDIMER in the last 168 hours.   Radiology/Studies:  DG Chest 2 View  Result Date: 02/28/2019 CLINICAL DATA:  Chest pain and shortness of breath EXAM: CHEST - 2 VIEW COMPARISON:  08/15/2018 FINDINGS: Cardiac shadow is within normal limits. The lungs are well aerated bilaterally. No focal infiltrate or sizable effusion is seen. No bony abnormality is noted. IMPRESSION: No active cardiopulmonary disease. Electronically Signed   By: Alcide Clever M.D.   On: 02/28/2019 10:24   CT Head Wo Contrast  Result Date: 02/28/2019 CLINICAL DATA:  Right-sided tingling. History of cerebral venous thrombosis. Numbness or tingling,  paresthesia. Additional history provided: Patient reports right chest pain radiating to right arm, weak, dizzy, lightheaded. EXAM: CT HEAD WITHOUT CONTRAST TECHNIQUE: Contiguous axial images were obtained from the base of the skull through the vertex without intravenous contrast. COMPARISON:  Brain MRI 11/16/2018, CT venogram 11/16/2018, CT angiogram head/neck 11/16/2018, noncontrast head CT 11/16/2018 FINDINGS: Brain: No evidence of acute intracranial hemorrhage. No demarcated cortical infarction. No evidence of intracranial mass. No midline shift or extra-axial fluid collection. Cerebral volume is normal for age. Vascular: No hyperdense vessel. Skull: Normal. Negative for fracture or focal lesion. Sinuses/Orbits: Visualized orbits demonstrate no acute abnormality. No significant paranasal sinus disease or mastoid effusion at the imaged levels. IMPRESSION: Unremarkable noncontrast CT appearance of the brain. No evidence of acute intracranial abnormality. Electronically Signed   By: Jackey Loge DO   On: 02/28/2019 10:43       TIMI Risk Score for Unstable Angina or Non-ST Elevation MI:   The patient's TIMI risk score is 4, which indicates a 20% risk of all cause mortality, new or recurrent myocardial infarction or need for urgent revascularization in the next 14 days.   Assessment and Plan:   1. CP/USA: will cont heparin IV gtt, make NPO for possible ischemia eval in the AM (by report, will probably be a non-invasive eval). NTG and Morphine PRN for CP; if he continues to have CP, will start NTG gtt. Risk stratification w/ TSH, A1c, FLP. TTE in the AM.  2. HTN/dyslipidemia: restart home  regimen and adjust PRN  3. H/o DVT/clotting d/o: hold xarelto while on therapeutic heparin gtt  4. DM2: glycemic control protocol. A1c pending  For questions or updates, please contact [facility] Please consult [email or web address] for contact info under        Signed, Precious Reel, MD, [facility] 02/28/2019  11:16 PM

## 2019-02-28 NOTE — ED Provider Notes (Signed)
[facility] EMERGENCY DEPARTMENT Provider Note   CSN: [id number] Arrival date & time: 02/28/19  9924     History Chief Complaint  Patient presents with  . Chest Pain  . Shortness of Breath    [name] Holland is a 49 y.o. male.  HPI   49 year old male with intermittent chest pain.  Onset Sunday.  Radiation into his right shoulder and right arm.  Sometimes a pins-and-needles sensation in his right arm as well.  No weakness.  Associated with dyspnea.  Symptoms worsened with exertion.  He was actually working out earlier today when symptoms worsened prompting him to come to the ER. He has a past history of CAD as well as DVT and cerebral venous thrombosis.  He is supposed to be taking Xarelto but says he has been off of it for about a month because of financial constraints.  He reports compliance with his other medications.  Mild headache.  No acute visual changes.  No unusual leg pain or swelling.  Past Medical History:  Diagnosis Date  . Acute deep vein thrombosis (DVT) of femoral vein (HCC)    dVT  . Arthritis    "knees" (11/23/2016)  . Clotting disorder (HCC)   . Cocaine abuse in remission (HCC) 2014  . Coronary artery disease due to lipid rich plaque   . DVT (deep venous thrombosis) (HCC) 05/2016   LLE  . 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 Hyperlipidemia   . Intracranial and intraspinal phlebitis and thrombophlebitis 05/31/2014  . Myocardial infarction (HCC) 2016   "caused by blood clot in the left side of my head"  . Stroke (HCC) 05/2014   denies residual on 11/23/2016  . Type II diabetes mellitus [facility])     Patient Active Problem List   Diagnosis Date Noted  . Unstable angina (HCC)   . Chest pain 07/17/2017  . Coronary artery disease due to lipid rich plaque   . Non-ST elevation (NSTEMI) myocardial infarction (HCC)   . Long term current use of anticoagulant 06/17/2016  . Mixed hyperlipidemia  05/31/2016  . Bilateral primary osteoarthritis of knee 04/21/2015  . Cocaine abuse in remission (HCC) 04/21/2015  . Obesity (BMI 30-39.9)   . 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 CARDIAC CATHETERIZATION  2008  . CORONARY STENT INTERVENTION N/A 11/24/2016   Procedure: CORONARY STENT INTERVENTION;  Surgeon: Kathleene Hazel, MD;  Location: [facility] INVASIVE CV LAB;  Service: Cardiovascular;  Laterality: N/A;  . CORONARY THROMBECTOMY N/A 11/24/2016   Procedure: Coronary Thrombectomy;  Surgeon: Kathleene Hazel, MD;  Location: [facility] INVASIVE CV LAB;  Service: Cardiovascular;  Laterality: N/A;  . HIP SURGERY Right   . KNEE ARTHROSCOPY Bilateral early 2000s  . LEFT HEART CATH AND CORONARY ANGIOGRAPHY N/A 11/24/2016   Procedure: LEFT HEART CATH AND CORONARY ANGIOGRAPHY;  Surgeon: Kathleene Hazel, MD;  Location: [facility] INVASIVE CV LAB;  Service: Cardiovascular;  Laterality: N/A;  . LEFT HEART CATH AND CORONARY ANGIOGRAPHY N/A 07/18/2017   Procedure: LEFT HEART CATH AND CORONARY ANGIOGRAPHY;  Surgeon: Yvonne Kendall, MD;  Location: [facility] INVASIVE CV LAB;  Service: Cardiovascular;  Laterality: N/A;  . WRIST SURGERY Right        Family History  Problem Relation Age of Onset  . Other  Mother   . Diabetes type II Mother   . Asthma Mother   . Diabetes Mother   . CAD Father        CABG  . Hypertension Father   . Hyperlipidemia Father   . Pancreatic cancer Maternal Aunt   . Lung cancer Maternal Aunt   . CAD Maternal Grandmother   . Cerebral aneurysm Paternal Grandmother   . Cancer Paternal Grandfather        unknown    Social History   Tobacco Use  . Smoking status: Former Smoker    Packs/day: 1.00    Years: 24.00    Pack years: 24.00     Types: Cigarettes    Quit date: 2014    Years since quitting: 7.1  . Smokeless tobacco: Current User    Types: Snuff  Substance Use Topics  . Alcohol use: No  . Drug use: No    Types: Cocaine    Home Medications Prior to Admission medications   Medication Sig Start Date End Date Taking? Authorizing Provider  apixaban (ELIQUIS) 5 MG TABS tablet Take 1 tablet (5 mg total) by mouth 2 (two) times daily. ELIQUIS SAMPLES FOR 4 WEEKS UNTIL ABLE TO AFFORD XARELTO PRESCRIPTION 11/20/18   Runell Gess, MD  atorvastatin (LIPITOR) 80 MG tablet Take 1 tablet (80 mg total) by mouth daily at 6 PM. 07/19/17   Allayne Butcher, PA-C  clopidogrel (PLAVIX) 75 MG tablet Take 1 tablet (75 mg total) by mouth daily. 07/19/17   Robbie Lis M, PA-C  diclofenac sodium (VOLTAREN) 1 % GEL Apply 4 g topically 4 (four) times daily. 09/02/17   Maczis, Elmer Sow, PA-C  HYDROcodone-acetaminophen (NORCO/VICODIN) 5-325 MG tablet Take 1 tablet by mouth every 6 (six) hours as needed for severe pain. 12/27/17   Caccavale, Sophia, PA-C  isosorbide mononitrate (IMDUR) 30 MG 24 hr tablet Take 0.5 tablets (15 mg total) by mouth daily. 07/20/17   Robbie Lis M, PA-C  lidocaine (LIDODERM) 5 % Place 1 patch onto the skin daily. Remove & Discard patch within 12 hours or as directed by MD 09/02/17   Maczis, Elmer Sow, PA-C  methocarbamol (ROBAXIN) 500 MG tablet Take 1 tablet (500 mg total) by mouth 2 (two) times daily. 09/02/17   Maczis, Elmer Sow, PA-C  nitroGLYCERIN (NITROSTAT) 0.4 MG SL tablet Place 1 tablet (0.4 mg total) under the tongue every 5 (five) minutes as needed for chest pain. Patient not taking: Reported on 07/17/2017 05/11/17 08/09/17  Jodelle Gross, NP  ondansetron (ZOFRAN ODT) 4 MG disintegrating tablet Take 1 tablet (4 mg total) by mouth every 8 (eight) hours as needed for nausea or vomiting. 12/27/17   Caccavale, Sophia, PA-C  rivaroxaban (XARELTO) 20 MG TABS tablet Take 1 tablet (20 mg total) by mouth  daily with supper. 11/20/18   Runell Gess, MD    Allergies    Formaldehyde  Review of Systems   Review of Systems All systems reviewed and negative, other than as noted in HPI.  Physical Exam Updated Vital Signs BP 113/75   Pulse 73   Temp 98.3 F (36.8 C) (Oral)   Resp (!) 21   Ht 6\' 1"  (1.854 m)   Wt 130.6 kg   SpO2 100%   BMI 38.00 kg/m   Physical Exam Vitals and nursing note reviewed.  Constitutional:      General: He is not in acute distress.    Appearance: He is well-developed.  HENT:  Head: Normocephalic and atraumatic.  Eyes:     General:        Right eye: No discharge.        Left eye: No discharge.     Conjunctiva/sclera: Conjunctivae normal.  Cardiovascular:     Rate and Rhythm: Normal rate and regular rhythm.     Heart sounds: Normal heart sounds. No murmur. No friction rub. No gallop.   Pulmonary:     Effort: Pulmonary effort is normal. No respiratory distress.     Breath sounds: Normal breath sounds.  Abdominal:     General: There is no distension.     Palpations: Abdomen is soft.     Tenderness: There is no abdominal tenderness.  Musculoskeletal:        General: No tenderness.     Cervical back: Neck supple.  Skin:    General: Skin is warm and dry.  Neurological:     General: No focal deficit present.     Mental Status: He is alert and oriented to person, place, and time.     Cranial Nerves: No cranial nerve deficit.     Sensory: No sensory deficit.     Coordination: Coordination normal.  Psychiatric:        Behavior: Behavior normal.        Thought Content: Thought content normal.     ED Results / Procedures / Treatments   Labs (all labs ordered are listed, but only abnormal results are displayed) Labs Reviewed  HEPARIN LEVEL (UNFRACTIONATED) - Abnormal; Notable for the following components:      Result Value   Heparin Unfractionated 0.10 (*)    All other components within normal limits  HEPARIN LEVEL (UNFRACTIONATED) -  Abnormal; Notable for the following components:   Heparin Unfractionated 0.13 (*)    All other components within normal limits  TSH - Abnormal; Notable for the following components:   TSH 4.612 (*)    All other components within normal limits  BASIC METABOLIC PANEL - Abnormal; Notable for the following components:   Glucose, Bld 121 (*)    All other components within normal limits  HEPARIN LEVEL (UNFRACTIONATED) - Abnormal; Notable for the following components:   Heparin Unfractionated 0.20 (*)    All other components within normal limits  LIPID PANEL - Abnormal; Notable for the following components:   HDL 30 (*)    LDL Cholesterol 136 (*)    All other components within normal limits  RESPIRATORY PANEL BY RT PCR (FLU A&B, COVID)  BASIC METABOLIC PANEL  CBC  APTT  PROTIME-INR  HIV ANTIBODY (ROUTINE TESTING W REFLEX)  HEMOGLOBIN A1C  CBC  PROTIME-INR  T4, FREE  GLUCOSE, CAPILLARY  GLUCOSE, CAPILLARY  GLUCOSE, CAPILLARY  GLUCOSE, CAPILLARY  T3, FREE  TROPONIN I (HIGH SENSITIVITY)  TROPONIN I (HIGH SENSITIVITY)  TROPONIN I (HIGH SENSITIVITY)  TROPONIN I (HIGH SENSITIVITY)    EKG EKG Interpretation  Date/Time:  Wednesday February 28 2019 09:43:44 EST Ventricular Rate:  72 PR Interval:    QRS Duration: 103 QT Interval:  373 QTC Calculation: 409 R Axis:   21 Text Interpretation: Sinus rhythm Baseline wander in lead(s) II Confirmed by Virgel Manifold [phone]) on 02/28/2019 10:23:22 AM   Radiology DG Chest 2 View  Result Date: 02/28/2019 CLINICAL DATA:  Chest pain and shortness of breath EXAM: CHEST - 2 VIEW COMPARISON:  08/15/2018 FINDINGS: Cardiac shadow is within normal limits. The lungs are well aerated bilaterally. No focal infiltrate or sizable effusion is seen. No  bony abnormality is noted. IMPRESSION: No active cardiopulmonary disease. Electronically Signed   By: Alcide Clever M.D.   On: 02/28/2019 10:24   CT Head Wo Contrast  Result Date: 02/28/2019 CLINICAL DATA:   Right-sided tingling. History of cerebral venous thrombosis. Numbness or tingling, paresthesia. Additional history provided: Patient reports right chest pain radiating to right arm, weak, dizzy, lightheaded. EXAM: CT HEAD WITHOUT CONTRAST TECHNIQUE: Contiguous axial images were obtained from the base of the skull through the vertex without intravenous contrast. COMPARISON:  Brain MRI 11/16/2018, CT venogram 11/16/2018, CT angiogram head/neck 11/16/2018, noncontrast head CT 11/16/2018 FINDINGS: Brain: No evidence of acute intracranial hemorrhage. No demarcated cortical infarction. No evidence of intracranial mass. No midline shift or extra-axial fluid collection. Cerebral volume is normal for age. Vascular: No hyperdense vessel. Skull: Normal. Negative for fracture or focal lesion. Sinuses/Orbits: Visualized orbits demonstrate no acute abnormality. No significant paranasal sinus disease or mastoid effusion at the imaged levels. IMPRESSION: Unremarkable noncontrast CT appearance of the brain. No evidence of acute intracranial abnormality. Electronically Signed   By: Jackey Loge DO   On: 02/28/2019 10:43    Procedures Procedures (including critical care time)  CRITICAL CARE Performed by: Raeford Razor Total critical care time: 35 minutes Critical care time was exclusive of separately billable procedures and treating other patients. Critical care was necessary to treat or prevent imminent or life-threatening deterioration. Critical care was time spent personally by me on the following activities: development of treatment plan with patient and/or surrogate as well as nursing, discussions with consultants, evaluation of patient's response to treatment, examination of patient, obtaining history from patient or surrogate, ordering and performing treatments and interventions, ordering and review of laboratory studies, ordering and review of radiographic studies, pulse oximetry and re-evaluation of patient's  condition.   Medications Ordered in ED Medications  heparin ADULT infusion 100 units/mL (25000 units/252mL sodium chloride 0.45%) (1,500 Units/hr Intravenous New Bag/Given 02/28/19 1352)  morphine 4 MG/ML injection 6 mg (6 mg Intravenous Given 02/28/19 1204)  aspirin chewable tablet 324 mg (324 mg Oral Given 02/28/19 1345)  HYDROmorphone (DILAUDID) injection 1 mg (1 mg Intravenous Given 02/28/19 1346)  heparin bolus via infusion 4,000 Units (4,000 Units Intravenous Bolus from Bag 02/28/19 1355)    ED Course  I have reviewed the triage vital signs and the nursing notes.  Pertinent labs & imaging results that were available during my care of the patient were reviewed by me and considered in my medical decision making (see chart for details).    MDM Rules/Calculators/A&P                      49 year old male with chest pain.  Concern for possible unstable angina although some features are not typical..  Consider radiculopathywith pain going down his arm/hand and pins and needles sensation. I wouldn't expect him to also have substernal CP with this as well though. He also reports that symptoms are generally worse with exertion and associated with dyspnea.    History of cerebral venous thrombosis and noncompliant with his Xarelto.  His neuro exam is nonfocal and CT head does not show any acute abnormality today.  He says he is compliant with the rest of his medications although I have some doubts as he generally does not seem very knowledgeable of what he is taking.  Some concern that symptoms are cardiac in nature.  His EKG is without overt ischemic changes and troponins are normal x2 at this point.  I  do feel that he is low risk for  Final Clinical Impression(s) / ED Diagnoses Final diagnoses:  Chest pain, unspecified type    Rx / DC Orders ED Discharge Orders    None       Raeford Razor, MD 03/02/19 1129

## 2019-02-28 NOTE — Progress Notes (Signed)
ANTICOAGULATION CONSULT NOTE - Follow-Up Consult  Pharmacy Consult for heparin Indication: chest pain/ACS  Allergies  Allergen Reactions  . Formaldehyde     Patient Measurements: Height: 6\' 1"  (185.4 cm) Weight: 288 lb (130.6 kg) IBW/kg (Calculated) : 79.9 Heparin Dosing Weight: 109  Vital Signs: Temp: 98.1 F (36.7 C) (02/17 2029) Temp Source: Oral (02/17 2029) BP: 115/58 (02/17 2029) Pulse Rate: 61 (02/17 2029)  Labs: Recent Labs    02/28/19 0948 02/28/19 1206 02/28/19 1505 02/28/19 2010  HGB 14.7  --   --   --   HCT 45.2  --   --   --   PLT 184  --   --   --   APTT  --   --  34  --   LABPROT  --   --  13.8  --   INR  --   --  1.1  --   HEPARINUNFRC  --   --   --  0.10*  CREATININE 1.04  --   --   --   TROPONINIHS 4 5  --   --     Estimated Creatinine Clearance: 124.4 mL/min (by C-G formula based on SCr of 1.04 mg/dL).   Medical History: Past Medical History:  Diagnosis Date  . Acute deep vein thrombosis (DVT) of femoral vein (HCC)    dVT  . Arthritis    "knees" (11/23/2016)  . Clotting disorder (HCC)   . Cocaine abuse in remission (HCC) 2014  . Coronary artery disease due to lipid rich plaque   . DVT (deep venous thrombosis) (HCC) 05/2016   LLE  . 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 Hyperlipidemia   . Intracranial and intraspinal phlebitis and thrombophlebitis 05/31/2014  . Myocardial infarction (HCC) 2016   "caused by blood clot in the left side of my head"  . Stroke (HCC) 05/2014   denies residual on 11/23/2016  . Type II diabetes mellitus (HCC)     Assessment: CP consistent with ACS, hx of cardiac stent and DVT (previously on Xarelto). Initial heparin level subtherapeutic at 0.1, no infusion issues or S/Sx bleeding per RN.  Goal of Therapy:  Heparin level 0.3-0.7 units/ml Monitor platelets by anticoagulation protocol: Yes   Plan:  -Heparin 2000 units x1 -Increase infusion to  1800 units/h -Recheck heparin level with am labs   11/25/2016, PharmD, BCPS Clinical Pharmacist [phone] Please check AMION for all [facility] numbers 02/28/2019

## 2019-03-01 ENCOUNTER — Encounter (HOSPITAL_COMMUNITY)
Admission: EM | Disposition: A | Discharge status: Home / Self Care | Payer: Self-pay | Source: Home / Self Care | Attending: Cardiovascular Disease

## 2019-03-01 ENCOUNTER — Observation Stay (HOSPITAL_BASED_OUTPATIENT_CLINIC_OR_DEPARTMENT_OTHER): Payer: Self-pay

## 2019-03-01 DIAGNOSIS — R55 Syncope and collapse: Secondary | ICD-10-CM

## 2019-03-01 DIAGNOSIS — I959 Hypotension, unspecified: Secondary | ICD-10-CM

## 2019-03-01 DIAGNOSIS — R7989 Other specified abnormal findings of blood chemistry: Secondary | ICD-10-CM

## 2019-03-01 DIAGNOSIS — E782 Mixed hyperlipidemia: Secondary | ICD-10-CM

## 2019-03-01 DIAGNOSIS — I25110 Atherosclerotic heart disease of native coronary artery with unstable angina pectoris: Principal | ICD-10-CM

## 2019-03-01 DIAGNOSIS — I249 Acute ischemic heart disease, unspecified: Secondary | ICD-10-CM

## 2019-03-01 DIAGNOSIS — D6859 Other primary thrombophilia: Secondary | ICD-10-CM

## 2019-03-01 HISTORY — PX: LEFT HEART CATH AND CORONARY ANGIOGRAPHY: CATH118249

## 2019-03-01 LAB — TROPONIN I (HIGH SENSITIVITY)
Troponin I (High Sensitivity): 3 ng/L (ref <18)
Troponin I (High Sensitivity): 3 ng/L (ref <18)

## 2019-03-01 LAB — BASIC METABOLIC PANEL
Anion gap: 8 (ref 5–15)
BUN: 14 mg/dL (ref 6–20)
CO2: 24 mmol/L (ref 22–32)
Calcium: 8.9 mg/dL (ref 8.9–10.3)
Chloride: 103 mmol/L (ref 98–111)
Creatinine, Ser: 0.88 mg/dL (ref 0.61–1.24)
GFR calc Af Amer: >60 mL/min (ref >60)
GFR calc non Af Amer: >60 mL/min (ref >60)
Glucose, Bld: 121 mg/dL — ABNORMAL HIGH (ref 70–99)
Potassium: 3.6 mmol/L (ref 3.5–5.1)
Sodium: 135 mmol/L (ref 135–145)

## 2019-03-01 LAB — CBC
HCT: 43 % (ref 39.0–52.0)
Hemoglobin: 14 g/dL (ref 13.0–17.0)
MCH: 28.7 pg (ref 26.0–34.0)
MCHC: 32.6 g/dL (ref 30.0–36.0)
MCV: 88.3 fL (ref 80.0–100.0)
Platelets: 186 10*3/uL (ref 150–400)
RBC: 4.87 MIL/uL (ref 4.22–5.81)
RDW: 14.5 % (ref 11.5–15.5)
WBC: 6.1 10*3/uL (ref 4.0–10.5)
nRBC: 0 % (ref 0.0–0.2)

## 2019-03-01 LAB — PROTIME-INR
INR: 1.1 (ref 0.8–1.2)
Prothrombin Time: 14.1 seconds (ref 11.4–15.2)

## 2019-03-01 LAB — GLUCOSE, CAPILLARY
Glucose-Capillary: 73 mg/dL (ref 70–99)
Glucose-Capillary: 76 mg/dL (ref 70–99)
Glucose-Capillary: 97 mg/dL (ref 70–99)
Glucose-Capillary: 86 mg/dL (ref 70–99)

## 2019-03-01 LAB — LIPID PANEL
Cholesterol: 184 mg/dL (ref 0–200)
HDL: 30 mg/dL — ABNORMAL LOW (ref >40)
LDL Cholesterol: 136 mg/dL — ABNORMAL HIGH (ref 0–99)
Total CHOL/HDL Ratio: 6.1 RATIO
Triglycerides: 90 mg/dL (ref <150)
VLDL: 18 mg/dL (ref 0–40)

## 2019-03-01 LAB — ECHOCARDIOGRAM COMPLETE
Height: 73 in
Weight: 4666.7 oz

## 2019-03-01 LAB — TSH: TSH: 4.612 u[IU]/mL — ABNORMAL HIGH (ref 0.350–4.500)

## 2019-03-01 LAB — HEPARIN LEVEL (UNFRACTIONATED)
Heparin Unfractionated: 0.13 IU/mL — ABNORMAL LOW (ref 0.30–0.70)
Heparin Unfractionated: 0.2 IU/mL — ABNORMAL LOW (ref 0.30–0.70)

## 2019-03-01 LAB — T4, FREE: Free T4: 0.9 ng/dL (ref 0.61–1.12)

## 2019-03-01 LAB — HEMOGLOBIN A1C
Hgb A1c MFr Bld: 5.6 % (ref 4.8–5.6)
Mean Plasma Glucose: 114.02 mg/dL

## 2019-03-01 LAB — HIV ANTIBODY (ROUTINE TESTING W REFLEX): HIV Screen 4th Generation wRfx: NONREACTIVE

## 2019-03-01 SURGERY — LEFT HEART CATH AND CORONARY ANGIOGRAPHY
Anesthesia: LOCAL

## 2019-03-01 MED ORDER — ATORVASTATIN CALCIUM 80 MG PO TABS: 80.0000 mg | ORAL_TABLET | Freq: Every day | ORAL | 5 refills | Status: DC

## 2019-03-01 MED ORDER — ENOXAPARIN SODIUM 40 MG/0.4ML ~~LOC~~ SOLN: 40.0000 mg | SUBCUTANEOUS | Status: DC

## 2019-03-01 MED ORDER — LIDOCAINE HCL (PF) 1 % IJ SOLN
INTRAMUSCULAR | Status: DC | PRN
  Administered 2019-03-01: 2 mL

## 2019-03-01 MED ORDER — SODIUM CHLORIDE 0.9% FLUSH: 3.0000 mL | INTRAVENOUS | Status: DC | PRN

## 2019-03-01 MED ORDER — SODIUM CHLORIDE 0.9 % WEIGHT BASED INFUSION: 1.0000 mL/kg/h | INTRAVENOUS | Status: DC

## 2019-03-01 MED ORDER — VERAPAMIL HCL 2.5 MG/ML IV SOLN
INTRAVENOUS | Status: AC
  Filled 2019-03-01: qty 2

## 2019-03-01 MED ORDER — SODIUM CHLORIDE 0.9% FLUSH
3.0000 mL | Freq: Two times a day (BID) | INTRAVENOUS | Status: DC
  Administered 2019-03-01: 3 mL via INTRAVENOUS

## 2019-03-01 MED ORDER — MIDAZOLAM HCL 2 MG/2ML IJ SOLN
INTRAMUSCULAR | Status: AC
  Filled 2019-03-01: qty 2

## 2019-03-01 MED ORDER — SODIUM CHLORIDE 0.9 % IV SOLN: 250.0000 mL | INTRAVENOUS | Status: DC | PRN

## 2019-03-01 MED ORDER — HEPARIN SODIUM (PORCINE) 1000 UNIT/ML IJ SOLN
INTRAMUSCULAR | Status: AC
  Filled 2019-03-01: qty 1

## 2019-03-01 MED ORDER — APIXABAN 5 MG PO TABS: 5.0000 mg | ORAL_TABLET | Freq: Two times a day (BID) | ORAL | 2 refills | Status: DC

## 2019-03-01 MED ORDER — FENTANYL CITRATE (PF) 100 MCG/2ML IJ SOLN
INTRAMUSCULAR | Status: AC
  Filled 2019-03-01: qty 2

## 2019-03-01 MED ORDER — HEPARIN (PORCINE) IN NACL 1000-0.9 UT/500ML-% IV SOLN
INTRAVENOUS | Status: AC
  Filled 2019-03-01: qty 1000

## 2019-03-01 MED ORDER — HEPARIN SODIUM (PORCINE) 1000 UNIT/ML IJ SOLN
INTRAMUSCULAR | Status: DC | PRN
  Administered 2019-03-01: 6000 [IU] via INTRAVENOUS

## 2019-03-01 MED ORDER — CLOPIDOGREL BISULFATE 75 MG PO TABS: 75.0000 mg | ORAL_TABLET | Freq: Every day | ORAL | 5 refills | Status: DC

## 2019-03-01 MED ORDER — VERAPAMIL HCL 2.5 MG/ML IV SOLN
INTRAVENOUS | Status: DC | PRN
  Administered 2019-03-01: 10 mL via INTRA_ARTERIAL

## 2019-03-01 MED ORDER — HEPARIN (PORCINE) IN NACL 1000-0.9 UT/500ML-% IV SOLN
INTRAVENOUS | Status: DC | PRN
  Administered 2019-03-01 (×2): 500 mL

## 2019-03-01 MED ORDER — IOHEXOL 350 MG/ML SOLN
INTRAVENOUS | Status: DC | PRN
  Administered 2019-03-01: 95 mL via INTRA_ARTERIAL

## 2019-03-01 MED ORDER — NITROGLYCERIN 0.4 MG SL SUBL: 0.4000 mg | SUBLINGUAL_TABLET | SUBLINGUAL | 1 refills | Status: DC | PRN

## 2019-03-01 MED ORDER — MIDAZOLAM HCL 2 MG/2ML IJ SOLN
INTRAMUSCULAR | Status: DC | PRN
  Administered 2019-03-01: 1 mg via INTRAVENOUS

## 2019-03-01 MED ORDER — LIDOCAINE HCL (PF) 1 % IJ SOLN
INTRAMUSCULAR | Status: AC
  Filled 2019-03-01: qty 30

## 2019-03-01 MED ORDER — SODIUM CHLORIDE 0.9 % WEIGHT BASED INFUSION: 3.0000 mL/kg/h | INTRAVENOUS | Status: DC

## 2019-03-01 MED ORDER — SODIUM CHLORIDE 0.9 % IV BOLUS
500.0000 mL | Freq: Once | INTRAVENOUS | Status: AC
  Administered 2019-03-01: 500 mL via INTRAVENOUS

## 2019-03-01 MED ORDER — FENTANYL CITRATE (PF) 100 MCG/2ML IJ SOLN
INTRAMUSCULAR | Status: DC | PRN
  Administered 2019-03-01: 25 ug via INTRAVENOUS

## 2019-03-01 MED ORDER — SODIUM CHLORIDE 0.9 % WEIGHT BASED INFUSION
3.0000 mL/kg/h | INTRAVENOUS | Status: DC
  Administered 2019-03-01: 3 mL/kg/h via INTRAVENOUS

## 2019-03-01 MED FILL — NITROGLYCERIN 0.4 MG TAB SL: 0.4 | 8 days supply | Qty: 25 | Fill #0

## 2019-03-01 MED FILL — ATORVASTATIN CALCIUM 80 MG: 80 | 30 days supply | Qty: 30 | Fill #0

## 2019-03-01 MED FILL — CLOPIDOGREL 75 MG TABLET: 75 | 30 days supply | Qty: 30 | Fill #0

## 2019-03-01 MED FILL — ELIQUIS 5 MG TABLET: 5 | 30 days supply | Qty: 60 | Fill #0

## 2019-03-01 SURGICAL SUPPLY — 10 items
CATH 5FR JL3.5 JR4 ANG PIG MP (CATHETERS) ×1 IMPLANT
DEVICE RAD COMP TR BAND LRG (VASCULAR PRODUCTS) ×1 IMPLANT
GLIDESHEATH SLEND SS 6F .021 (SHEATH) ×2 IMPLANT
GUIDEWIRE INQWIRE 1.5J.035X260 (WIRE) IMPLANT
INQWIRE 1.5J .035X260CM (WIRE) ×2
KIT HEART LEFT (KITS) ×2 IMPLANT
PACK CARDIAC CATHETERIZATION (CUSTOM PROCEDURE TRAY) ×2 IMPLANT
SYR MEDRAD MARK 7 150ML (SYRINGE) ×2 IMPLANT
TRANSDUCER W/STOPCOCK (MISCELLANEOUS) ×2 IMPLANT
TUBING CIL FLEX 10 FLL-RA (TUBING) ×2 IMPLANT

## 2019-03-01 NOTE — Care Management (Signed)
2230 03-01-19 Patient presented for Unstable Angina. Patient post cath- plan for home on Eliquis- was previously on Xarelto and unable to afford. Patient has used the free 30 day card for Xarelto. Patient will receive medications via the Transition of Care [facility]) Pharmacy prior to transition home. Patient is aware to call the Cardiology office or go online for patient assistance application via the company for Eliquis. However, patient may not be eligible due to household income. MATCH completed for medications today. Case Manager did attempt to call the [facility] today and the office was closed due to inclement weather. Case Manager will call tomorrow for an appointment and call the patient at home. No further needs from Case Manager at this time. Gala Lewandowsky, RN,BSN Case Manager [phone]

## 2019-03-01 NOTE — Progress Notes (Signed)
Discharge review done with patient. He acknowledged understanding of information provided. Pt is stable, waiting to be picked up by family member.

## 2019-03-01 NOTE — Progress Notes (Addendum)
Patient received from cath lab. VS assessed,  RT band on L radial with 7cc of air in band. Heparin gtt was discontinue prior to patient arriving to 6E. Will continue to monitor

## 2019-03-01 NOTE — Progress Notes (Signed)
  Echocardiogram 2D Echocardiogram has been performed.  [name] Holland [name] 03/01/2019, 10:22 AM

## 2019-03-01 NOTE — Progress Notes (Signed)
RN updated on plan of care. RT band hand off.

## 2019-03-01 NOTE — Progress Notes (Addendum)
Progress Note  Patient Name: [name] Holland Date of Encounter: 03/01/2019  Primary Cardiologist: Sinclair Grooms, MD   Subjective   Patient admitted late last night chest pain.  He notes no pain across his chest that radiates it first to his right than his left yesterday with associated shortness of breath.  Pain worse with exertion.  Of note, he also notes a syncopal episode on Sunday night after having sex with his girlfriend.  He states he got up and walked downstairs and then back upstairs and felt lightheaded/dizzy and passed out for a few seconds.  No palpitations chest pain prior to syncopal episode.  Did not hit his head when he fell.  He woke up the next morning with a peer headache and blurry vision of his right eye.  He states patient worsened throughout the day and then resolved.  No neuro symptoms.  No recurrent episodes of syncope.  This morning, patient's only complaint is pressure in the back of his head that he ranks as a 4 out of 10 on the pain scale.  No chest pain or shortness of breath at this time.  Inpatient Medications    Scheduled Meds: . aspirin EC  81 mg Oral Daily  . atorvastatin  80 mg Oral q1800  . clopidogrel  75 mg Oral Daily  . insulin aspart  0-15 Units Subcutaneous TID WC  . insulin aspart  0-5 Units Subcutaneous QHS  . isosorbide mononitrate  15 mg Oral Daily   Continuous Infusions: . heparin 2,000 Units/hr (03/01/19 3664)   PRN Meds: acetaminophen, HYDROcodone-acetaminophen, morphine injection, nitroGLYCERIN, ondansetron (ZOFRAN) IV   Vital Signs    Vitals:   02/28/19 1730 02/28/19 1800 02/28/19 2029 03/01/19 0500  BP: 124/72 113/62 (!) 115/58 (!) 90/47  Pulse: 71 77 61 (!) 50  Resp: 15 (!) 27 20 18   Temp:   98.1 F (36.7 C) 98.2 F (36.8 C)  TempSrc:   Oral Oral  SpO2: 100% 96% 98% 96%  Weight:   132.3 kg 132.3 kg  Height:   6\' 1"  (1.854 m)    No intake or output data in the 24 hours ending 03/01/19 0703 Last 3 Weights  03/01/2019 02/28/2019 02/28/2019  Weight (lbs) 291 lb 10.7 oz 291 lb 11.2 oz 288 lb  Weight (kg) 132.3 kg 132.314 kg 130.636 kg      Telemetry    Normal sinus rhythm with rate mostly in the 50's to 70's but occasionally in the 90's. - Personally Reviewed  ECG    No new EKG today. - Personally Reviewed  Physical Exam   GEN: Obese Caucasian male resting comfortably in no acute distress.   Neck: Supple. Cardiac: RRR. No murmurs, rubs, or gallops. Radial pulses 2+ and equal bilaterally. Respiratory: Clear to auscultation bilaterally. No wheezes, rhonchi, or rales. GI: Soft, non-distended, and non-tender. Bowel sounds present.  MS: No edema. No deformity. Skin: Warm and dry. Neuro:  No focal deficits. No facial asymmetry. Gross upper/lower extremity strength equal and normal. Psych: Normal affect.  Labs    High Sensitivity Troponin:   Recent Labs  Lab 02/28/19 0948 02/28/19 1206 03/01/19 0004 03/01/19 0226  TROPONINIHS 4 5 3 3       Chemistry Recent Labs  Lab 02/28/19 0948 03/01/19 0004  NA 135 135  K 3.6 3.6  CL 103 103  CO2 23 24  GLUCOSE 96 121*  BUN 20 14  CREATININE 1.04 0.88  CALCIUM 9.4 8.9  GFRNONAA >60 >60  GFRAA >60 >60  ANIONGAP 9 8     Hematology Recent Labs  Lab 02/28/19 0948 03/01/19 0004  WBC 8.4 6.1  RBC 5.12 4.87  HGB 14.7 14.0  HCT 45.2 43.0  MCV 88.3 88.3  MCH 28.7 28.7  MCHC 32.5 32.6  RDW 14.2 14.5  PLT 184 186    BNPNo results for input(s): BNP, PROBNP in the last 168 hours.   DDimer No results for input(s): DDIMER in the last 168 hours.   Radiology    DG Chest 2 View  Result Date: 02/28/2019 CLINICAL DATA:  Chest pain and shortness of breath EXAM: CHEST - 2 VIEW COMPARISON:  08/15/2018 FINDINGS: Cardiac shadow is within normal limits. The lungs are well aerated bilaterally. No focal infiltrate or sizable effusion is seen. No bony abnormality is noted. IMPRESSION: No active cardiopulmonary disease. Electronically Signed   By:  Alcide Clever M.D.   On: 02/28/2019 10:24   CT Head Wo Contrast  Result Date: 02/28/2019 CLINICAL DATA:  Right-sided tingling. History of cerebral venous thrombosis. Numbness or tingling, paresthesia. Additional history provided: Patient reports right chest pain radiating to right arm, weak, dizzy, lightheaded. EXAM: CT HEAD WITHOUT CONTRAST TECHNIQUE: Contiguous axial images were obtained from the base of the skull through the vertex without intravenous contrast. COMPARISON:  Brain MRI 11/16/2018, CT venogram 11/16/2018, CT angiogram head/neck 11/16/2018, noncontrast head CT 11/16/2018 FINDINGS: Brain: No evidence of acute intracranial hemorrhage. No demarcated cortical infarction. No evidence of intracranial mass. No midline shift or extra-axial fluid collection. Cerebral volume is normal for age. Vascular: No hyperdense vessel. Skull: Normal. Negative for fracture or focal lesion. Sinuses/Orbits: Visualized orbits demonstrate no acute abnormality. No significant paranasal sinus disease or mastoid effusion at the imaged levels. IMPRESSION: Unremarkable noncontrast CT appearance of the brain. No evidence of acute intracranial abnormality. Electronically Signed   By: Jackey Loge DO   On: 02/28/2019 10:43    Cardiac Studies   Echocardiogram 07/18/2017: Study Conclusions: - Left ventricle: The cavity size was normal. Wall thickness was  normal. Systolic function was normal. The estimated ejection  fraction was in the range of 55% to 60%. Wall motion was normal;  there were no regional wall motion abnormalities. Doppler  parameters are consistent with abnormal left ventricular  relaxation (grade 1 diastolic dysfunction).  - Mitral valve: Calcified annulus.   Impressions:  - Normal LV systolic function; mild diastolic dysfunction.  _______________  Left Heart Catheterization 07/18/2017: Conclusions: 1. Mild to moderate proximal LAD stenosis just proximal to previously placed stent.   Proximal LAD stent is widely patent. 2. Normal left ventricular contraction. 3. Mildly elevated left ventricular filling pressure.  Recommendations: 1. Aggressive secondary prevention. 2. Restart heparin infusion 8 hours after hemostasis following sheath removal. 3. If no evidence of bleeding or vascular complication, restart rivaroxaban tomorrow. 4. Load with clopidogrel 300 mg x 1 tonight followed by 75 mg daily thereafter. 5. Start isosorbide mononitrate 15 mg daily for possible component of vasospasm.  Recommend to resume Rivaroxaban, at currently prescribed dose and frequency, on 07/19/17.  Recommend concurrent antiplatelet therapy of Clopidogrel 75mg  daily for 1 year from time of proximal LAD PCI in 11/2016.  Patient Profile   Mr. Breighner is a 49 y.o. 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  Assessment & Plan    Chest Pain with Known CAD - Patient presented with chest pain. He has known CAD with NSTEMI in 11/2016 due to thrombus formation at site of likely ruptured plaque in proximal LAD s/p DES. Most recent cath in 07/2017 showed patent LAD stent with mild to moderate proximal LAD stenosis just proximal to stent. - EKG showed no acute ST/T changes. - High-sensitivity troponin negative x4. - Echo pending.  - TSH slightly elevated at 4.612. Will check free T4 and free T3. - Still taking Plavix 75mg daily even though.looks like he was told he could stop this in 11/2017. - Will discuss further evaluation with Lexiscan Myoview vs. cath with MD given possible exertional syncope. Patient already NPO.   Syncope - Patient had brief syncopal episodes on evening of 2/14 after sex with girlfriend. He states he got up and walked  downstairs and then back upstairs and felt lightheaded/dizzy and passed out for a few seconds.  No palpitations chest pain prior to syncopal episode. - CT head in ED showed no acute findings. - Echo pending. - No arrhythmias noted so far on telemetry. - BP has been soft at times so will check orthostatic vitals signs.  Headache - Patient reports posterior head pressure this morning. He also reports waking up with severe headache on 2/15 with associated blurry vision in right eye. Vision has since improved. He also reported some "pins and needs sensation" his his right arm when he was having chest pain prior to presentation. No other neuro symptoms at this time. - Head CT in the ED unremarkable.  - Patient reportedly had a TIA in 10/2018 and does have a history of cerebral venous thrombosis. He is supposed to be on lifelong anticoagulation with Xarelto but has not been taking it for the past month.  - Will discuss whether any additional imaging is needed at this time. - OK to use Tylenol as needed for pain. Would not recommend using home Vicodin for headaches due to possible rebound headaches.   Hyperlipidemia - LDL 287 on lipid panel in 02/2017. - Will recheck lipid panel. - Continue Lipitor 80mg daily.   Type 2 Diabetes Mellitus - Hemoglobin A1c 5.6 this admission. Not on any medications at home.  - Continue sliding scale insulin and CBGs while admitted.    Hypercoagulable Disorder - Patient has a history of a clotting disorder. He has had a prior stroke, cerebral vein thrombosis, and DVT. He is supposed to be on lifelong Xarelto but reportedly has not been taken this due to financial issues.  - Currently on IV Heparin.  - Will consult Case Management for assistance with Xarelto.  For questions or updates, please contact [facility] Please consult [email or web address] for contact info under        Signed, Callie E Goodrich, PA-C  03/01/2019, 7:03 AM    I have examined the patient and  reviewed assessment and plan and discussed with patient.  Agree with above as stated.    Patient still having mild chest pain.  WIll plan for cath.  All questions answered.  Last cath was done from the groin.  He has a moderate LAD lesion which is eccentric.  I would consider FFR of this lesion if it is not obviously stenotic.  All questions answered about cath.  I personally reviewed echo images.  LVEF appears low normal.  Aortic valve opens well.   Will need anticoagulation for hypercoaguable state.  Kimbery Harwood  

## 2019-03-01 NOTE — Plan of Care (Signed)
  Problem: Education: Goal: Knowledge of General Education information will improve Description: Including pain rating scale, medication(s)/side effects and non-pharmacologic comfort measures Outcome: Progressing   Problem: Clinical Measurements: Goal: Will remain free from infection Outcome: Progressing   

## 2019-03-01 NOTE — Progress Notes (Signed)
ANTICOAGULATION CONSULT NOTE - Follow-Up Consult  Pharmacy Consult for heparin Indication: chest pain/ACS  Assessment: CP consistent with ACS, hx of cardiac stent and DVT (previously on Xarelto).  Heparin level this am 0.20 units/hr.  Some bleeding noted overnight from IV site, now resolved. CBC stable  Goal of Therapy:  Heparin level 0.3-0.7 units/ml Monitor platelets by anticoagulation protocol: Yes   Plan:  -Increase infusion to 2000 units/h -Recheck heparin level in ~hours  Thanks for allowing pharmacy to be a part of this patient's care.  Talbert Cage, PharmD Clinical Pharmacist 03/01/2019

## 2019-03-01 NOTE — Progress Notes (Addendum)
Marjie Skiff PA with cardiology made aware of soft blood pressures post catheterization. Patient is asymptomatic. Order for of NS bolus received.

## 2019-03-01 NOTE — H&P (View-Only) (Signed)
Progress Note  Patient Name: [name] Holland Date of Encounter: 03/01/2019  Primary Cardiologist: Sinclair Grooms, MD   Subjective   Patient admitted late last night chest pain.  He notes no pain across his chest that radiates it first to his right than his left yesterday with associated shortness of breath.  Pain worse with exertion.  Of note, he also notes a syncopal episode on Sunday night after having sex with his girlfriend.  He states he got up and walked downstairs and then back upstairs and felt lightheaded/dizzy and passed out for a few seconds.  No palpitations chest pain prior to syncopal episode.  Did not hit his head when he fell.  He woke up the next morning with a peer headache and blurry vision of his right eye.  He states patient worsened throughout the day and then resolved.  No neuro symptoms.  No recurrent episodes of syncope.  This morning, patient's only complaint is pressure in the back of his head that he ranks as a 4 out of 10 on the pain scale.  No chest pain or shortness of breath at this time.  Inpatient Medications    Scheduled Meds: . aspirin EC  81 mg Oral Daily  . atorvastatin  80 mg Oral q1800  . clopidogrel  75 mg Oral Daily  . insulin aspart  0-15 Units Subcutaneous TID WC  . insulin aspart  0-5 Units Subcutaneous QHS  . isosorbide mononitrate  15 mg Oral Daily   Continuous Infusions: . heparin 2,000 Units/hr (03/01/19 3664)   PRN Meds: acetaminophen, HYDROcodone-acetaminophen, morphine injection, nitroGLYCERIN, ondansetron (ZOFRAN) IV   Vital Signs    Vitals:   02/28/19 1730 02/28/19 1800 02/28/19 2029 03/01/19 0500  BP: 124/72 113/62 (!) 115/58 (!) 90/47  Pulse: 71 77 61 (!) 50  Resp: 15 (!) 27 20 18   Temp:   98.1 F (36.7 C) 98.2 F (36.8 C)  TempSrc:   Oral Oral  SpO2: 100% 96% 98% 96%  Weight:   132.3 kg 132.3 kg  Height:   6\' 1"  (1.854 m)    No intake or output data in the 24 hours ending 03/01/19 0703 Last 3 Weights  03/01/2019 02/28/2019 02/28/2019  Weight (lbs) 291 lb 10.7 oz 291 lb 11.2 oz 288 lb  Weight (kg) 132.3 kg 132.314 kg 130.636 kg      Telemetry    Normal sinus rhythm with rate mostly in the 50's to 70's but occasionally in the 90's. - Personally Reviewed  ECG    No new EKG today. - Personally Reviewed  Physical Exam   GEN: Obese Caucasian male resting comfortably in no acute distress.   Neck: Supple. Cardiac: RRR. No murmurs, rubs, or gallops. Radial pulses 2+ and equal bilaterally. Respiratory: Clear to auscultation bilaterally. No wheezes, rhonchi, or rales. GI: Soft, non-distended, and non-tender. Bowel sounds present.  MS: No edema. No deformity. Skin: Warm and dry. Neuro:  No focal deficits. No facial asymmetry. Gross upper/lower extremity strength equal and normal. Psych: Normal affect.  Labs    High Sensitivity Troponin:   Recent Labs  Lab 02/28/19 0948 02/28/19 1206 03/01/19 0004 03/01/19 0226  TROPONINIHS 4 5 3 3       Chemistry Recent Labs  Lab 02/28/19 0948 03/01/19 0004  NA 135 135  K 3.6 3.6  CL 103 103  CO2 23 24  GLUCOSE 96 121*  BUN 20 14  CREATININE 1.04 0.88  CALCIUM 9.4 8.9  GFRNONAA >60 >60  GFRAA >60 >60  ANIONGAP 9 8     Hematology Recent Labs  Lab 02/28/19 0948 03/01/19 0004  WBC 8.4 6.1  RBC 5.12 4.87  HGB 14.7 14.0  HCT 45.2 43.0  MCV 88.3 88.3  MCH 28.7 28.7  MCHC 32.5 32.6  RDW 14.2 14.5  PLT 184 186    BNPNo results for input(s): BNP, PROBNP in the last 168 hours.   DDimer No results for input(s): DDIMER in the last 168 hours.   Radiology    DG Chest 2 View  Result Date: 02/28/2019 CLINICAL DATA:  Chest pain and shortness of breath EXAM: CHEST - 2 VIEW COMPARISON:  08/15/2018 FINDINGS: Cardiac shadow is within normal limits. The lungs are well aerated bilaterally. No focal infiltrate or sizable effusion is seen. No bony abnormality is noted. IMPRESSION: No active cardiopulmonary disease. Electronically Signed   By:  Alcide Clever M.D.   On: 02/28/2019 10:24   CT Head Wo Contrast  Result Date: 02/28/2019 CLINICAL DATA:  Right-sided tingling. History of cerebral venous thrombosis. Numbness or tingling, paresthesia. Additional history provided: Patient reports right chest pain radiating to right arm, weak, dizzy, lightheaded. EXAM: CT HEAD WITHOUT CONTRAST TECHNIQUE: Contiguous axial images were obtained from the base of the skull through the vertex without intravenous contrast. COMPARISON:  Brain MRI 11/16/2018, CT venogram 11/16/2018, CT angiogram head/neck 11/16/2018, noncontrast head CT 11/16/2018 FINDINGS: Brain: No evidence of acute intracranial hemorrhage. No demarcated cortical infarction. No evidence of intracranial mass. No midline shift or extra-axial fluid collection. Cerebral volume is normal for age. Vascular: No hyperdense vessel. Skull: Normal. Negative for fracture or focal lesion. Sinuses/Orbits: Visualized orbits demonstrate no acute abnormality. No significant paranasal sinus disease or mastoid effusion at the imaged levels. IMPRESSION: Unremarkable noncontrast CT appearance of the brain. No evidence of acute intracranial abnormality. Electronically Signed   By: Jackey Loge DO   On: 02/28/2019 10:43    Cardiac Studies   Echocardiogram 07/18/2017: Study Conclusions: - Left ventricle: The cavity size was normal. Wall thickness was  normal. Systolic function was normal. The estimated ejection  fraction was in the range of 55% to 60%. Wall motion was normal;  there were no regional wall motion abnormalities. Doppler  parameters are consistent with abnormal left ventricular  relaxation (grade 1 diastolic dysfunction).  - Mitral valve: Calcified annulus.   Impressions:  - Normal LV systolic function; mild diastolic dysfunction.  _______________  Left Heart Catheterization 07/18/2017: Conclusions: 1. Mild to moderate proximal LAD stenosis just proximal to previously placed stent.   Proximal LAD stent is widely patent. 2. Normal left ventricular contraction. 3. Mildly elevated left ventricular filling pressure.  Recommendations: 1. Aggressive secondary prevention. 2. Restart heparin infusion 8 hours after hemostasis following sheath removal. 3. If no evidence of bleeding or vascular complication, restart rivaroxaban tomorrow. 4. Load with clopidogrel 300 mg x 1 tonight followed by 75 mg daily thereafter. 5. Start isosorbide mononitrate 15 mg daily for possible component of vasospasm.  Recommend to resume Rivaroxaban, at currently prescribed dose and frequency, on 07/19/17.  Recommend concurrent antiplatelet therapy of Clopidogrel 75mg  daily for 1 year from time of proximal LAD PCI in 11/2016.  Patient Profile   Mr. Breighner is a 49 y.o. 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  Assessment & Plan    Chest Pain with Known CAD - Patient presented with chest pain. He has known CAD with NSTEMI in 11/2016 due to thrombus formation at site of likely ruptured plaque in proximal LAD s/p DES. Most recent cath in 07/2017 showed patent LAD stent with mild to moderate proximal LAD stenosis just proximal to stent. - EKG showed no acute ST/T changes. - High-sensitivity troponin negative x4. - Echo pending.  - TSH slightly elevated at 4.612. Will check free T4 and free T3. - Still taking Plavix 75mg  daily even though.looks like he was told he could stop this in 11/2017. - Will discuss further evaluation with Lexiscan Myoview vs. cath with MD given possible exertional syncope. Patient already NPO.   Syncope - Patient had brief syncopal episodes on evening of 2/14 after sex with girlfriend. He states he got up and walked  downstairs and then back upstairs and felt lightheaded/dizzy and passed out for a few seconds.  No palpitations chest pain prior to syncopal episode. - CT head in ED showed no acute findings. - Echo pending. - No arrhythmias noted so far on telemetry. - BP has been soft at times so will check orthostatic vitals signs.  Headache - Patient reports posterior head pressure this morning. He also reports waking up with severe headache on 2/15 with associated blurry vision in right eye. Vision has since improved. He also reported some "pins and needs sensation" his his right arm when he was having chest pain prior to presentation. No other neuro symptoms at this time. - Head CT in the ED unremarkable.  - Patient reportedly had a TIA in 10/2018 and does have a history of cerebral venous thrombosis. He is supposed to be on lifelong anticoagulation with Xarelto but has not been taking it for the past month.  - Will discuss whether any additional imaging is needed at this time. - OK to use Tylenol as needed for pain. Would not recommend using home Vicodin for headaches due to possible rebound headaches.   Hyperlipidemia - LDL 287 on lipid panel in 11/2018. - Will recheck lipid panel. - Continue Lipitor 80mg  daily.   Type 2 Diabetes Mellitus - Hemoglobin A1c 5.6 this admission. Not on any medications at home.  - Continue sliding scale insulin and CBGs while admitted.    Hypercoagulable Disorder - Patient has a history of a clotting disorder. He has had a prior stroke, cerebral vein thrombosis, and DVT. He is supposed to be on lifelong Xarelto but reportedly has not been taken this due to financial issues.  - Currently on IV Heparin.  - Will consult Case Management for assistance with Xarelto.  For questions or updates, please contact [facility] Please consult [email or web address] for contact info under        Signed, [id number], PA-C  03/01/2019, 7:03 AM    I have examined the patient and  reviewed assessment and plan and discussed with patient.  Agree with above as stated.    Patient still having mild chest pain.  WIll plan for cath.  All questions answered.  Last cath was done from the groin.  He has a moderate LAD lesion which is eccentric.  I would consider FFR of this lesion if it is not obviously stenotic.  All questions answered about cath.  I personally reviewed echo images.  LVEF appears low normal.  Aortic valve opens well.   Will need anticoagulation for hypercoaguable state.  Corrin Parker

## 2019-03-01 NOTE — Progress Notes (Signed)
Patient ambulated after TR band removed, no complications and no bleeding noted. On call Cards fellow Charna Busman made aware, order received to discharge patient.

## 2019-03-01 NOTE — Discharge Summary (Addendum)
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 No evidence of acute intracranial abnormality. _______________  Left Heart Catheterization 03/01/2019:  Prox LAD-1 lesion is 30% stenosed.  Non-stenotic Prox LAD-2 lesion was previously treated.  The left ventricular systolic function is normal.  LV end diastolic pressure is normal.  The left ventricular ejection fraction is 50-55% by visual estimate.   1. Nonobstructive CAD. The stent in the proximal LAD is widely patent. 2. Good LV function EF 55% 3. Low LVEDP  Plan: medical therapy. OK to resume anticoagulation tomorrow.  _____________  Echocardiogram 03/01/2019: Impressions: 1. Left ventricular ejection fraction, by estimation, is 50%. The left  ventricle has mildly decreased function. The left ventricle demonstrates  regional wall motion abnormalities (see scoring diagram/findings for  description). Mid inferolateral  hypokinesis (this does not match cath findings). Left ventricular  diastolic parameters were normal.  2. Right ventricular systolic function is normal. The right ventricular  size is normal. There is normal pulmonary artery systolic pressure. The    estimated right ventricular systolic pressure is 20.1 mmHg.  3. Left atrial size was mildly dilated.  4. Right atrial size was mildly dilated.  5. The mitral valve is normal in structure and function. No evidence of  mitral valve regurgitation. No evidence of mitral stenosis.  6. The aortic valve is tricuspid. Aortic valve regurgitation is not  visualized. No aortic stenosis is present.  7. Aortic dilatation noted. There is mild dilatation of the aortic root  measuring 37 mm.  8. The inferior vena cava is normal in size with greater than 50%  respiratory variability, suggesting right atrial pressure of 3 mmHg.    History of Present Illness     [name] Holland is a 49 y.o. male with a history of CAD s/p DES to proximal LAD in 11/2016, presumed hypercoagulable disorder with prior TIA, DVT, and cerebral venous thrombosis, hyperlipidemia, and type 2 diabetes mellitus who presented to the [facility] ED on 02/28/2019 for evaluation of chest pain.   Patient reported onset of "dully achy" pain across his chest on day of presentation. Onset occurred at rest but was worse with exertion. Pain radiated to his right shoulder and right arm and patient noted occasional "pins-and-needle" sensation in his right arm as well. He also reported associated shortness of breath. He did work out on day of presentation without any problems. Symptoms started after his work out. Pain was somewhat similar to angina he had before PCI so he decided to come to the ED for further evaluation.   Patient did have a syncopal episode several days prior to presentation after having sex with his girlfriend. Prodromal symptoms included lightheadedness and dizziness. No chest pain or palpitations prior to this event. Patient states he was unconscious for only a few seconds. No  recurrent syncope.  In the ED, EKG showed no acute ischemic changes. High-sensitivity troponin negative x2. Chest x-ray showed no acute findings. CBC and  BMET unremarkable. Head CT was performed in the ED given reports of head/neck pain and showed no acute findings. Patient was transferred to [facility] for further work-up of possible unstable angina.   Hospital Course     Consultants: None  Unstable Angina  Patient admitted for further work-up of chest pain concerning for unstable angina as reported above. High-sensitivity troponin remained negative x4. Given history of proximal LAD disease and possible exertional syncope, decision was made to proceed with cardiac catheterization. Cardiac Cath showed did not proximal LAD stent with 30% stenosis just proximal to the stent.  No other disease noted.  LVEDP was low.  Echo showed LVEF of 50% with mid inferolateral hypokinesis that does not match cath findings.  See full report above.  Patient tolerated procedure well but did become hypotensive with BP dropping to 76/34 following cath. Patient was asymptomatic with this. Given low LVEDP, 500cc bolus of normal saline was given. Systolic BP improved to the 90's following this.  Orthostatic vital signs were measured following IV hydration and were negative. Patient ambulated with RN without any problems. He did have oozing from his cath site during attempts to remove TR band several hours after cardiac catheterization. However, TR band was ultimately able to be removed. Patient ambulated again after removal of TR band without any complications or bleeding from cath site.  Patient felt to be stable for discharge. Discussed with Dr. Irish Lack - will plan to start Eliquis tomorrow evening (evening of 03/02/2019). OK to restart Plavix tomorrow morning. Discussed activity restrictions with patient. 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   Syncope with Hypotension During Admission Patient reported syncopal episode several days prior to presentation after having intercourse with girlfriend. He states he got up and walked  downstairs and then back upstairs and felt lightheaded/dizzy and passed out for a few seconds.  No palpitations chest pain prior to syncopal episode. Head CT normal in the ED. Echo as above. No arrhythmias noted on telemetry.  Syncopal episode felt to likely be due to to hypotension due to soft BP during admission and low LVEDP cath.  Orthostatic vital signs were performed following IV fluids and were negative.  Encouraged patient to stay well-hydrated.  Patient not on any antihypertensive at home.  Previously on Imdur 15mg  daily but patient states he has not taken this in a while.  Advised patient to continue to monitor BP and heart rate at home.   Headache Patient did complain of posterior headache during admission.  He also notes severe headache on 02/25/2018 with associated blurry vision in righ eye that eventually resolved.  Patient does report he had a TIA in 10/2018 and he does have a history of cerebral venous thrombosis.  He is supposed to be on lifelong and regulation with Xarelto but has not been taking it for the past month due to cost.  No other neurological symptom. Head CT normal.  Patient has prescription of anticoagulation at discharge.  Follow-up with PCP.  Presumed Hypercoagulable Disorder Patient is presumed to have a clotting disorder although he states no official diagnosis has been made.  He had a MI in 2018 due to thrombus formation at the site of likely ruptured plaque.  Also has a history of TIA, DVT, and cerebral venous thrombosis. He is supposed to be  on Xarelto but has trouble affording this so has not taken it in the last month.  It looks like samples of Eliquis were recently given to help patient in the meantime until he could afford Xarelto again.  Consulted case management.  We will give free 30-day supply of Eliquis at discharge. With this in free samples at home, patient will hopefully have time to complete patient assistance form as outpatient. Will plan to restart Eliquis  5mg  twice daily tomorrow night.  Hyperlipidemia Lipid panel this admission: Total Cholesterol 184, Triglycerides 90, HDL 30, LDL 136. LDL goal <70 given CAD. Previously on Lipitor 80mg  daily but has not taken this in a while. Will restart on discharge. Will need repeat lipid panel and LFTs in 6- 8 weeks.   Type 2 Diabetes Mellitus Patient has history of type 2 diabetes; however, he has not any medications. Hemoglobin A1c 5.6 this admission. Follow-up with PCP.   Elevated TSH TSH minimal elevated at 4.612. Free T4 normal. Free T3 still pending. Follow-up with PCP.  Patient was seen and examined by Dr. today and determined to be stable for discharge. Outpatient follow-up has been arranged. Medications as below.   Did the patient have an acute coronary syndrome (MI, NSTEMI, STEMI, etc) this admission?:  No                               Did the patient have a percutaneous coronary intervention (stent / angioplasty)?:  No.   _____________  Discharge Vitals Blood pressure 120/68, pulse 66, temperature 98.2 F (36.8 C), resp. rate 18, height 6\' 1"  (1.854 m), weight 132.3 kg, SpO2 95 %.  Filed Weights   02/28/19 0944 02/28/19 2029 03/01/19 0500  Weight: 130.6 kg 132.3 kg 132.3 kg    Labs & Radiologic Studies    CBC Recent Labs    02/28/19 0948 03/01/19 0004  WBC 8.4 6.1  HGB 14.7 14.0  HCT 45.2 43.0  MCV 88.3 88.3  PLT 184 186   Basic Metabolic Panel Recent Labs    03/03/19 0948 03/01/19 0004  NA 135 135  K 3.6 3.6  CL 103 103  CO2 23 24  GLUCOSE 96 121*  BUN 20 14  CREATININE 1.04 0.88  CALCIUM 9.4 8.9   Liver Function Tests No results for input(s): AST, ALT, ALKPHOS, BILITOT, PROT, ALBUMIN in the last 72 hours. No results for input(s): LIPASE, AMYLASE in the last 72 hours. High Sensitivity Troponin:   Recent Labs  Lab 02/28/19 0948 02/28/19 1206 03/01/19 0004 03/01/19 0226  TROPONINIHS 4 5 3 3     BNP Invalid input(s): POCBNP D-Dimer No results for  input(s): DDIMER in the last 72 hours. Hemoglobin A1C Recent Labs    03/01/19 0004  HGBA1C 5.6   Fasting Lipid Panel Recent Labs    03/01/19 0737  CHOL 184  HDL 30*  LDLCALC 136*  TRIG 90  CHOLHDL 6.1   Thyroid Function Tests Recent Labs    03/01/19 0004  TSH 4.612*   _____________  DG Chest 2 View  Result Date: 02/28/2019 CLINICAL DATA:  Chest pain and shortness of breath EXAM: CHEST - 2 VIEW COMPARISON:  08/15/2018 FINDINGS: Cardiac shadow is within normal limits. The lungs are well aerated bilaterally. No focal infiltrate or sizable effusion is seen. No bony abnormality is noted. IMPRESSION: No active cardiopulmonary disease. Electronically Signed   By: 03/03/19 M.D.   On: 02/28/2019 10:24  CT Head Wo Contrast  Result Date: 02/28/2019 CLINICAL DATA:  Right-sided tingling. History of cerebral venous thrombosis. Numbness or tingling, paresthesia. Additional history provided: Patient reports right chest pain radiating to right arm, weak, dizzy, lightheaded. EXAM: CT HEAD WITHOUT CONTRAST TECHNIQUE: Contiguous axial images were obtained from the base of the skull through the vertex without intravenous contrast. COMPARISON:  Brain MRI 11/16/2018, CT venogram 11/16/2018, CT angiogram head/neck 11/16/2018, noncontrast head CT 11/16/2018 FINDINGS: Brain: No evidence of acute intracranial hemorrhage. No demarcated cortical infarction. No evidence of intracranial mass. No midline shift or extra-axial fluid collection. Cerebral volume is normal for age. Vascular: No hyperdense vessel. Skull: Normal. Negative for fracture or focal lesion. Sinuses/Orbits: Visualized orbits demonstrate no acute abnormality. No significant paranasal sinus disease or mastoid effusion at the imaged levels. IMPRESSION: Unremarkable noncontrast CT appearance of the brain. No evidence of acute intracranial abnormality. Electronically Signed   By: Jackey Loge DO   On: 02/28/2019 10:43   CARDIAC  CATHETERIZATION  Result Date: 03/01/2019  Prox LAD-1 lesion is 30% stenosed.  Non-stenotic Prox LAD-2 lesion was previously treated.  The left ventricular systolic function is normal.  LV end diastolic pressure is normal.  The left ventricular ejection fraction is 50-55% by visual estimate.  1. Nonobstructive CAD. The stent in the proximal LAD is widely patent. 2. Good LV function EF 55% 3. Low LVEDP Plan: medical therapy. OK to resume anticoagulation tomorrow.   ECHOCARDIOGRAM COMPLETE  Result Date: 03/01/2019    ECHOCARDIOGRAM REPORT   Patient Name:   [name] Holland Date of Exam: 03/01/2019 Medical Rec #:  [id number]            Height:       73.0 in Accession #:    [id number]           Weight:       291.7 lb Date of Birth:  1970-11-03            BSA:          2.53 m Patient Age:    48 years             BP:           90/47 mmHg Patient Gender: M                    HR:           83 bpm. Exam Location:  Inpatient Procedure: 2D Echo, Cardiac Doppler and Color Doppler Indications:    Acute Coronary Syndrome I24.9  History:        Patient has prior history of Echocardiogram examinations, most                 recent 07/18/2017. Previous Myocardial Infarction and CAD, Stroke;                 Risk Factors:Diabetes and Dyslipidemia. DVT. Cocaine use.  Sonographer:    Elmarie Shiley Dance Referring Phys: [id number] STEPHANIE FALK MARTIN IMPRESSIONS  1. Left ventricular ejection fraction, by estimation, is 50%. The left ventricle has mildly decreased function. The left ventricle demonstrates regional wall motion abnormalities (see scoring diagram/findings for description). Mid inferolateral hypokinesis (this does not match cath findings). Left ventricular diastolic parameters were normal.  2. Right ventricular systolic function is normal. The right ventricular size is normal. There is normal pulmonary artery systolic pressure. The estimated right ventricular systolic pressure is 20.1 mmHg.  3. Left atrial size was mildly  dilated.  4. Right atrial size was mildly dilated.  5. The mitral valve is normal in structure and function. No evidence of mitral valve regurgitation. No evidence of mitral stenosis.  6. The aortic valve is tricuspid. Aortic valve regurgitation is not visualized. No aortic stenosis is present.  7. Aortic dilatation noted. There is mild dilatation of the aortic root measuring 37 mm.  8. The inferior vena cava is normal in size with greater than 50% respiratory variability, suggesting right atrial pressure of 3 mmHg. FINDINGS  Left Ventricle: Left ventricular ejection fraction, by estimation, is 50%. The left ventricle has mildly decreased function. The left ventricle demonstrates regional wall motion abnormalities. The left ventricular internal cavity size was normal in size. There is no left ventricular hypertrophy. Left ventricular diastolic parameters were normal. Right Ventricle: The right ventricular size is normal. No increase in right ventricular wall thickness. Right ventricular systolic function is normal. There is normal pulmonary artery systolic pressure. The tricuspid regurgitant velocity is 2.07 m/s, and  with an assumed right atrial pressure of 3 mmHg, the estimated right ventricular systolic pressure is 20.1 mmHg. Left Atrium: Left atrial size was mildly dilated. Right Atrium: Right atrial size was mildly dilated. Pericardium: There is no evidence of pericardial effusion. Mitral Valve: The mitral valve is normal in structure and function. No evidence of mitral valve regurgitation. No evidence of mitral valve stenosis. Tricuspid Valve: The tricuspid valve is normal in structure. Tricuspid valve regurgitation is not demonstrated. Aortic Valve: The aortic valve is tricuspid. Aortic valve regurgitation is not visualized. No aortic stenosis is present. Pulmonic Valve: The pulmonic valve was normal in structure. Pulmonic valve regurgitation is trivial. Aorta: Aortic dilatation noted. There is mild  dilatation of the aortic root measuring 37 mm. Venous: The inferior vena cava is normal in size with greater than 50% respiratory variability, suggesting right atrial pressure of 3 mmHg. IAS/Shunts: No atrial level shunt detected by color flow Doppler.  LEFT VENTRICLE PLAX 2D LVIDd:         5.30 cm   Diastology LVIDs:         3.60 cm   LV e' lateral:   7.72 cm/s LV PW:         1.10 cm   LV E/e' lateral: 13.5 LV IVS:        1.10 cm   LV e' medial:    9.14 cm/s LVOT diam:     2.40 cm   LV E/e' medial:  11.4 LV SV:         103.14 ml LV SV Index:   30.41 LVOT Area:     4.52 cm  RIGHT VENTRICLE             IVC RV Basal diam:  3.40 cm     IVC diam: 2.00 cm RV Mid diam:    2.70 cm RV S prime:     12.60 cm/s TAPSE (M-mode): 2.0 cm LEFT ATRIUM              Index       RIGHT ATRIUM           Index LA diam:        4.40 cm  1.74 cm/m  RA Area:     25.80 cm LA Vol (A2C):   59.7 ml  23.64 ml/m RA Volume:   85.25 ml  33.75 ml/m LA Vol (A4C):   101.0 ml 39.99 ml/m LA Biplane Vol: 77.9 ml  30.84 ml/m  AORTIC VALVE LVOT  Vmax:   103.00 cm/s LVOT Vmean:  63.200 cm/s LVOT VTI:    0.228 m  AORTA Ao Root diam: 3.70 cm Ao Asc diam:  3.50 cm MITRAL VALVE                TRICUSPID VALVE MV Area (PHT): 3.53 cm     TR Peak grad:   17.1 mmHg MV Decel Time: 215 msec     TR Vmax:        207.00 cm/s MV E velocity: 104.00 cm/s MV A velocity: 58.00 cm/s   SHUNTS MV E/A ratio:  1.79         Systemic VTI:  0.23 m                             Systemic Diam: 2.40 cm Marca Ancona MD Electronically signed by Marca Ancona MD Signature Date/Time: 03/01/2019/11:50:55 AM    Final    Disposition   Patient is being discharged home today in good condition.  Follow-up Plans & Appointments    Follow-up Information    Runell Gess, MD Follow up.   Specialties: Cardiology, Radiology Why: You already have a visit with Dr. Allyson Sabal scheduled for 03/20/2019 at 8:00am. We can follow-up with you at that time. Please arrive 15 minutes early for  check-in. Contact information: [address] [place] [address] [phone]        [facility] Follow up.   Why: Case Manager will call the [facility] on tomorrow for an appointment and call the patient at home with scheduled visit time.  Contact information: [address] [place] [address] [phone]         Discharge Instructions    Diet - low sodium heart healthy   Complete by: As directed    Increase activity slowly   Complete by: As directed       Discharge Medications   Allergies as of 03/01/2019      Reactions   Formaldehyde Hives      Medication List    STOP taking these medications   diclofenac sodium 1 % Gel Commonly known as: VOLTAREN   HYDROcodone-acetaminophen 5-325 MG tablet Commonly known as: NORCO/VICODIN   isosorbide mononitrate 30 MG 24 hr tablet Commonly known as: IMDUR   lidocaine 5 % Commonly known as: Lidoderm   methocarbamol 500 MG tablet Commonly known as: ROBAXIN   ondansetron 4 MG disintegrating tablet Commonly known as: Zofran ODT   rivaroxaban 20 MG Tabs tablet Commonly known as: Xarelto     TAKE these medications   apixaban 5 MG Tabs tablet Commonly known as: Eliquis Take 1 tablet (5 mg total) by mouth 2 (two) times daily. What changed: additional instructions   atorvastatin 80 MG tablet Commonly known as: LIPITOR Take 1 tablet (80 mg total) by mouth daily at 6 PM.   clopidogrel 75 MG tablet Commonly known as: PLAVIX Take 1 tablet (75 mg total) by mouth daily.   nitroGLYCERIN 0.4 MG SL tablet Commonly known as: NITROSTAT Place 1 tablet (0.4 mg total) under the tongue every 5 (five) minutes as needed for chest pain.          Outstanding Labs/Studies   Lipid panel/LFTs in 6-8 weeks after restarting high-intensity statin.   Duration of Discharge Encounter   Greater than 30 minutes including physician time.  Signed, Corrin Parker,  PA-C 03/01/2019, 5:51 PM  I have examined the patient and reviewed assessment and plan and discussed with patient.  Agree with above as stated.  Cath showed nonobstructive disease.  He had some beleeding from the wrist post procedure.  Will have him start ELiquis tomorrow evening.  Restart Plavix 75 mg daily as well.  No aspirin.   Known hypercoagulable state.  Encourage hydration to prevent syncope.  Hold IMdur as well as BP has been a little borderline.  Syncopal episode was likely due to low BP/dehydration.   Lance Muss

## 2019-03-01 NOTE — Interval H&P Note (Signed)
History and Physical Interval Note:  03/01/2019 10:55 AM  [name] Holland  has presented today for surgery, with the diagnosis of unstable angina.  The various methods of treatment have been discussed with the patient and family. After consideration of risks, benefits and other options for treatment, the patient has consented to  Procedure(s): LEFT HEART CATH AND CORONARY ANGIOGRAPHY (N/A) as a surgical intervention.  The patient's history has been reviewed, patient examined, no change in status, stable for surgery.  I have reviewed the patient's chart and labs.  Questions were answered to the patient's satisfaction.   Cath Lab Visit (complete for each Cath Lab visit)  Clinical Evaluation Leading to the Procedure:   ACS: Yes.    Non-ACS:    Anginal Classification: CCS III  Anti-ischemic medical therapy: Minimal Therapy (1 class of medications)  Non-Invasive Test Results: No non-invasive testing performed  Prior CABG: No previous CABG        Theron Arista [facility] 03/01/2019 10:55 AM

## 2019-03-01 NOTE — Progress Notes (Signed)
   Pt was planned for d/c, but had oozing at R radial cath site and had to put more air in.   Called RN at 8:30. Pt TR band still not off, but he wants to go home tonight if possible, before roads get bad.   Plan is to continue to get air out and TR band off.   Wait 30", then ambulate.  If no bleeding with ambulation, can go home.   Will need to contact the fellow for d/c order, complete d/c summary in am.   Theodore Demark, PA-C 03/01/2019 8:28 PM

## 2019-03-01 NOTE — Progress Notes (Signed)
Orthostatic VS completed. NS bolus administered. Marjie Skiff PA at bedside assessing patient, no new orders given at this time.  Will continue to monitor

## 2019-03-01 NOTE — Discharge Instructions (Signed)
Medication Instructions: - START Plavix 75mg  daily. - START Eliquis 5mg  twice daily. - START Atorvastatin (Lipitor) 40mg  daily. - We will also provide a prescription of Nitroglycerin that you can take as needed for chest pain. However, if you need to take, please sit down before taking as this medication can drop your blood pressure. If the top number of your blood pressure is below 100, I would not take the Nitroglycerin.  It would be helpful if you could purchase an automatic blood pressure machine. Recommend monitor blood pressure and heart rate at home and keeping a log of these measurement to bring to next office visit.   Post-Cardiac Catheterization: NO HEAVY LIFTING OR SEXUAL ACTIVITY X 7 DAYS. NO DRIVING X 2-3 DAYS. NO SOAKING BATHS, HOT TUBS, POOLS, ETC., X 7 DAYS.  Radial Site Care: Refer to this sheet in the next few weeks. These instructions provide you with information on caring for yourself after your procedure. Your caregiver may also give you more specific instructions. Your treatment has been planned according to current medical practices, but problems sometimes occur. Call your caregiver if you have any problems or questions after your procedure. HOME CARE INSTRUCTIONS  You may shower the day after the procedure.Remove the bandage (dressing) and gently wash the site with plain soap and water.Gently pat the site dry.   Do not apply powder or lotion to the site.   Do not submerge the affected site in water for 3 to 5 days.   Inspect the site at least twice daily.   Do not flex or bend the affected arm for 24 hours.   No lifting over 5 pounds (2.3 kg) for 5 days after your procedure.   Do not drive home if you are discharged the same day of the procedure. Have someone else drive you.  What to expect:  Any bruising will usually fade within 1 to 2 weeks.   Blood that collects in the tissue (hematoma) may be painful to the touch. It should usually decrease in size and  tenderness within 1 to 2 weeks.  SEEK IMMEDIATE MEDICAL CARE IF:  You have unusual pain at the radial site.   You have redness, warmth, swelling, or pain at the radial site.   You have drainage (other than a small amount of blood on the dressing).   You have chills.   You have a fever or persistent symptoms for more than 72 hours.   You have a fever and your symptoms suddenly get worse.   Your arm becomes pale, cool, tingly, or numb.   You have heavy bleeding from the site. Hold pressure on the site.

## 2019-03-02 LAB — T3, FREE: T3, Free: 3.1 pg/mL (ref 2.0–4.4)

## 2019-03-02 NOTE — Care Management (Signed)
03/02/2019 1220 - Contacted [facility] at [phone] and scheduled a follow up post-hospitalization appointment on March 14, 2019 at 0910.  Called [name] at home and informed him of his appointment time at [facility] and [facility] along with the address and telephone number in case he would like a new time/date.  Patient verbalized understanding and noted the time.

## 2019-03-14 ENCOUNTER — Ambulatory Visit: Payer: Self-pay | Attending: Family Medicine | Admitting: Physician Assistant

## 2019-03-14 ENCOUNTER — Other Ambulatory Visit: Payer: Self-pay

## 2019-03-20 ENCOUNTER — Ambulatory Visit: Payer: Self-pay | Admitting: Cardiovascular Disease

## 2019-04-03 ENCOUNTER — Encounter: Payer: Self-pay | Admitting: Cardiovascular Disease

## 2019-04-03 NOTE — Progress Notes (Deleted)
Virtual Visit via Telephone Note   This visit type was conducted due to national recommendations for restrictions regarding the COVID-19 Pandemic (e.g. social distancing) in an effort to limit this patient's exposure and mitigate transmission in our community.  Due to his co-morbid illnesses, this patient is at least at moderate risk for complications without adequate follow up.  This format is felt to be most appropriate for this patient at this time.  The patient did not have access to video technology/had technical difficulties with video requiring transitioning to audio format only (telephone).  All issues noted in this document were discussed and addressed.  No physical exam could be performed with this format.  Please refer to the patient's chart for his  consent to telehealth for [name].   The patient was identified using 2 identifiers.  Date:  04/04/2019   ID:  [name] Holland, [name] Holland 12/20/1970, MRN [id number]  Patient Location: Home Provider Location: Office  PCP:  Patient, No Pcp Per  Cardiologist:  Nanetta Batty, MD     Electrophysiologist:  None   Evaluation Performed:  Follow-Up Visit  Chief Complaint: Follow-up CP, CAD, HLD  History of Present Illness:    [name] Holland is a 49 y.o. male with history of CAD s/p DES to proximal LAD 11/30/2016, presumed hypercoagulable disorder with prior TIA, DVT, and cerebral venous thrombosis, HLD, DM type II.  Recently presented to [name] emergency department on 02/28/2019 for evaluation of chest pain.  He described onset of dull achy pain across his chest on the day of presentation occurring at rest but worse with exertion.  Stated it radiated to his right shoulder and right arm with occasional pins-and-needles sensation.  Also reported associated shortness of breath.  He did work out on the day of his presentation without any problems.  States the symptoms started after his workout.  Described pain is somewhat similar to  angina he had before previous PCI.  He also describes a syncopal episode several days prior to presentation after having sex with his girlfriend.  Prodromal symptoms were lightheadedness and dizziness.  States he was unconscious for only a few seconds.  He had no recurrence.  In ED EKG showed no acute ischemic changes.  Troponins negative x2.  Chest x-ray no acute findings, CBC and BMET unremarkable.  Head CT performed secondary to complaints of head and neck pain with no acute findings.  He was transferred to [name] for further work-up.  His troponins remained negative x4.  Cardiac cath showed nonobstructive CAD with a widely patent proximal LAD stent, good LV function EF 55%, low LVEDP.    The patient does not have symptoms concerning for COVID-19 infection (fever, chills, cough, or new shortness of breath).    Past Medical History:  Diagnosis Date  . Acute deep vein thrombosis (DVT) of femoral vein ([name])    dVT  . Arthritis    "knees" (11/23/2016)  . Clotting disorder ([name])   . Cocaine abuse in remission ([name]) 2014  . Coronary artery disease due to lipid rich plaque   . DVT (deep venous thrombosis) ([name] Holland) 05/2016   LLE  . 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 Hyperlipidemia   . Intracranial and intraspinal phlebitis and thrombophlebitis 05/31/2014  . Myocardial infarction ([name]) 2016   "caused by blood clot in the left side of my head"  . Stroke ([name]) 05/2014   denies residual on 11/23/2016  . Type II diabetes  mellitus [facility])    Past Surgical History:  Procedure Laterality Date  . CARDIAC CATHETERIZATION  2008  . CORONARY STENT INTERVENTION N/A 11/24/2016   Procedure: CORONARY STENT INTERVENTION;  Surgeon: Kathleene Hazel, MD;  Location: [facility] INVASIVE CV LAB;  Service: Cardiovascular;  Laterality: N/A;  . CORONARY THROMBECTOMY N/A 11/24/2016   Procedure: Coronary Thrombectomy;  Surgeon: Kathleene Hazel,  MD;  Location: [facility] INVASIVE CV LAB;  Service: Cardiovascular;  Laterality: N/A;  . HIP SURGERY Right   . KNEE ARTHROSCOPY Bilateral early 2000s  . LEFT HEART CATH AND CORONARY ANGIOGRAPHY N/A 11/24/2016   Procedure: LEFT HEART CATH AND CORONARY ANGIOGRAPHY;  Surgeon: Kathleene Hazel, MD;  Location: [facility] INVASIVE CV LAB;  Service: Cardiovascular;  Laterality: N/A;  . LEFT HEART CATH AND CORONARY ANGIOGRAPHY N/A 07/18/2017   Procedure: LEFT HEART CATH AND CORONARY ANGIOGRAPHY;  Surgeon: Yvonne Kendall, MD;  Location: [facility] INVASIVE CV LAB;  Service: Cardiovascular;  Laterality: N/A;  . LEFT HEART CATH AND CORONARY ANGIOGRAPHY N/A 03/01/2019   Procedure: LEFT HEART CATH AND CORONARY ANGIOGRAPHY;  Surgeon: Swazil[place], Peter M, MD;  Location: [facility] INVASIVE CV LAB;  Service: Cardiovascular;  Laterality: N/A;  . WRIST SURGERY Right      No outpatient medications have been marked as taking for the 04/04/19 encounter (Appointment) with Abelino Derrick, PA-C.     Allergies:   Formaldehyde   Social History   Tobacco Use  . Smoking status: Former Smoker    Packs/day: 1.00    Years: 24.00    Pack years: 24.00    Types: Cigarettes    Quit date: 2014    Years since quitting: 7.2  . Smokeless tobacco: Current User    Types: Snuff  Substance Use Topics  . Alcohol use: No  . Drug use: No    Types: Cocaine     Family Hx: The patient's family history includes Asthma in his mother; CAD in his father and maternal grandmother; Cancer in his paternal grandfather; Cerebral aneurysm in his paternal grandmother; Diabetes in his mother; Diabetes type II in his mother; Hyperlipidemia in his father; Hypertension in his father; Lung cancer in his maternal aunt; Other in his mother; Pancreatic cancer in his maternal aunt.  ROS:   Please see the history of present illness.    All other systems reviewed and are negative.   Prior CV studies:   The following studies were reviewed today:  Head CT 02/28/2019:  Impression: Unremarkable noncontrast CT appearance of the brain. No evidence of acute intracranial abnormality. _______________  Left Heart Catheterization 03/01/2019:  Prox LAD-1 lesion is 30% stenosed.  Non-stenotic Prox LAD-2 lesion was previously treated.  The left ventricular systolic function is normal.  LV end diastolic pressure is normal.  The left ventricular ejection fraction is 50-55% by visual estimate.  1. Nonobstructive CAD. The stent in the proximal LAD is widely patent. 2. Good LV function EF 55% 3. Low LVEDP  Plan: medical therapy. OK to resume anticoagulation tomorrow.  _____________  Echocardiogram 03/01/2019: Impressions: 1. Left ventricular ejection fraction, by estimation, is 50%. The left  ventricle has mildly decreased function. The left ventricle demonstrates  regional wall motion abnormalities (see scoring diagram/findings for  description). Mid inferolateral  hypokinesis (this does not match cath findings). Left ventricular  diastolic parameters were normal.  2. Right ventricular systolic function is normal. The right ventricular  size is normal. There is normal pulmonary artery systolic pressure. The  estimated right ventricular systolic pressure is 20.1  mmHg.  3. Left atrial size was mildly dilated.  4. Right atrial size was mildly dilated.  5. The mitral valve is normal in structure and function. No evidence of  mitral valve regurgitation. No evidence of mitral stenosis.  6. The aortic valve is tricuspid. Aortic valve regurgitation is not  visualized. No aortic stenosis is present.  7. Aortic dilatation noted. There is mild dilatation of the aortic root  measuring 37 mm.  8. The inferior vena cava is normal in size with greater than 50%  respiratory variability, suggesting right atrial pressure of 3 mmHg.      Labs/Other Tests and Data Reviewed:    EKG:  {EKG/Telemetry Strips [phone]}  Recent Labs: 11/16/2018: ALT  21 03/01/2019: BUN 14; Creatinine, Ser 0.88; Hemoglobin 14.0; Platelets 186; Potassium 3.6; Sodium 135; TSH 4.612   Recent Lipid Panel Lab Results  Component Value Date/Time   CHOL 184 03/01/2019 07:37 AM   TRIG 90 03/01/2019 07:37 AM   HDL 30 (L) 03/01/2019 07:37 AM   CHOLHDL 6.1 03/01/2019 07:37 AM   LDLCALC 136 (H) 03/01/2019 07:37 AM    Wt Readings from Last 3 Encounters:  03/01/19 291 lb 10.7 oz (132.3 kg)  11/16/18 (!) 312 lb 4.8 oz (141.7 kg)  08/15/18 (!) 310 lb (140.6 kg)     Objective:    Vital Signs:  There were no vitals taken for this visit.   {[facility] Virtual Exam (Optional):[phone]::"VITAL SIGNS:  reviewed"}  ASSESSMENT & PLAN:    1. Unstable angina [facility]) Recent presentation to [facility] for substernal chest pain.  He was evaluated and transferred to [facility].  He underwent a cardiac catheterization which showed nonobstructive disease with a pre-existing stent and proximal LAD which was widely patent.  2. CAD in native artery Previous PCI in 2018 with stent placement to proximal LAD.  3. Syncope, unspecified syncope type Recent brief syncopal episode while having sex with his girlfriend.  No recurrences.  4. Hypercoagulable state ([place]) History of hypercoagulable state with resulting TIA, DVT, cerebral venous thrombosis.  5. Mixed hyperlipidemia Recent LDL 136 on 03/11/2019.  COVID-19 Education: The signs and symptoms of COVID-19 were discussed with the patient and how to seek care for testing (follow up with PCP or arrange E-visit).  The importance of social distancing was discussed today.  Time:   Today, I have spent *** minutes with the patient with telehealth technology discussing the above problems.     Medication Adjustments/Labs and Tests Ordered: Current medicines are reviewed at length with the patient today.  Concerns regarding medicines are outlined above.   Tests Ordered: No orders of the defined types were placed  in this encounter.   Medication Changes: No orders of the defined types were placed in this encounter.   Follow Up:  {F/U Format:[phone]} {follow up:15908}  Signed, Verta Ellen, NP  04/04/2019 8:04 AM    [place]

## 2019-04-04 ENCOUNTER — Telehealth (INDEPENDENT_AMBULATORY_CARE_PROVIDER_SITE_OTHER): Payer: Self-pay | Admitting: Cardiology

## 2019-04-26 NOTE — Progress Notes (Signed)
No show.  Corine Shelter PA-C 04/26/2019 8:17 AM

## 2019-06-13 ENCOUNTER — Other Ambulatory Visit: Payer: Self-pay

## 2019-06-13 ENCOUNTER — Telehealth (HOSPITAL_COMMUNITY): Payer: Self-pay | Admitting: Psychiatry

## 2019-06-19 ENCOUNTER — Other Ambulatory Visit: Payer: Self-pay

## 2019-06-19 ENCOUNTER — Encounter (HOSPITAL_COMMUNITY): Payer: Self-pay | Admitting: Psychiatry

## 2019-06-19 ENCOUNTER — Telehealth (HOSPITAL_COMMUNITY): Payer: Self-pay | Admitting: Psychiatry

## 2020-02-14 ENCOUNTER — Telehealth: Payer: Self-pay

## 2020-02-14 NOTE — Telephone Encounter (Signed)
Called pt and left detailed message that he needs to call back and schedule an office visit.Marland Kitchenthe patient has never been seen and also needs an EKG. Appointment has been cancelled. Will await call back to rescheduled

## 2020-02-15 ENCOUNTER — Telehealth: Payer: Self-pay | Admitting: General Practice

## 2020-12-22 ENCOUNTER — Other Ambulatory Visit: Payer: Self-pay

## 2020-12-22 ENCOUNTER — Emergency Department (HOSPITAL_COMMUNITY): Payer: Self-pay

## 2020-12-22 ENCOUNTER — Encounter (HOSPITAL_COMMUNITY): Payer: Self-pay | Admitting: Emergency Medicine

## 2020-12-22 ENCOUNTER — Emergency Department (HOSPITAL_COMMUNITY)
Admission: EM | Admit: 2020-12-22 | Discharge: 2020-12-22 | Disposition: A | Discharge status: Home / Self Care | Payer: Self-pay | Attending: Emergency Medicine | Admitting: Emergency Medicine

## 2020-12-22 DIAGNOSIS — Z87891 Personal history of nicotine dependence: Secondary | ICD-10-CM | POA: Insufficient documentation

## 2020-12-22 DIAGNOSIS — R0602 Shortness of breath: Secondary | ICD-10-CM | POA: Insufficient documentation

## 2020-12-22 DIAGNOSIS — Z7902 Long term (current) use of antithrombotics/antiplatelets: Secondary | ICD-10-CM | POA: Insufficient documentation

## 2020-12-22 DIAGNOSIS — M542 Cervicalgia: Secondary | ICD-10-CM | POA: Insufficient documentation

## 2020-12-22 DIAGNOSIS — E119 Type 2 diabetes mellitus without complications: Secondary | ICD-10-CM | POA: Insufficient documentation

## 2020-12-22 DIAGNOSIS — I10 Essential (primary) hypertension: Secondary | ICD-10-CM | POA: Insufficient documentation

## 2020-12-22 DIAGNOSIS — Z7901 Long term (current) use of anticoagulants: Secondary | ICD-10-CM | POA: Insufficient documentation

## 2020-12-22 DIAGNOSIS — Z79899 Other long term (current) drug therapy: Secondary | ICD-10-CM | POA: Insufficient documentation

## 2020-12-22 DIAGNOSIS — Z955 Presence of coronary angioplasty implant and graft: Secondary | ICD-10-CM | POA: Insufficient documentation

## 2020-12-22 DIAGNOSIS — F419 Anxiety disorder, unspecified: Secondary | ICD-10-CM | POA: Insufficient documentation

## 2020-12-22 DIAGNOSIS — I209 Angina pectoris, unspecified: Secondary | ICD-10-CM

## 2020-12-22 DIAGNOSIS — I25119 Atherosclerotic heart disease of native coronary artery with unspecified angina pectoris: Secondary | ICD-10-CM | POA: Insufficient documentation

## 2020-12-22 LAB — BASIC METABOLIC PANEL
Anion gap: 7 (ref 5–15)
BUN: 15 mg/dL (ref 6–20)
CO2: 27 mmol/L (ref 22–32)
Calcium: 9 mg/dL (ref 8.9–10.3)
Chloride: 104 mmol/L (ref 98–111)
Creatinine, Ser: 0.95 mg/dL (ref 0.61–1.24)
GFR, Estimated: >60 mL/min (ref >60)
Glucose, Bld: 139 mg/dL — ABNORMAL HIGH (ref 70–99)
Potassium: 4.2 mmol/L (ref 3.5–5.1)
Sodium: 138 mmol/L (ref 135–145)

## 2020-12-22 LAB — CBC
HCT: 45 % (ref 39.0–52.0)
Hemoglobin: 14.8 g/dL (ref 13.0–17.0)
MCH: 29.6 pg (ref 26.0–34.0)
MCHC: 32.9 g/dL (ref 30.0–36.0)
MCV: 90 fL (ref 80.0–100.0)
Platelets: 177 10*3/uL (ref 150–400)
RBC: 5 MIL/uL (ref 4.22–5.81)
RDW: 13.7 % (ref 11.5–15.5)
WBC: 8.5 10*3/uL (ref 4.0–10.5)
nRBC: 0 % (ref 0.0–0.2)

## 2020-12-22 LAB — TROPONIN I (HIGH SENSITIVITY)
Troponin I (High Sensitivity): 3 ng/L (ref <18)
Troponin I (High Sensitivity): 3 ng/L (ref <18)

## 2020-12-22 MED ORDER — MORPHINE SULFATE (PF) 4 MG/ML IV SOLN
4.0000 mg | Freq: Once | INTRAVENOUS | Status: AC
  Administered 2020-12-22: 4 mg via INTRAVENOUS
  Filled 2020-12-22: qty 1

## 2020-12-22 MED ORDER — ASPIRIN 81 MG PO CHEW
324.0000 mg | CHEWABLE_TABLET | Freq: Once | ORAL | Status: AC
  Administered 2020-12-22: 324 mg via ORAL
  Filled 2020-12-22: qty 4

## 2020-12-22 MED ORDER — MORPHINE SULFATE (PF) 4 MG/ML IV SOLN: 4.0000 mg | Freq: Once | INTRAVENOUS | Status: DC

## 2020-12-22 MED ORDER — NITROGLYCERIN 0.4 MG SL SUBL
0.4000 mg | SUBLINGUAL_TABLET | SUBLINGUAL | Status: DC | PRN
  Administered 2020-12-22: 0.4 mg via SUBLINGUAL
  Filled 2020-12-22: qty 1

## 2020-12-22 MED ORDER — ONDANSETRON HCL 4 MG/2ML IJ SOLN
4.0000 mg | Freq: Once | INTRAMUSCULAR | Status: AC
  Administered 2020-12-22: 4 mg via INTRAVENOUS
  Filled 2020-12-22: qty 2

## 2020-12-22 NOTE — ED Triage Notes (Signed)
Patient complains of chest pain which started last night around 2230. Pain radiated from the chest into the right jaw. Now the pain has radiated into right arm. Nitro helped last night. Today the patient awoke with slight chest, jaw neck and right arm pain. Also reports nausea. Nitro taken today with some relief.

## 2020-12-22 NOTE — ED Provider Notes (Signed)
Emergency Medicine Provider Triage Evaluation Note  [name] , a 50 y.o. male  was evaluated in triage.  Pt complains of central chest pain that radiates to the back, also reports posterior neck pain with transient episodes of numbness tingling to the right side of his face.  Reports episode came on last night around 11:00 lasted about 1 hour resolved following 2 nitros.  Had another episode this morning around 1030 without resolution after 1 nitro.  Review of Systems  Positive: Chest pain, back pain, posterior neck pain diaphoresis Negative: Fever, shortness of breath  Physical Exam  BP (!) 169/96 (BP Location: Right Arm)   Pulse 84   Temp 98.1 F (36.7 C) (Oral)   Resp 18   SpO2 96%  Gen:   Awake, no distress   Resp:  Normal effort  MSK:   Moves extremities without difficulty  Other:  Symmetrical blood pressures in bilateral arms  Medical Decision Making  Medically screening exam initiated at 2:57 PM.  Appropriate orders placed.  [name] was informed that the remainder of the evaluation will be completed by another provider, this initial triage assessment does not replace that evaluation, and the importance of remaining in the ED until their evaluation is complete.     [name], PA-C 12/22/20 1458    [name], MD 12/23/20 [phone]

## 2020-12-22 NOTE — Discharge Instructions (Signed)
We saw you in the ER for the chest pain/shortness of breath. All of our cardiac workup is normal, including labs, EKG and chest X-RAY are normal. We are not sure what is causing your discomfort, but given your cardiac history -still concerned about coronary artery disease as the cause.  We did discuss with you admission to the hospital for observation versus close follow-up with the cardiology team, you have preferred the latter.  Please call the number provided to set up an appointment as soon as possible with cardiology. Start taking daily 81 mg aspirin.  Please return to the ER if you have worsening chest pain, shortness of breath.

## 2020-12-22 NOTE — ED Provider Notes (Signed)
[facility]-EMERGENCY DEPT Provider Note   CSN: [id number] Arrival date & time: 12/22/20  1416     History Chief Complaint  Patient presents with   Chest Pain    [name] Holland is a 50 y.o. male with a past medical history of hypercoagulability, known CAD status post stent of the proximal LAD, history of NSTEMI and unstable angina, type 2 diabetes, intracranial and intraspinal phlebitis and thrombophlebitis, history of cerebral venous infarction who presents emergency department with chief complaint of chest pain.  Patient states that last night around 10:45 PM he began having Precordial chest pain right-sided more than left radiating into his jaw, neck, shoulder, right shoulder blade and right arm.  He states that the pain was extremely intense and he felt short of breath.  He took a nitroglycerin with only mild relief, 5 minutes later he took another nitroglycerin and after about 15 minutes his pain became very dull.  He was able to go to sleep.  This morning he woke with the same chest pressure and pain, dull in nature.  He states that he also felt very lightheaded.  He denies palpitations.  Patient states that around 10:36 PM he began having intense pressure in his chest again with pain in the same pattern with associated nausea and diaphoresis.  He took another nitroglycerin and states that his chest pain improved.  He really wanted to finish up his work for the day and states he continued to have the pain and then came in for evaluation.  He currently rates the pain in the back of his neck jaw and shoulder at 8 out of 10.  He does not have any shortness of breath or diaphoresis or nausea at this time.  He did not take any aspirin today.  And patient also has a history of hypercoagulability and has not taken his Eliquis for the past 4 months due to cost and is also not been taking his statin therapy.  He states that the pain he is having feels similar to previous heart  attacks.  Last heart cath was in February 2021 showed 30% stenosis in the proximal LAD with patent stent and nonobstructive coronary artery disease.      Chest Pain     Past Medical History:  Diagnosis Date   Acute deep vein thrombosis (DVT) of femoral vein (HCC)    dVT   Arthritis    "knees" (11/23/2016)   Clotting disorder (HCC)    Cocaine abuse in remission [facility]) 2014   Coronary artery disease due to lipid rich plaque    DVT (deep venous thrombosis) (HCC) 05/2016   LLE   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   Hyperlipidemia    Intracranial and intraspinal phlebitis and thrombophlebitis 05/31/2014   Myocardial infarction (HCC) 2016   "caused by blood clot in the left side of my head"   Stroke (HCC) 05/2014   denies residual on 11/23/2016   Type II diabetes mellitus [facility])     Patient Active Problem List   Diagnosis Date Noted   Syncope 03/01/2019   Hypotensive episode 03/01/2019   Hypercoagulable state (HCC) 03/01/2019   Elevated TSH 03/01/2019   Unstable angina (HCC)    Chest pain 07/17/2017   CAD (coronary artery disease)    Non-ST elevation (NSTEMI) myocardial infarction [facility])    Long term current use of anticoagulant 06/17/2016   Mixed hyperlipidemia 05/31/2016   Bilateral primary osteoarthritis of knee 04/21/2015  Cocaine abuse in remission (HCC) 04/21/2015   Obesity (BMI 30-39.9)    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, without long-term current use of insulin (HCC) 05/31/2014   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    Past Surgical History:  Procedure Laterality Date   CARDIAC CATHETERIZATION  2008   CORONARY STENT INTERVENTION N/A 11/24/2016   Procedure: CORONARY STENT INTERVENTION;  Surgeon: Kathleene Hazel, MD;  Location: [facility] INVASIVE CV LAB;  Service: Cardiovascular;  Laterality: N/A;   CORONARY  THROMBECTOMY N/A 11/24/2016   Procedure: Coronary Thrombectomy;  Surgeon: Kathleene Hazel, MD;  Location: [facility] INVASIVE CV LAB;  Service: Cardiovascular;  Laterality: N/A;   HIP SURGERY Right    KNEE ARTHROSCOPY Bilateral early 2000s   LEFT HEART CATH AND CORONARY ANGIOGRAPHY N/A 11/24/2016   Procedure: LEFT HEART CATH AND CORONARY ANGIOGRAPHY;  Surgeon: Kathleene Hazel, MD;  Location: [facility] INVASIVE CV LAB;  Service: Cardiovascular;  Laterality: N/A;   LEFT HEART CATH AND CORONARY ANGIOGRAPHY N/A 07/18/2017   Procedure: LEFT HEART CATH AND CORONARY ANGIOGRAPHY;  Surgeon: Yvonne Kendall, MD;  Location: [facility] INVASIVE CV LAB;  Service: Cardiovascular;  Laterality: N/A;   LEFT HEART CATH AND CORONARY ANGIOGRAPHY N/A 03/01/2019   Procedure: LEFT HEART CATH AND CORONARY ANGIOGRAPHY;  Surgeon: [place], Peter M, MD;  Location[facility] INVASIVE CV LAB;  Service: Cardiovascular;  Laterality: N/A;   WRIST SURGERY Right        Family History  Problem Relation Age of Onset   Other Mother    Diabetes type II Mother    Asthma Mother    Diabetes Mother    CAD Father        CABG   Hypertension Father    Hyperlipidemia Father    Pancreatic cancer Maternal Aunt    Lung cancer Maternal Aunt    CAD Maternal Grandmother    Cerebral aneurysm Paternal Grandmother    Cancer Paternal Grandfather        unknown    Social History   Tobacco Use   Smoking status: Former    Packs/day: 1.00    Years: 24.00    Pack years: 24.00    Types: Cigarettes    Quit date: 2014    Years since quitting: 8.9   Smokeless tobacco: Current    Types: Snuff  Vaping Use   Vaping Use: Never used  Substance Use Topics   Alcohol use: No   Drug use: No    Types: Cocaine    Home Medications Prior to Admission medications   Medication Sig Start Date End Date Taking? Authorizing Provider  apixaban (ELIQUIS) 5 MG TABS tablet Take 1 tablet (5 mg total) by mouth 2 (two) times daily. 03/01/19   Corrin Parker, PA-C   atorvastatin (LIPITOR) 80 MG tablet Take 1 tablet (80 mg total) by mouth daily at 6 PM. 03/01/19   Corrin Parker, PA-C  clopidogrel (PLAVIX) 75 MG tablet Take 1 tablet (75 mg total) by mouth daily. 03/01/19   Corrin Parker, PA-C  nitroGLYCERIN (NITROSTAT) 0.4 MG SL tablet Place 1 tablet (0.4 mg total) under the tongue every 5 (five) minutes as needed for chest pain. 03/01/19 05/30/19  Corrin Parker, PA-C    Allergies    Formaldehyde  Review of Systems   Review of Systems  Cardiovascular:  Positive for chest pain.  Ten systems reviewed and are negative for acute change, except as noted in the HPI.  Physical Exam Updated Vital Signs BP (!) 137/97   Pulse 80   Temp 98.2 F (36.8 C) (Oral)   Resp 16   Ht 6\' 1"  (1.854 m)   Wt (!) 149.7 kg   SpO2 97%   BMI 43.54 kg/m   Physical Exam Vitals and nursing note reviewed.  Constitutional:      General: He is not in acute distress.    Appearance: He is well-developed. He is not diaphoretic.  HENT:     Head: Normocephalic and atraumatic.  Eyes:     General: No scleral icterus.    Conjunctiva/sclera: Conjunctivae normal.  Cardiovascular:     Rate and Rhythm: Normal rate and regular rhythm.     Heart sounds: Normal heart sounds.  Pulmonary:     Effort: Pulmonary effort is normal. No respiratory distress.     Breath sounds: Normal breath sounds.  Abdominal:     Palpations: Abdomen is soft.     Tenderness: There is no abdominal tenderness.  Musculoskeletal:     Cervical back: Normal range of motion and neck supple.  Skin:    General: Skin is warm and dry.  Neurological:     Mental Status: He is alert.  Psychiatric:        Mood and Affect: Mood is anxious.        Behavior: Behavior normal.    ED Results / Procedures / Treatments   Labs (all labs ordered are listed, but only abnormal results are displayed) Labs Reviewed  BASIC METABOLIC PANEL - Abnormal; Notable for the following components:      Result Value    Glucose, Bld 139 (*)    All other components within normal limits  CBC  TROPONIN I (HIGH SENSITIVITY)  TROPONIN I (HIGH SENSITIVITY)    EKG EKG Interpretation  Date/Time:  Monday December 22 2020 14:25:27 EST Ventricular Rate:  81 PR Interval:  149 QRS Duration: 103 QT Interval:  358 QTC Calculation: 416 R Axis:   52 Text Interpretation: Sinus rhythm Inferior infarct, old - q wave [id number] is new compared to last ekg Confirmed by 02-23-2001 [phone]) on 12/22/2020 3:49:00 PM  Radiology DG Chest 2 View  Result Date: 12/22/2020 CLINICAL DATA:  Chest pain EXAM: CHEST - 2 VIEW COMPARISON:  02/28/2019 FINDINGS: The heart size and mediastinal contours are within normal limits. Both lungs are clear. The visualized skeletal structures are unremarkable. IMPRESSION: No active cardiopulmonary disease. Electronically Signed   By: 03/02/2019 M.D.   On: 12/22/2020 15:21    Procedures Procedures   Medications Ordered in ED Medications  nitroGLYCERIN (NITROSTAT) SL tablet 0.4 mg (0.4 mg Sublingual Given 12/22/20 1540)  ondansetron (ZOFRAN) injection 4 mg (4 mg Intravenous Given 12/22/20 1540)  aspirin chewable tablet 324 mg (324 mg Oral Given 12/22/20 1541)  morphine 4 MG/ML injection 4 mg (4 mg Intravenous Given 12/22/20 1541)  morphine 4 MG/ML injection 4 mg (4 mg Intravenous Given 12/22/20 1719)    ED Course  I have reviewed the triage vital signs and the nursing notes.  Pertinent labs & imaging results that were available during my care of the patient were reviewed by me and considered in my medical decision making (see chart for details).  Clinical Course as of 12/22/20 2154  Mon Dec 22, 2020  1735 Case discussed with Dr. Dec 14, 2022Val Eagle of cardiology. We reviewed his labs and EKG.  He did not feel that his pain is related to a cardiac issue at this point given  his catheterization in February 2021. [AH]    Clinical Course User Index [AH] Arthor Captain, PA-C   MDM  Rules/Calculators/A&P  Given the large differential diagnosis for [name] Holland, the decision making in this case is of high complexity.  I ordered and reviewed labs that included CBC and BMP which only shows elevated blood glucose otherwise within normal limits, troponin is negative x2 with constant chest pain since yesterday.  After evaluating all of the data points in this case, the presentation of [name] Holland is NOT consistent with Acute Coronary Syndrome (ACS) and/or myocardial ischemia, pulmonary embolism, aortic dissection; Borhaave's, significant arrythmia, pneumothorax, cardiac tamponade, or other emergent cardiopulmonary condition.  Further, the presentation of [name] Holland is NOT consistent with pericarditis, myocarditis, cholecystitis, pancreatitis, mediastinitis, endocarditis, new valvular disease.  Additionally, the presentation of [name] Holland NOT consistent with flail chest, cardiac contusion, ARDS, or significant intra-thoracic or intra-abdominal bleeding.  Moreover, this presentation is NOT consistent with pneumonia, sepsis, or pyelonephritis.    Strict return and follow-up precautions have been given by me personally or by detailed written instruction given verbally by nursing staff using the teach back method to the patient/family/caregiver(s).  Data Reviewed/Counseling: I have reviewed the patient's vital signs, nursing notes, and other relevant tests/information. I had a detailed discussion regarding the historical points, exam findings, and any diagnostic results supporting the discharge diagnosis. I also discussed the need for outpatient follow-up and the need to return to the ED if symptoms worsen or if there are any questions or concerns that arise at home.   Final Clinical Impression(s) / ED Diagnoses Final diagnoses:  Angina pectoris [facility])    Rx / DC Orders ED Discharge Orders     None        Arthor Captain, PA-C 12/22/20 2155     Derwood Kaplan, MD 12/23/20 2124

## 2021-10-19 ENCOUNTER — Encounter: Payer: Self-pay | Admitting: Cardiology

## 2021-10-19 ENCOUNTER — Ambulatory Visit: Payer: 59 | Admitting: Cardiology

## 2021-10-19 VITALS — BP 112/76 | HR 74 | Temp 97.7°F | Resp 16 | Ht 73.0 in | Wt 318.2 lb

## 2021-10-19 DIAGNOSIS — F1411 Cocaine abuse, in remission: Secondary | ICD-10-CM

## 2021-10-19 DIAGNOSIS — Z955 Presence of coronary angioplasty implant and graft: Secondary | ICD-10-CM

## 2021-10-19 DIAGNOSIS — I10 Essential (primary) hypertension: Secondary | ICD-10-CM

## 2021-10-19 DIAGNOSIS — F1011 Alcohol abuse, in remission: Secondary | ICD-10-CM

## 2021-10-19 DIAGNOSIS — E1165 Type 2 diabetes mellitus with hyperglycemia: Secondary | ICD-10-CM

## 2021-10-19 DIAGNOSIS — I2510 Atherosclerotic heart disease of native coronary artery without angina pectoris: Secondary | ICD-10-CM

## 2021-10-19 DIAGNOSIS — Z87891 Personal history of nicotine dependence: Secondary | ICD-10-CM

## 2021-10-19 MED ORDER — ROSUVASTATIN CALCIUM 20 MG PO TABS
20.0000 mg | ORAL_TABLET | Freq: Every day | ORAL | 0 refills | Status: AC
Start: 2021-10-19 — End: 2022-01-17

## 2021-10-19 NOTE — Progress Notes (Signed)
ID:  [name] Holland, DOB 1970-09-23, MRN [id number]  PCP:  Patient, No Pcp Per  Cardiologist:  Rex Kras, DO, [facility] (established care 10/19/2021) Former Cardiology Providers: Dr. Quay Burow  REASON FOR CONSULT: CAD  REQUESTING PHYSICIAN:  Geannie Risen, MD [address] [place],  [place] [address]  Chief Complaint  Patient presents with   Coronary Artery Disease   New Patient (Initial Visit)    HPI  [name] Holland is a 51 y.o.  male who presents to the clinic for evaluation of hx of CAD at the request of Geannie Risen, MD. His past medical history and cardiovascular risk factors include: Alcohol abuse, cocaine use, cigarette smoker, bipolar, insomnia, diabetes mellitus type 2, CAD status post DES to the proximal LAD 11/2016, history of TIA/DVT/cerebral venous thrombosis (presumed hypercoagulable), hyperlipidemia.   Patient has a history of polysubstance abuse including cocaine and methamphetamines.  He was clean for several years until  September 2023 he relapsed.  His last use of cocaine and methamphetamine was in September 2023.  He is now in [facility] which is one of the addiction treatment centers within the community.  He is referred by Dr. Laural Benes to cardiology given his history of CAD dating back to November 2018.  He had a stent to the LAD.  His last angiography was in 2021 which noted stent patency.  He is currently not on statin therapy.  He is on full dose aspirin.  It appears the patient carries a diagnosis of hypercoagulable state as he has had a history of TIA/DVT/cerebral venous thrombosis.  He used to be on Xarelto but has not been on it for at least 1 year or more.  He is currently on full dose aspirin.  Clinically denies anginal discomfort or heart failure symptoms.   Outside labs independently reviewed.  As noted below for further reference.  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  But no structured  exercise program or daily routine.  ALLERGIES: Allergies  Allergen Reactions   Formaldehyde Hives    MEDICATION LIST PRIOR TO VISIT: Current Meds  Medication Sig   aspirin 325 MG tablet Take 325 mg by mouth every morning.   metFORMIN (GLUCOPHAGE-XR) 500 MG 24 hr tablet Take 1 tablet by mouth daily at 12 noon.   Multiple Vitamins-Minerals (MULTI COMPLETE PO) Take 1 tablet by mouth daily at 12 noon.   nitroGLYCERIN (NITROSTAT) 0.4 MG SL tablet Place 0.4 mg under the tongue every 5 (five) minutes as needed for chest pain.   Omega 3-6-9 Fatty Acids (OMEGA-3-6-9 PO) Take 1 tablet by mouth daily at 12 noon.   rosuvastatin (CRESTOR) 20 MG tablet Take 1 tablet (20 mg total) by mouth at bedtime.   sertraline (ZOLOFT) 25 MG tablet Take 25 mg by mouth every morning.   thiamine (VITAMIN B1) 100 MG tablet Take 100 mg by mouth every morning.   traZODone (DESYREL) 50 MG tablet Take 50 mg by mouth at bedtime as needed.     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 05/31/2014   Hyperlipidemia    Intracranial and intraspinal phlebitis and thrombophlebitis 05/31/2014   Myocardial infarction [facility]) 2016   "caused by blood clot in the left side of my head"   Stroke ([place]) 05/2014   denies residual on 11/23/2016   Type II diabetes mellitus ([place])     PAST SURGICAL HISTORY: Past Surgical History:  Procedure Laterality Date   CARDIAC CATHETERIZATION  2008   CORONARY STENT INTERVENTION N/A 11/24/2016   Procedure: CORONARY STENT INTERVENTION;  Surgeon: Burnell Blanks, MD;  Location: [place] [facility];  Service:  Cardiovascular;  Laterality: N/A;   CORONARY THROMBECTOMY N/A 11/24/2016   Procedure: Coronary Thrombectomy;  Surgeon: Burnell Blanks, MD;  Location: [place] [facility];  Service: Cardiovascular;  Laterality: N/A;   HIP SURGERY Right    KNEE ARTHROSCOPY Bilateral early 2000s   LEFT HEART CATH AND CORONARY ANGIOGRAPHY N/A 11/24/2016   Procedure: LEFT HEART CATH AND CORONARY ANGIOGRAPHY;  Surgeon: Burnell Blanks, MD;  Location: [place] [facility];  Service: Cardiovascular;  Laterality: N/A;   LEFT HEART CATH AND CORONARY ANGIOGRAPHY N/A 07/18/2017   Procedure: LEFT HEART CATH AND CORONARY ANGIOGRAPHY;  Surgeon: Nelva Bush, MD;  Location: [place] [facility];  Service: Cardiovascular;  Laterality: N/A;   LEFT HEART CATH AND CORONARY ANGIOGRAPHY N/A 03/01/2019   Procedure: LEFT HEART CATH AND CORONARY ANGIOGRAPHY;  Surgeon: Martinique, Peter M, MD;  Location: [place] [facility];  Service: Cardiovascular;  Laterality: N/A;   WRIST SURGERY Right     FAMILY HISTORY: The patient family history includes Asthma in his mother; CAD in his father and maternal grandmother; Cancer in his paternal grandfather; Cerebral aneurysm in his paternal grandmother; Diabetes in his mother; Diabetes type II in his mother; Hyperlipidemia in his father; Hypertension in his father; Lung cancer in his maternal aunt; Other in his mother; Pancreatic cancer in his maternal aunt.  SOCIAL HISTORY:  The patient  reports that he quit smoking about 9 years ago. His smoking use included cigarettes. He has a 24.00 pack-year smoking history. His smokeless tobacco use includes snuff. He reports current alcohol use. He reports current drug use. Drug: Cocaine.  REVIEW OF SYSTEMS: Review of Systems  Cardiovascular:  Negative for chest pain, claudication, dyspnea on exertion, irregular heartbeat, leg swelling, near-syncope, orthopnea, palpitations, paroxysmal nocturnal dyspnea and syncope.  Respiratory:  Negative for  shortness of breath.   Hematologic/Lymphatic: Negative for bleeding problem.  Musculoskeletal:  Negative for muscle cramps and myalgias.  Neurological:  Negative for dizziness and light-headedness.    PHYSICAL EXAM:    10/19/2021   10:07 AM 12/22/2020    6:30 PM 12/22/2020    5:30 PM  Vitals with BMI  Height 6' 1"     Weight 318 lbs 3 oz    BMI 44.01    Systolic 027 253 664  Diastolic 76 97 84  Pulse 74 80 92    CONSTITUTIONAL: Well-developed and well-nourished. No acute distress.  SKIN: Skin is warm and dry. No rash noted. No cyanosis. No pallor. No jaundice. tattoos HEAD: Normocephalic and atraumatic.  EYES: No scleral icterus MOUTH/THROAT: Moist oral membranes.  NECK: No JVD present. No thyromegaly noted. No carotid bruits  CHEST Normal respiratory effort. No intercostal retractions  LUNGS: Clear to auscultation bilaterally.  No stridor. No wheezes. No rales.  CARDIOVASCULAR: Regular rate and rhythm, positive S1-S2, no murmurs rubs or gallops appreciated. ABDOMINAL: Obese, soft, nontender, nondistended, positive bowel sounds all 4 quadrants. No apparent  ascites.  EXTREMITIES: No peripheral edema, warm to touch, 2+ bilaterally DP and PT pulses HEMATOLOGIC: No significant bruising NEUROLOGIC: Oriented to person, place, and time. Nonfocal. Normal muscle tone.  PSYCHIATRIC: Normal mood and affect. Normal behavior. Cooperative  CARDIAC DATABASE: EKG: 10/19/2021: Sinus rhythm, 67 bpm, without underlying injury pattern.  Echocardiogram: 03/01/2019: LVEF 50%, regional wall motion abnormalities (mid inferolateral hypokinesis), mildly dilated left atrium, aortic root 37 mm, RAP 3 mmHg.  Stress Testing: No results found for this or any previous visit from the past 1095 days.   Heart Catheterization: Left Heart Catheterization 03/01/2019: Prox LAD-1 lesion is 30% stenosed. Non-stenotic Prox LAD-2 lesion was previously treated. The left ventricular systolic function is  normal. LV end diastolic pressure is normal. The left ventricular ejection fraction is 50-55% by visual estimate.   1. Nonobstructive CAD. The stent in the proximal LAD is widely patent. 2. Good LV function EF 55% 3. Low LVEDP  LABORATORY DATA: External Labs: Collected: 09/23/2021 provided by referring physician. Total cholesterol 201, triglyceride 159, HDL 34, LDL 138, non-HDL 167. Magnesium 2. Creatinine 1 mg/dL, BUN 14. Sodium 139, potassium 4.2, chloride 103, bicarb 28. AST 39, ALT 37, alkaline phosphatase 78. Hemoglobin 17.1 g/dL, hematocrit 53%. TSH 2.33. A1c 6.7  IMPRESSION:    ICD-10-CM   1. Coronary artery disease involving native coronary artery of native heart without angina pectoris  I25.10 EKG 12-Lead    rosuvastatin (CRESTOR) 20 MG tablet    Lipid Panel With LDL/HDL Ratio    LDL cholesterol, direct    CMP14+EGFR    PCV ECHOCARDIOGRAM COMPLETE    2. History of placement of stent in LAD coronary artery  Z95.5     3. Benign hypertension  I10     4. Type 2 diabetes mellitus with hyperglycemia, without long-term current use of insulin (HCC)  E11.65 Lipid Panel With LDL/HDL Ratio    LDL cholesterol, direct    CMP14+EGFR    5. History of alcohol abuse  F10.11     6. History of cocaine abuse ([place])  F14.11     7. Former smoker  Z87.891        RECOMMENDATIONS: [name] Holland is a 51 y.o.  male whose past medical history and cardiac risk factors include: Alcohol abuse, cocaine use, cigarette smoker, bipolar, insomnia, diabetes mellitus type 2, CAD status post DES to the proximal LAD 11/2016, history of TIA/DVT/cerebral venous thrombosis (presumed hypercoagulable), hyperlipidemia.   Coronary artery disease involving native coronary artery of native heart without angina pectoris Denies angina pectoris. EKG nonischemic. History of PCI to the LAD in November 2018 and last ischemic work-up in 2021. Echo will be ordered to evaluate for structural heart disease and  left ventricular systolic function. Continue aspirin Start Crestor 20 mg p.o. nightly with repeat lipid and CMP in 6 weeks. No recent use of nitroglycerin tablets. Reemphasized importance of complete cessation of illicit drugs. Reemphasized the importance of improving his modifiable cardiovascular risk factors.  Benign hypertension Office blood pressures are well controlled. Currently not on antihypertensive medications. Currently managed by primary care provider.  Type 2 diabetes mellitus with hyperglycemia, without long-term current use of insulin (HCC) A1c within acceptable limits. Currently on metformin. Consider Jardiance -we will defer to PCP at this time Consider low-dose ARB/ACE inhibitor for renal protection -defer to PCP at this time. We will start statin therapy as discussed above  History of alcohol abuse / History of cocaine abuse [facility]) Last use September 2023. Currently enrolled for fellowship hall He  is motivated to stop the consumption of excessive alcohol, cocaine, and amphetamines.  Patient was sober for many years but relapsed as he stopped going to meetings.  Patient has a history of TIA/DVT/cerebral venous thrombosis per electronic medical records.  He has a documented history of presumed hypercoagulable state.  I have asked him to be formally diagnosed/evaluated by hematology.  I have asked him to discuss being referred to hematology for further evaluation and management.  If and when he gets restarted on anticoagulation he can reduce the aspirin to 81 mg p.o. daily.  He verbalizes understanding.  We will send a copy of the office note to referring physician to help coordinate care.  Data Reviewed: I have independently reviewed external notes provided by the referring provider as part of this office visit.   I have independently reviewed labs, notes, ekg, cath and echo reports as part of medical decision making. I have ordered the following tests:  Orders Placed  This Encounter  Procedures   Lipid Panel With LDL/HDL Ratio    Standing Status:   Future    Standing Expiration Date:   10/20/2022   LDL cholesterol, direct    Standing Status:   Future    Standing Expiration Date:   10/20/2022   CMP14+EGFR    Standing Status:   Future    Standing Expiration Date:   10/20/2022   EKG 12-Lead   PCV ECHOCARDIOGRAM COMPLETE    Standing Status:   Future    Standing Expiration Date:   10/20/2022  I have made medications changes at today's encounter as noted above.  FINAL MEDICATION LIST END OF ENCOUNTER: Meds ordered this encounter  Medications   rosuvastatin (CRESTOR) 20 MG tablet    Sig: Take 1 tablet (20 mg total) by mouth at bedtime.    Dispense:  90 tablet    Refill:  0    Medications Discontinued During This Encounter  Medication Reason   nitroGLYCERIN (NITROSTAT) 0.4 MG SL tablet    clopidogrel (PLAVIX) 75 MG tablet    atorvastatin (LIPITOR) 80 MG tablet    apixaban (ELIQUIS) 5 MG TABS tablet      Current Outpatient Medications:    aspirin 325 MG tablet, Take 325 mg by mouth every morning., Disp: , Rfl:    metFORMIN (GLUCOPHAGE-XR) 500 MG 24 hr tablet, Take 1 tablet by mouth daily at 12 noon., Disp: , Rfl:    Multiple Vitamins-Minerals (MULTI COMPLETE PO), Take 1 tablet by mouth daily at 12 noon., Disp: , Rfl:    nitroGLYCERIN (NITROSTAT) 0.4 MG SL tablet, Place 0.4 mg under the tongue every 5 (five) minutes as needed for chest pain., Disp: , Rfl:    Omega 3-6-9 Fatty Acids (OMEGA-3-6-9 PO), Take 1 tablet by mouth daily at 12 noon., Disp: , Rfl:    rosuvastatin (CRESTOR) 20 MG tablet, Take 1 tablet (20 mg total) by mouth at bedtime., Disp: 90 tablet, Rfl: 0   sertraline (ZOLOFT) 25 MG tablet, Take 25 mg by mouth every morning., Disp: , Rfl:    thiamine (VITAMIN B1) 100 MG tablet, Take 100 mg by mouth every morning., Disp: , Rfl:    traZODone (DESYREL) 50 MG tablet, Take 50 mg by mouth at bedtime as needed., Disp: , Rfl:   Orders Placed This  Encounter  Procedures   Lipid Panel With LDL/HDL Ratio   LDL cholesterol, direct   CMP14+EGFR   EKG 12-Lead   PCV ECHOCARDIOGRAM COMPLETE    There are no Patient Instructions  on file for this visit.   --Continue cardiac medications as reconciled in final medication list. --Return in about 3 months (around 01/19/2022) for Follow up, CAD. or sooner if needed. --Continue follow-up with your primary care physician regarding the management of your other chronic comorbid conditions.  Patient's questions and concerns were addressed to his satisfaction. He voices understanding of the instructions provided during this encounter.   This note was created using a voice recognition software as a result there may be grammatical errors inadvertently enclosed that do not reflect the nature of this encounter. Every attempt is made to correct such errors.  Rex Kras, [place], [facility]  Pager: [phone] Office: [phone]

## 2021-11-02 ENCOUNTER — Other Ambulatory Visit: Payer: 59

## 2021-12-20 ENCOUNTER — Emergency Department (HOSPITAL_BASED_OUTPATIENT_CLINIC_OR_DEPARTMENT_OTHER)
Admission: EM | Admit: 2021-12-20 | Discharge: 2021-12-20 | Disposition: A | Discharge status: Home / Self Care | Payer: 59 | Attending: Emergency Medicine | Admitting: Emergency Medicine

## 2021-12-20 ENCOUNTER — Other Ambulatory Visit: Payer: Self-pay

## 2021-12-20 ENCOUNTER — Encounter (HOSPITAL_BASED_OUTPATIENT_CLINIC_OR_DEPARTMENT_OTHER): Payer: Self-pay | Admitting: Emergency Medicine

## 2021-12-20 ENCOUNTER — Emergency Department (HOSPITAL_BASED_OUTPATIENT_CLINIC_OR_DEPARTMENT_OTHER): Payer: 59

## 2021-12-20 DIAGNOSIS — R072 Precordial pain: Secondary | ICD-10-CM

## 2021-12-20 DIAGNOSIS — Z7982 Long term (current) use of aspirin: Secondary | ICD-10-CM | POA: Insufficient documentation

## 2021-12-20 DIAGNOSIS — R0789 Other chest pain: Secondary | ICD-10-CM | POA: Diagnosis present

## 2021-12-20 LAB — BASIC METABOLIC PANEL
Anion gap: 6 (ref 5–15)
BUN: 22 mg/dL — ABNORMAL HIGH (ref 6–20)
CO2: 26 mmol/L (ref 22–32)
Calcium: 8.7 mg/dL — ABNORMAL LOW (ref 8.9–10.3)
Chloride: 104 mmol/L (ref 98–111)
Creatinine, Ser: 0.78 mg/dL (ref 0.61–1.24)
GFR, Estimated: >60 mL/min (ref >60)
Glucose, Bld: 176 mg/dL — ABNORMAL HIGH (ref 70–99)
Potassium: 3.8 mmol/L (ref 3.5–5.1)
Sodium: 136 mmol/L (ref 135–145)

## 2021-12-20 LAB — TROPONIN I (HIGH SENSITIVITY)
Troponin I (High Sensitivity): 3 ng/L (ref <18)
Troponin I (High Sensitivity): <2 ng/L (ref <18)

## 2021-12-20 LAB — CBC WITH DIFFERENTIAL/PLATELET
Abs Immature Granulocytes: 0.04 10*3/uL (ref 0.00–0.07)
Basophils Absolute: 0.1 10*3/uL (ref 0.0–0.1)
Basophils Relative: 1 %
Eosinophils Absolute: 0.3 10*3/uL (ref 0.0–0.5)
Eosinophils Relative: 3 %
HCT: 43.1 % (ref 39.0–52.0)
Hemoglobin: 14.2 g/dL (ref 13.0–17.0)
Immature Granulocytes: 0 %
Lymphocytes Relative: 27 %
Lymphs Abs: 2.6 10*3/uL (ref 0.7–4.0)
MCH: 28.4 pg (ref 26.0–34.0)
MCHC: 32.9 g/dL (ref 30.0–36.0)
MCV: 86.2 fL (ref 80.0–100.0)
Monocytes Absolute: 0.6 10*3/uL (ref 0.1–1.0)
Monocytes Relative: 6 %
Neutro Abs: 5.9 10*3/uL (ref 1.7–7.7)
Neutrophils Relative %: 63 %
Platelets: 171 10*3/uL (ref 150–400)
RBC: 5 MIL/uL (ref 4.22–5.81)
RDW: 17.1 % — ABNORMAL HIGH (ref 11.5–15.5)
WBC: 9.5 10*3/uL (ref 4.0–10.5)
nRBC: 0 % (ref 0.0–0.2)

## 2021-12-20 MED ORDER — ASPIRIN 81 MG PO CHEW
324.0000 mg | CHEWABLE_TABLET | Freq: Once | ORAL | Status: AC
  Administered 2021-12-20: 324 mg via ORAL
  Filled 2021-12-20: qty 4

## 2021-12-20 MED ORDER — ALUM & MAG HYDROXIDE-SIMETH 200-200-20 MG/5ML PO SUSP
30.0000 mL | Freq: Once | ORAL | Status: AC
  Administered 2021-12-20: 30 mL via ORAL
  Filled 2021-12-20: qty 30

## 2021-12-20 MED ORDER — HEPARIN (PORCINE) 25000 UT/250ML-% IV SOLN: 12.0000 [IU]/kg/h | INTRAVENOUS | Status: DC

## 2021-12-20 MED ORDER — KETOROLAC TROMETHAMINE 30 MG/ML IJ SOLN
30.0000 mg | Freq: Once | INTRAMUSCULAR | Status: AC
  Administered 2021-12-20: 30 mg via INTRAVENOUS
  Filled 2021-12-20: qty 1

## 2021-12-20 MED ORDER — NITROGLYCERIN 2 % TD OINT: 0.5000 [in_us] | TOPICAL_OINTMENT | Freq: Once | TRANSDERMAL | Status: DC

## 2021-12-20 MED ORDER — ONDANSETRON HCL 4 MG/2ML IJ SOLN
4.0000 mg | Freq: Once | INTRAMUSCULAR | Status: AC
  Administered 2021-12-20: 4 mg via INTRAVENOUS
  Filled 2021-12-20: qty 2

## 2021-12-20 NOTE — ED Provider Notes (Signed)
[facility] EMERGENCY DEPARTMENT Provider Note   CSN: [id number] Arrival date & time: 12/20/21  0210     History  Chief Complaint  Patient presents with   Chest Pain    [name] Holland is a 51 y.o. male.  The history is provided by the patient.  Chest Pain Pain location:  Substernal area Pain quality: dull   Pain radiates to:  L jaw Pain severity:  Moderate Onset quality:  Sudden Duration:  5 hours Timing:  Constant Progression:  Unchanged Chronicity:  New Context: at rest   Relieved by:  Nothing Worsened by:  Nothing Ineffective treatments:  None tried Associated symptoms comment:  Vomited x 2 first and then had chest pain thereafter.   Patient with stent presents with vomiting x 2 and then chest pain.       Home Medications Prior to Admission medications   Medication Sig Start Date End Date Taking? Authorizing Provider  aspirin 325 MG tablet Take 325 mg by mouth every morning. 10/13/21   [provider]  metFORMIN (GLUCOPHAGE-XR) 500 MG 24 hr tablet Take 1 tablet by mouth daily at 12 noon. 10/13/21   [provider]  Multiple Vitamins-Minerals (MULTI COMPLETE PO) Take 1 tablet by mouth daily at 12 noon.    [provider]  nitroGLYCERIN (NITROSTAT) 0.4 MG SL tablet Place 0.4 mg under the tongue every 5 (five) minutes as needed for chest pain.    [provider]  Omega 3-6-9 Fatty Acids (OMEGA-3-6-9 PO) Take 1 tablet by mouth daily at 12 noon.    [provider]  rosuvastatin (CRESTOR) 20 MG tablet Take 1 tablet (20 mg total) by mouth at bedtime. 10/19/21 01/17/22  Tolia, Sunit, DO  sertraline (ZOLOFT) 25 MG tablet Take 25 mg by mouth every morning. 10/16/21   [provider]  thiamine (VITAMIN B1) 100 MG tablet Take 100 mg by mouth every morning. 10/12/21   [provider]  traZODone (DESYREL) 50 MG tablet Take 50 mg by mouth at bedtime as needed. 10/16/21   [provider]      Allergies     Formaldehyde    Review of Systems   Review of Systems  Cardiovascular:  Positive for chest pain.    Physical Exam Updated Vital Signs BP 122/63   Pulse 70   Temp 98.4 F (36.9 C) (Oral)   Resp (!) 24   Ht 6\' 1"  (1.854 m)   Wt (!) 140.6 kg   SpO2 98%   BMI 40.90 kg/m  Physical Exam  ED Results / Procedures / Treatments   Labs (all labs ordered are listed, but only abnormal results are displayed) Results for orders placed or performed during the hospital encounter of 12/20/21  CBC with Differential  Result Value Ref Range   WBC 9.5 4.0 - 10.5 K/uL   RBC 5.00 4.22 - 5.81 MIL/uL   Hemoglobin 14.2 13.0 - 17.0 g/dL   HCT 14/10/23 35.3 - 29.9 %   MCV 86.2 80.0 - 100.0 fL   MCH 28.4 26.0 - 34.0 pg   MCHC 32.9 30.0 - 36.0 g/dL   RDW 24.2 (H) 68.3 - 41.9 %   Platelets 171 150 - 400 K/uL   nRBC 0.0 0.0 - 0.2 %   Neutrophils Relative % 63 %   Neutro Abs 5.9 1.7 - 7.7 K/uL   Lymphocytes Relative 27 %   Lymphs Abs 2.6 0.7 - 4.0 K/uL   Monocytes Relative 6 %   Monocytes  Absolute 0.6 0.1 - 1.0 K/uL   Eosinophils Relative 3 %   Eosinophils Absolute 0.3 0.0 - 0.5 K/uL   Basophils Relative 1 %   Basophils Absolute 0.1 0.0 - 0.1 K/uL   Immature Granulocytes 0 %   Abs Immature Granulocytes 0.04 0.00 - 0.07 K/uL  Basic metabolic panel  Result Value Ref Range   Sodium 136 135 - 145 mmol/L   Potassium 3.8 3.5 - 5.1 mmol/L   Chloride 104 98 - 111 mmol/L   CO2 26 22 - 32 mmol/L   Glucose, Bld 176 (H) 70 - 99 mg/dL   BUN 22 (H) 6 - 20 mg/dL   Creatinine, Ser 6.44 0.61 - 1.24 mg/dL   Calcium 8.7 (L) 8.9 - 10.3 mg/dL   GFR, Estimated >03 >47 mL/min   Anion gap 6 5 - 15  Troponin I (High Sensitivity)  Result Value Ref Range   Troponin I (High Sensitivity) 3 <18 ng/L  Troponin I (High Sensitivity)  Result Value Ref Range   Troponin I (High Sensitivity) <2 <18 ng/L   DG Chest Portable 1 View  Result Date: 12/20/2021 CLINICAL DATA:  Chest pain EXAM: PORTABLE CHEST 1 VIEW  COMPARISON:  12/22/2020 FINDINGS: The heart size and mediastinal contours are within normal limits. Both lungs are clear. The visualized skeletal structures are unremarkable. IMPRESSION: No active disease. Electronically Signed   By: Alcide Clever M.D.   On: 12/20/2021 03:02    EKG EKG Interpretation  Date/Time:  Sunday December 20 2021 02:21:08 EST Ventricular Rate:  79 PR Interval:    QRS Duration: 104 QT Interval:  368 QTC Calculation: 422 R Axis:   88 Text Interpretation: Normal sinus rhythm Confirmed by Nicanor Alcon, Rani Idler ([id number]) on 12/20/2021 2:29:20 AM  Radiology DG Chest Portable 1 View  Result Date: 12/20/2021 CLINICAL DATA:  Chest pain EXAM: PORTABLE CHEST 1 VIEW COMPARISON:  12/22/2020 FINDINGS: The heart size and mediastinal contours are within normal limits. Both lungs are clear. The visualized skeletal structures are unremarkable. IMPRESSION: No active disease. Electronically Signed   By: Alcide Clever M.D.   On: 12/20/2021 03:02    Procedures Procedures    Medications Ordered in ED Medications  ondansetron (ZOFRAN) injection 4 mg (4 mg Intravenous Given 12/20/21 0258)  alum & mag hydroxide-simeth (MAALOX/MYLANTA) 200-200-20 MG/5ML suspension 30 mL (30 mLs Oral Given 12/20/21 0258)  aspirin chewable tablet 324 mg (324 mg Oral Given 12/20/21 0514)  ketorolac (TORADOL) 30 MG/ML injection 30 mg (30 mg Intravenous Given 12/20/21 0514)    ED Course/ Medical Decision Making/ A&P                           Medical Decision Making Vomited x 2 and then had chest pain at rest   Amount and/or Complexity of Data Reviewed External Data Reviewed: labs, ECG and notes.    Details: Previous notes and EKG and labs reviewed  Labs: ordered.    Details: All labs reviewed:  normal white count, hemoglobin is 14.2 and normal, normal platelet count.  Normal sodium 136, normal potassium 3.8, normal creatinine.  Troponin 3 and then < 2.   Radiology: ordered and independent interpretation  performed.    Details: Negative cxr by me  ECG/medicine tests: ordered and independent interpretation performed. Decision-making details documented in ED Course. Discussion of management or test interpretation with external provider(s): Case d/w Dr. Melton Alar of [facility] cardiology.  Will have office call patient Monday to be seen  ASAP.    Risk OTC drugs. Prescription drug management. Risk Details: 2 negative troponins and 2 normal EKGs excludes ACS,  I suspect that today's episode may be related to GERD but patient is not optimally managed medication wise.  I have discussed care with cardiology who will call patient for close follow up.  I do not believe this is a PE.  Stable for discharge.  Avoid greasy or spicy foods.  Strict return given.      Final Clinical Impression(s) / ED Diagnoses Final diagnoses:  None   Return for intractable cough, coughing up blood, fevers > 100.4 unrelieved by medication, shortness of breath, intractable vomiting, chest pain, shortness of breath, weakness, numbness, changes in speech, facial asymmetry, abdominal pain, passing out, Inability to tolerate liquids or food, cough, altered mental status or any concerns. No signs of systemic illness or infection. The patient is nontoxic-appearing on exam and vital signs are within normal limits.  I have reviewed the triage vital signs and the nursing notes. Pertinent labs & imaging results that were available during my care of the patient were reviewed by me and considered in my medical decision making (see chart for details). After history, exam, and medical workup I feel the patient has been appropriately medically screened and is safe for discharge home. Pertinent diagnoses were discussed with the patient. Patient was given return precautions.  Rx / DC Orders ED Discharge Orders     None         Kaleeya Hancock, MD 12/20/21 7824

## 2021-12-20 NOTE — ED Triage Notes (Addendum)
Pt states he was on the way to ED for HA, and vomiting but began having chest pain on the way that radiated to his jaw and between shoulder blades. Denies lightheadedness, weakness or diaphoresis.

## 2021-12-21 ENCOUNTER — Other Ambulatory Visit: Payer: 59

## 2022-04-20 ENCOUNTER — Ambulatory Visit: Payer: Self-pay | Admitting: Cardiology

## 2022-04-26 ENCOUNTER — Ambulatory Visit: Payer: Self-pay | Admitting: Cardiology

## 2022-07-21 ENCOUNTER — Encounter (HOSPITAL_BASED_OUTPATIENT_CLINIC_OR_DEPARTMENT_OTHER): Payer: Self-pay | Admitting: Emergency Medicine

## 2022-07-21 ENCOUNTER — Emergency Department (HOSPITAL_BASED_OUTPATIENT_CLINIC_OR_DEPARTMENT_OTHER)
Admission: EM | Admit: 2022-07-21 | Discharge: 2022-07-21 | Disposition: A | Discharge status: Home / Self Care | Payer: Self-pay | Attending: Emergency Medicine | Admitting: Emergency Medicine

## 2022-07-21 ENCOUNTER — Emergency Department (HOSPITAL_BASED_OUTPATIENT_CLINIC_OR_DEPARTMENT_OTHER): Payer: Self-pay

## 2022-07-21 ENCOUNTER — Other Ambulatory Visit: Payer: Self-pay

## 2022-07-21 DIAGNOSIS — M545 Low back pain, unspecified: Secondary | ICD-10-CM | POA: Insufficient documentation

## 2022-07-21 DIAGNOSIS — Z7982 Long term (current) use of aspirin: Secondary | ICD-10-CM | POA: Insufficient documentation

## 2022-07-21 DIAGNOSIS — N201 Calculus of ureter: Secondary | ICD-10-CM | POA: Insufficient documentation

## 2022-07-21 LAB — CBC
HCT: 48.7 % (ref 39.0–52.0)
Hemoglobin: 16.3 g/dL (ref 13.0–17.0)
MCH: 29 pg (ref 26.0–34.0)
MCHC: 33.5 g/dL (ref 30.0–36.0)
MCV: 86.5 fL (ref 80.0–100.0)
Platelets: 185 10*3/uL (ref 150–400)
RBC: 5.63 MIL/uL (ref 4.22–5.81)
RDW: 16.7 % — ABNORMAL HIGH (ref 11.5–15.5)
WBC: 9.2 10*3/uL (ref 4.0–10.5)
nRBC: 0 % (ref 0.0–0.2)

## 2022-07-21 LAB — BASIC METABOLIC PANEL
Anion gap: 9 (ref 5–15)
BUN: 21 mg/dL — ABNORMAL HIGH (ref 6–20)
CO2: 24 mmol/L (ref 22–32)
Calcium: 9.8 mg/dL (ref 8.9–10.3)
Chloride: 102 mmol/L (ref 98–111)
Creatinine, Ser: 0.95 mg/dL (ref 0.61–1.24)
GFR, Estimated: >60 mL/min (ref >60)
Glucose, Bld: 109 mg/dL — ABNORMAL HIGH (ref 70–99)
Potassium: 4.3 mmol/L (ref 3.5–5.1)
Sodium: 135 mmol/L (ref 135–145)

## 2022-07-21 LAB — URINALYSIS, ROUTINE W REFLEX MICROSCOPIC
Bacteria, UA: NONE SEEN
Bilirubin Urine: NEGATIVE
Glucose, UA: NEGATIVE mg/dL
Ketones, ur: NEGATIVE mg/dL
Leukocytes,Ua: NEGATIVE
Nitrite: NEGATIVE
Protein, ur: 100 mg/dL — AB
RBC / HPF: >50 RBC/hpf (ref 0–5)
Specific Gravity, Urine: 1.035 — ABNORMAL HIGH (ref 1.005–1.030)
pH: 5.5 (ref 5.0–8.0)

## 2022-07-21 MED ORDER — ONDANSETRON HCL 4 MG/2ML IJ SOLN
4.0000 mg | Freq: Once | INTRAMUSCULAR | Status: AC
  Administered 2022-07-21: 4 mg via INTRAVENOUS
  Filled 2022-07-21: qty 2

## 2022-07-21 MED ORDER — KETOROLAC TROMETHAMINE 30 MG/ML IJ SOLN
30.0000 mg | Freq: Once | INTRAMUSCULAR | Status: AC
  Administered 2022-07-21: 30 mg via INTRAVENOUS
  Filled 2022-07-21: qty 1

## 2022-07-21 MED ORDER — CELECOXIB 200 MG PO CAPS: 200.0000 mg | ORAL_CAPSULE | Freq: Two times a day (BID) | ORAL | 0 refills | Status: AC

## 2022-07-21 MED ORDER — METHOCARBAMOL 500 MG PO TABS: 500.0000 mg | ORAL_TABLET | Freq: Three times a day (TID) | ORAL | 0 refills | Status: AC | PRN

## 2022-07-21 NOTE — ED Provider Notes (Signed)
[place] EMERGENCY DEPARTMENT AT [facility] Provider Note   CSN: [id number] Arrival date & time: 07/21/22  1340     History  Chief Complaint  Patient presents with   Flank Pain    [name] Holland is a 52 y.o. male who presents with R low back pain.  He had onset of symptoms several weeks ago.  Symptoms are sharp.  They radiate to his right shoulder and to his jaw.  Worse with movement, change in position, bending over to tie his shoe.  He has no known inciting injuries but does have swelling in the left knee which is chronic.  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  He denies left-sided back pain, urinary symptoms, frequency or urgency.Denies weakness, loss of bowel/bladder function or saddle anesthesia. Denies neck stiffness, headache, rash.  Denies fever or recent procedures to back.    Flank Pain       Home Medications Prior to Admission medications   Medication Sig Start Date End Date Taking? Authorizing Provider  aspirin 325 MG tablet Take 325 mg by mouth every morning. 10/13/21   [provider]  metFORMIN (GLUCOPHAGE-XR) 500 MG 24 hr tablet Take 1 tablet by mouth daily at 12 noon. 10/13/21   [provider]  Multiple Vitamins-Minerals (MULTI COMPLETE PO) Take 1 tablet by mouth daily at 12 noon.    [provider]  nitroGLYCERIN (NITROSTAT) 0.4 MG SL tablet Place 0.4 mg under the tongue every 5 (five) minutes as needed for chest pain.    [provider]  Omega 3-6-9 Fatty Acids (OMEGA-3-6-9 PO) Take 1 tablet by mouth daily at 12 noon.    [provider]  rosuvastatin (CRESTOR) 20 MG tablet Take 1 tablet (20 mg total) by mouth at bedtime. 10/19/21 01/17/22  Tolia, Sunit, DO  sertraline (ZOLOFT) 25 MG tablet Take 25 mg by mouth every morning. 10/16/21   [provider]  thiamine (VITAMIN B1) 100 MG tablet Take 100 mg by mouth every morning. 10/12/21   [provider]  traZODone (DESYREL) 50 MG tablet Take 50 mg by mouth at bedtime as needed. 10/16/21   [provider]      Allergies    Formaldehyde    Review of Systems   Review of Systems  Genitourinary:  Positive for flank pain.    Physical Exam Updated Vital Signs BP (!) 125/57   Pulse 89   Temp 98.4 F (36.9 C)   Resp (!) 21   Ht 6\' 1"  (1.854 m)   Wt (!) 142.9 kg   SpO2 95%   BMI 41.56 kg/m  Physical Exam Vitals and nursing note reviewed.  Constitutional:      General: He is not in acute distress.    Appearance: He is well-developed. He is not diaphoretic.  HENT:     Head: Normocephalic and atraumatic.  Eyes:     General: No scleral icterus.    Conjunctiva/sclera: Conjunctivae normal.  Cardiovascular:     Rate and Rhythm: Normal rate and regular rhythm.     Heart sounds: Normal heart sounds.  Pulmonary:     Effort: Pulmonary effort is normal. No respiratory distress.     Breath sounds: Normal breath sounds.  Abdominal:     General: There is no distension.     Palpations: Abdomen is soft.     Tenderness: There is no abdominal tenderness. There is no right CVA tenderness or left  CVA tenderness.  Musculoskeletal:     Cervical back: Normal range of motion and neck supple.     Comments: Pain in the lumbar region with twisting moving or bending.  Pain with changing position.  Tender to palpation bilateral QL's right greater than left.  He has active fasciculation with palpation of the spastic tissue.  Normal lower extremity strength.  Skin:    General: Skin is warm and dry.  Neurological:     Mental Status: He is alert.  Psychiatric:        Behavior: Behavior normal.     ED Results / Procedures / Treatments   Labs (all labs ordered are listed, but only abnormal results are displayed) Labs Reviewed  URINALYSIS, ROUTINE W REFLEX MICROSCOPIC - Abnormal; Notable for the following components:      Result Value   APPearance HAZY (*)    Specific Gravity,  Urine 1.035 (*)    Hgb urine dipstick LARGE (*)    Protein, ur 100 (*)    All other components within normal limits  BASIC METABOLIC PANEL - Abnormal; Notable for the following components:   Glucose, Bld 109 (*)    BUN 21 (*)    All other components within normal limits  CBC - Abnormal; Notable for the following components:   RDW 16.7 (*)    All other components within normal limits    EKG None  Radiology CT Renal Stone Study  Result Date: 07/21/2022 CLINICAL DATA:  Flank pain EXAM: CT ABDOMEN AND PELVIS WITHOUT CONTRAST TECHNIQUE: Multidetector CT imaging of the abdomen and pelvis was performed following the standard protocol without IV contrast. RADIATION DOSE REDUCTION: This exam was performed according to the departmental dose-optimization program which includes automated exposure control, adjustment of the mA and/or kV according to patient size and/or use of iterative reconstruction technique. COMPARISON:  CT abdomen and pelvis dated December 26, 2017 FINDINGS: Lower chest: No acute abnormality. Hepatobiliary: Hepatic steatosis. No focal liver abnormality is seen. Gallbladder is mildly distended with no evidence of gallbladder wall thickening. No biliary ductal dilation. Pancreas: Unremarkable. No pancreatic ductal dilatation or surrounding inflammatory changes. Spleen: Normal in size without focal abnormality. Adrenals/Urinary Tract: Bilateral adrenal glands are unremarkable. Tiny stone of the mid left ureter measuring 2 mm. Hydronephrosis or hydroureter. Punctate nonobstructing stone of the lower pole of the left kidney. Bladder is unremarkable. Stomach/Bowel: Stomach is within normal limits. Appendix appears normal. No evidence of bowel wall thickening, distention, or inflammatory changes. Vascular/Lymphatic: Mild aortic atherosclerosis. No enlarged abdominal or pelvic lymph nodes. Reproductive: Prostate is unremarkable. Other: Small right-greater-than-left fat containing inguinal hernias.  Small fat containing paraumbilical hernia. Musculoskeletal: No acute or significant osseous findings. IMPRESSION: 1. Tiny stone of the mid left ureter measuring 2 mm. No hydronephrosis or hydroureter. 2. Punctate nonobstructing stone of the lower pole of the left kidney. 3. Hepatic steatosis. Electronically Signed   By: Allegra Lai M.D.   On: 07/21/2022 16:46    Procedures Procedures    Medications Ordered in ED Medications  ketorolac (TORADOL) 30 MG/ML injection 30 mg (30 mg Intravenous Given 07/21/22 1703)  ondansetron (ZOFRAN) injection 4 mg (4 mg Intravenous Given 07/21/22 1703)    ED Course/ Medical Decision Making/ A&P                             Medical Decision Making Amount and/or Complexity of Data Reviewed Labs: ordered.  Risk Prescription drug management.   Patient  here with lumbar region back pain.  This is more consistent with musculoskeletal low back pain then with renal stone.  I think however the workup showing a left-sided renal stone was just a chance finding.  He has a previous history of renal stones.  I visualized the CT renal stone study ordered in triage which shows a punctate left ureteral stone.  He has no CVA tenderness.  I visualized interpreted patient's labs showed large hemoglobin without evidence of infection. I believe patient is having musculoskeletal low back pain based on my physical examination.  Will treat with anti-inflammatories, muscle relaxers and referral to supportive and alternative care practitioners.  Suggest PT.  Patient appears otherwise appropriate for discharge at this time       Final Clinical Impression(s) / ED Diagnoses Final diagnoses:  None    Rx / DC Orders ED Discharge Orders     None         Arthor Captain, PA-C 07/21/22 1800    Arby Barrette, MD 07/22/22 1936

## 2022-07-21 NOTE — Discharge Instructions (Addendum)
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  It could be helpful to follow closely with a physical therapist.  There are multiple other practitioners who can help with this disorder including massage therapist.  [facility] (massage therapy) Located in: [place] Address: [address], [place], [place] [address] Hours:  Open ? Closes 8?PM Phone: [phone]  [facility] [address] [place] [email or web address]  [phone] [email or web address] Book Online [email or web address] (Dry needling and massage therapy)  [facility] Dr[facility] Reino Kent Call Now: [phone] Fax: [phone] Email [place]: [email or web address]

## 2022-07-21 NOTE — ED Notes (Signed)
Pt discharged in stable condition. Pt and companion expressed understanding about discharge instructions and Rx and to follow up with pcp and to return to ER for any further concerns or complications.

## 2022-07-21 NOTE — ED Provider Notes (Incomplete)
[place] EMERGENCY DEPARTMENT AT [facility] Provider Note   CSN: [id number] Arrival date & time: 07/21/22  1340     History {Add pertinent medical, surgical, social history, OB history to HPI:1} Chief Complaint  Patient presents with  . Flank Pain    [name] Holland is a 52 y.o. male.   Flank Pain      Home Medications Prior to Admission medications   Medication Sig Start Date End Date Taking? Authorizing Provider  aspirin 325 MG tablet Take 325 mg by mouth every morning. 10/13/21   [provider]  metFORMIN (GLUCOPHAGE-XR) 500 MG 24 hr tablet Take 1 tablet by mouth daily at 12 noon. 10/13/21   [provider]  Multiple Vitamins-Minerals (MULTI COMPLETE PO) Take 1 tablet by mouth daily at 12 noon.    [provider]  nitroGLYCERIN (NITROSTAT) 0.4 MG SL tablet Place 0.4 mg under the tongue every 5 (five) minutes as needed for chest pain.    [provider]  Omega 3-6-9 Fatty Acids (OMEGA-3-6-9 PO) Take 1 tablet by mouth daily at 12 noon.    [provider]  rosuvastatin (CRESTOR) 20 MG tablet Take 1 tablet (20 mg total) by mouth at bedtime. 10/19/21 01/17/22  Tolia, Sunit, DO  sertraline (ZOLOFT) 25 MG tablet Take 25 mg by mouth every morning. 10/16/21   [provider]  thiamine (VITAMIN B1) 100 MG tablet Take 100 mg by mouth every morning. 10/12/21   [provider]  traZODone (DESYREL) 50 MG tablet Take 50 mg by mouth at bedtime as needed. 10/16/21   [provider]      Allergies    Formaldehyde    Review of Systems   Review of Systems  Genitourinary:  Positive for flank pain.    Physical Exam Updated Vital Signs BP (!) 125/57   Pulse 89   Temp 98.4 F (36.9 C)   Resp (!) 21   Ht 6\' 1"  (1.854 m)   Wt (!) 142.9 kg   SpO2 95%   BMI 41.56 kg/m  Physical Exam  ED Results / Procedures / Treatments   Labs (all labs ordered are listed, but only abnormal results are displayed) Labs  Reviewed  URINALYSIS, ROUTINE W REFLEX MICROSCOPIC - Abnormal; Notable for the following components:      Result Value   APPearance HAZY (*)    Specific Gravity, Urine 1.035 (*)    Hgb urine dipstick LARGE (*)    Protein, ur 100 (*)    All other components within normal limits  BASIC METABOLIC PANEL - Abnormal; Notable for the following components:   Glucose, Bld 109 (*)    BUN 21 (*)    All other components within normal limits  CBC - Abnormal; Notable for the following components:   RDW 16.7 (*)    All other components within normal limits    EKG None  Radiology CT Renal Stone Study  Result Date: 07/21/2022 CLINICAL DATA:  Flank pain EXAM: CT ABDOMEN AND PELVIS WITHOUT CONTRAST TECHNIQUE: Multidetector CT imaging of the abdomen and pelvis was performed following the standard protocol without IV contrast. RADIATION DOSE REDUCTION: This exam was performed according to the departmental dose-optimization program which includes automated exposure control, adjustment of the mA and/or kV according to patient size and/or use of iterative reconstruction technique. COMPARISON:  CT abdomen and pelvis dated December 26, 2017 FINDINGS: Lower chest: No acute abnormality. Hepatobiliary: Hepatic steatosis. No focal liver abnormality is seen. Gallbladder is mildly distended  with no evidence of gallbladder wall thickening. No biliary ductal dilation. Pancreas: Unremarkable. No pancreatic ductal dilatation or surrounding inflammatory changes. Spleen: Normal in size without focal abnormality. Adrenals/Urinary Tract: Bilateral adrenal glands are unremarkable. Tiny stone of the mid left ureter measuring 2 mm. Hydronephrosis or hydroureter. Punctate nonobstructing stone of the lower pole of the left kidney. Bladder is unremarkable. Stomach/Bowel: Stomach is within normal limits. Appendix appears normal. No evidence of bowel wall thickening, distention, or inflammatory changes. Vascular/Lymphatic: Mild aortic  atherosclerosis. No enlarged abdominal or pelvic lymph nodes. Reproductive: Prostate is unremarkable. Other: Small right-greater-than-left fat containing inguinal hernias. Small fat containing paraumbilical hernia. Musculoskeletal: No acute or significant osseous findings. IMPRESSION: 1. Tiny stone of the mid left ureter measuring 2 mm. No hydronephrosis or hydroureter. 2. Punctate nonobstructing stone of the lower pole of the left kidney. 3. Hepatic steatosis. Electronically Signed   By: Allegra Lai M.D.   On: 07/21/2022 16:46    Procedures Procedures  {Document cardiac monitor, telemetry assessment procedure when appropriate:1}  Medications Ordered in ED Medications  ketorolac (TORADOL) 30 MG/ML injection 30 mg (30 mg Intravenous Given 07/21/22 1703)  ondansetron (ZOFRAN) injection 4 mg (4 mg Intravenous Given 07/21/22 1703)    ED Course/ Medical Decision Making/ A&P   {   Click here for ABCD2, HEART and other calculatorsREFRESH Note before signing :1}                          Medical Decision Making Amount and/or Complexity of Data Reviewed Labs: ordered.  Risk Prescription drug management.   ***  {Document critical care time when appropriate:1} {Document review of labs and clinical decision tools ie heart score, Chads2Vasc2 etc:1}  {Document your independent review of radiology images, and any outside records:1} {Document your discussion with family members, caretakers, and with consultants:1} {Document social determinants of health affecting pt's care:1} {Document your decision making why or why not admission, treatments were needed:1} Final Clinical Impression(s) / ED Diagnoses Final diagnoses:  None    Rx / DC Orders ED Discharge Orders     None

## 2022-07-21 NOTE — ED Triage Notes (Signed)
Patient arrives ambulatory by POV c/o right sided flank/side pain x 1 week and worsened on Sunday. Denies any urinary symptoms.

## 2022-09-17 NOTE — Progress Notes (Signed)
This encounter was created in error - please disregard.

## 2023-02-10 ENCOUNTER — Encounter: Payer: Self-pay | Admitting: Gastroenterology

## 2023-02-14 ENCOUNTER — Telehealth: Payer: Self-pay

## 2023-02-14 NOTE — Telephone Encounter (Signed)
During call, patient reports he is not taking Ozempic either;

## 2023-02-14 NOTE — Telephone Encounter (Signed)
Called and spoke with patient- patient reports he has not taken ELIQUIS or PLAVIX in over 1 yr;  this information has been placed on PV chart;

## 2023-02-17 ENCOUNTER — Ambulatory Visit (AMBULATORY_SURGERY_CENTER): Payer: Medicaid Other

## 2023-02-17 VITALS — Ht 73.0 in | Wt 335.0 lb

## 2023-02-17 DIAGNOSIS — Z1211 Encounter for screening for malignant neoplasm of colon: Secondary | ICD-10-CM

## 2023-02-17 MED ORDER — SUFLAVE 178.7 G PO SOLR: 1.0000 | ORAL | 0 refills | Status: DC

## 2023-02-17 NOTE — Progress Notes (Signed)
 No egg or soy allergy known to patient  No issues known to pt with past sedation with any surgeries or procedures Patient denies ever being told they had issues or difficulty with intubation  No FH of Malignant Hyperthermia Pt is on Ozempic, instructions for holding med provided Pt is not on  home 02  Pt is not on blood thinners  Pt denies issues with constipation  No A fib or A flutter Have any cardiac testing pending--No Pt can ambulate  Pt uses tobacco, hold instructions provided. Discussed diabetic I weight loss medication holds Discussed NSAID holds Checked BMI Pt instructed to use Singlecare.com or GoodRx for a price reduction on prep  Patient's chart reviewed by Norleen Schillings CNRA prior to previsit and patient appropriate for the [facility]. (Message sent to [facility] for review). Pre visit completed and red dot placed by patient's name on their procedure day (on provider's schedule).

## 2023-03-01 ENCOUNTER — Encounter: Payer: Self-pay | Admitting: Certified Registered Nurse Anesthetist

## 2023-03-02 ENCOUNTER — Telehealth: Payer: Self-pay | Admitting: Gastroenterology

## 2023-03-02 NOTE — Telephone Encounter (Signed)
Patient called and stated that he has a procedure tomorrow and would like to know if we are going to be open or closing due to the weather. Patient stated that he would like a call back later today before her starts his prep medication. Please advise.

## 2023-03-03 ENCOUNTER — Ambulatory Visit (AMBULATORY_SURGERY_CENTER): Payer: Medicaid Other | Admitting: Gastroenterology

## 2023-03-03 ENCOUNTER — Encounter: Payer: Self-pay | Admitting: Gastroenterology

## 2023-03-03 VITALS — BP 126/78 | HR 88 | Temp 98.3°F | Resp 14 | Ht 73.0 in | Wt 335.0 lb

## 2023-03-03 DIAGNOSIS — K5730 Diverticulosis of large intestine without perforation or abscess without bleeding: Secondary | ICD-10-CM

## 2023-03-03 DIAGNOSIS — K640 First degree hemorrhoids: Secondary | ICD-10-CM

## 2023-03-03 DIAGNOSIS — Z1211 Encounter for screening for malignant neoplasm of colon: Secondary | ICD-10-CM

## 2023-03-03 MED ORDER — SODIUM CHLORIDE 0.9 % IV SOLN: 500.0000 mL | INTRAVENOUS | Status: DC

## 2023-03-03 NOTE — Patient Instructions (Addendum)
 Handouts Provided:  Diverticulosis  REPEAT Colonoscopy in 10 years for screening purposes.   YOU HAD AN ENDOSCOPIC PROCEDURE TODAY AT THE [facility]:   Refer to the procedure report that was given to you for any specific questions about what was found during the examination.  If the procedure report does not answer your questions, please call your gastroenterologist to clarify.  If you requested that your care partner not be given the details of your procedure findings, then the procedure report has been included in a sealed envelope for you to review at your convenience later.  YOU SHOULD EXPECT: Some feelings of bloating in the abdomen. Passage of more gas than usual.  Walking can help get rid of the air that was put into your GI tract during the procedure and reduce the bloating. If you had a lower endoscopy (such as a colonoscopy or flexible sigmoidoscopy) you may notice spotting of blood in your stool or on the toilet paper. If you underwent a bowel prep for your procedure, you may not have a normal bowel movement for a few days.  Please Note:  You might notice some irritation and congestion in your nose or some drainage.  This is from the oxygen used during your procedure.  There is no need for concern and it should clear up in a day or so.  SYMPTOMS TO REPORT IMMEDIATELY:  Following lower endoscopy (colonoscopy or flexible sigmoidoscopy):  Excessive amounts of blood in the stool  Significant tenderness or worsening of abdominal pains  Swelling of the abdomen that is new, acute  Fever of 100F or higher  For urgent or emergent issues, a gastroenterologist can be reached at any hour by calling [phone]. Do not use MyChart messaging for urgent concerns.    DIET:  We do recommend a small meal at first, but then you may proceed to your regular diet.  Drink plenty of fluids but you should avoid alcoholic beverages for 24 hours.  ACTIVITY:  You should plan to take it easy  for the rest of today and you should NOT DRIVE or use heavy machinery until tomorrow (because of the sedation medicines used during the test).    FOLLOW UP: Our staff will call the number listed on your records the next business day following your procedure.  We will call around 7:15- 8:00 am to check on you and address any questions or concerns that you may have regarding the information given to you following your procedure. If we do not reach you, we will leave a message.     If any biopsies were taken you will be contacted by phone or by letter within the next 1-3 weeks.  Please call us at [phone] if you have not heard about the biopsies in 3 weeks.    SIGNATURES/CONFIDENTIALITY: You and/or your care partner have signed paperwork which will be entered into your electronic medical record.  These signatures attest to the fact that that the information above on your After Visit Summary has been reviewed and is understood.  Full responsibility of the confidentiality of this discharge information lies with you and/or your care-partner.

## 2023-03-03 NOTE — Progress Notes (Signed)
 Report given to PACU, vss

## 2023-03-03 NOTE — Progress Notes (Signed)
[facility] History and Physical   Primary Care Physician:  Patient, No Pcp Per   Reason for Procedure:   CRC screening  Plan:    colon     HPI: [name] Holland is a 53 y.o. 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   Hyperlipidemia    Intracranial and intraspinal phlebitis and thrombophlebitis 05/31/2014   Myocardial infarction (HCC) 2016   "caused by blood clot in the left side of my head"   Stroke (HCC) 05/2014   denies residual on 11/23/2016   Type II diabetes mellitus (HCC)     Past Surgical History:  Procedure Laterality Date   CARDIAC CATHETERIZATION  2008   CORONARY STENT INTERVENTION N/A 11/24/2016   Procedure: CORONARY STENT INTERVENTION;  Surgeon: Kathleene Hazel, MD;  Location: [facility] INVASIVE CV [facility];  Service: Cardiovascular;  Laterality: N/A;   CORONARY THROMBECTOMY N/A 11/24/2016   Procedure: Coronary Thrombectomy;  Surgeon: Kathleene Hazel, MD;  Location: [facility] CV [facility];  Service: Cardiovascular;  Laterality: N/A;   HIP SURGERY Right    KNEE ARTHROSCOPY Bilateral early 2000s   LEFT HEART CATH AND CORONARY ANGIOGRAPHY N/A 11/24/2016   Procedure: LEFT HEART CATH AND CORONARY ANGIOGRAPHY;  Surgeon: Kathleene Hazel, MD;  Location: [facility];  Service: Cardiovascular;  Laterality: N/A;   LEFT HEART CATH AND CORONARY ANGIOGRAPHY N/A 07/18/2017   Procedure: LEFT HEART CATH AND CORONARY ANGIOGRAPHY;  Surgeon: Yvonne Kendall, MD;  Location: [facility];  Service: Cardiovascular;  Laterality: N/A;   LEFT HEART CATH AND CORONARY ANGIOGRAPHY N/A  03/01/2019   Procedure: LEFT HEART CATH AND CORONARY ANGIOGRAPHY;  Surgeon: Swazil[place], Peter M, MD;  Location: [facility] INVASIVE CV LAB;  Service: Cardiovascular;  Laterality: N/A;   WRIST SURGERY Right     Prior to Admission medications   Medication Sig Start Date End Date Taking? Authorizing Provider  aspirin 325 MG tablet Take 325 mg by mouth every morning. Patient not taking: Reported on 02/17/2023 10/13/21   [provider]  aspirin EC 81 MG tablet Take 81 mg by mouth daily. 01/24/23   [provider]  celecoxib (CELEBREX) 200 MG capsule Take 1 capsule (200 mg total) by mouth 2 (two) times daily. Patient not taking: Reported on 02/17/2023 07/21/22   Arthor Captain, PA-C  metFORMIN (GLUCOPHAGE-XR) 500 MG 24 hr tablet Take 1 tablet by mouth daily at 12 noon. Patient not taking: Reported on 02/17/2023 10/13/21   [provider]  methocarbamol (ROBAXIN) 500 MG tablet Take 1 tablet (500 mg total) by mouth 3 (three) times daily as needed for muscle spasms. Patient not taking: Reported on 02/17/2023 07/21/22   Arthor Captain, PA-C  Multiple Vitamins-Minerals [facility] COMPLETE PO) Take 1 tablet by mouth daily at 12 noon. Patient not taking: Reported on 02/17/2023    [provider]  nitroGLYCERIN (NITROSTAT) 0.4 MG SL tablet Place 0.4 mg under the tongue every 5 (five) minutes as needed for chest pain. Patient not taking: Reported on 02/17/2023    [provider]  Omega 3-6-9 Fatty  Acids (OMEGA-3-6-9 PO) Take 1 tablet by mouth daily at 12 noon. Patient not taking: Reported on 02/17/2023    [provider]  OZEMPIC, 0.25 OR 0.5 MG/DOSE, 2 MG/3ML SOPN Inject 0.5 mg into the skin once a week. 01/19/23   [provider]  PEG 3350-KCl-NaCl-NaSulf-MgSul (SUFLAVE) 178.7 g SOLR Take 1 kit by mouth as directed. 02/17/23   Lynann Bologna, MD  rosuvastatin (CRESTOR) 20 MG tablet Take 1 tablet (20 mg total) by mouth at bedtime. Patient not taking: Reported on 02/17/2023 10/19/21  01/17/22  Tessa Lerner, DO  sertraline (ZOLOFT) 25 MG tablet Take 25 mg by mouth every morning. 10/16/21   [provider]  thiamine (VITAMIN B1) 100 MG tablet Take 100 mg by mouth every morning. Patient not taking: Reported on 02/17/2023 10/12/21   [provider]  traZODone (DESYREL) 50 MG tablet Take 50 mg by mouth at bedtime as needed. Patient not taking: Reported on 02/17/2023 10/16/21   [provider]    Current Outpatient Medications  Medication Sig Dispense Refill   aspirin 325 MG tablet Take 325 mg by mouth every morning. (Patient not taking: Reported on 02/17/2023)     aspirin EC 81 MG tablet Take 81 mg by mouth daily.     celecoxib (CELEBREX) 200 MG capsule Take 1 capsule (200 mg total) by mouth 2 (two) times daily. (Patient not taking: Reported on 02/17/2023) 20 capsule 0   metFORMIN (GLUCOPHAGE-XR) 500 MG 24 hr tablet Take 1 tablet by mouth daily at 12 noon. (Patient not taking: Reported on 02/17/2023)     methocarbamol (ROBAXIN) 500 MG tablet Take 1 tablet (500 mg total) by mouth 3 (three) times daily as needed for muscle spasms. (Patient not taking: Reported on 02/17/2023) 21 tablet 0   Multiple Vitamins-Minerals (MULTI COMPLETE PO) Take 1 tablet by mouth daily at 12 noon. (Patient not taking: Reported on 02/17/2023)     nitroGLYCERIN (NITROSTAT) 0.4 MG SL tablet Place 0.4 mg under the tongue every 5 (five) minutes as needed for chest pain. (Patient not taking: Reported on 02/17/2023)     Omega 3-6-9 Fatty Acids (OMEGA-3-6-9 PO) Take 1 tablet by mouth daily at 12 noon. (Patient not taking: Reported on 02/17/2023)     OZEMPIC, 0.25 OR 0.5 MG/DOSE, 2 MG/3ML SOPN Inject 0.5 mg into the skin once a week.     PEG 3350-KCl-NaCl-NaSulf-MgSul (SUFLAVE) 178.7 g SOLR Take 1 kit by mouth as directed. 1 each 0   rosuvastatin (CRESTOR) 20 MG tablet Take 1 tablet (20 mg total) by mouth at bedtime. (Patient not taking: Reported on 02/17/2023) 90 tablet 0   sertraline (ZOLOFT) 25 MG tablet  Take 25 mg by mouth every morning.     thiamine (VITAMIN B1) 100 MG tablet Take 100 mg by mouth every morning. (Patient not taking: Reported on 02/17/2023)     traZODone (DESYREL) 50 MG tablet Take 50 mg by mouth at bedtime as needed. (Patient not taking: Reported on 02/17/2023)     Current Facility-Administered Medications  Medication Dose Route Frequency Provider Last Rate Last Admin   0.9 %  sodium chloride infusion  500 mL Intravenous Continuous Lynann Bologna, MD        Allergies as of 03/03/2023 - Review Complete 03/03/2023  Allergen Reaction Noted   Formaldehyde Hives 10/21/2017    Family History  Problem Relation Age of Onset   Other Mother    Diabetes type II Mother    Asthma Mother    Diabetes Mother  CAD Father        CABG   Hypertension Father    Hyperlipidemia Father    Pancreatic cancer Maternal Aunt    Lung cancer Maternal Aunt    CAD Maternal Grandmother    Cerebral aneurysm Paternal Grandmother    Colon cancer Paternal Grandfather    Cancer Paternal Grandfather        unknown   Rectal cancer Neg Hx    Stomach cancer Neg Hx    Esophageal cancer Neg Hx     Social History   Socioeconomic History   Marital status: Single    Spouse name: Yadi   Number of children: 2   Years of education: Not on file   Highest education level: Not on file  Occupational History   Occupation: Development worker, community,     Employer: [employer]    Comment: substance abuse recovery  Tobacco Use   Smoking status: Former    Current packs/day: 0.00    Average packs/day: 1 pack/day for 24.0 years (24.0 ttl pk-yrs)    Types: Cigarettes    Start date: 62    Quit date: 2014    Years since quitting: 11.1   Smokeless tobacco: Current    Types: Snuff  Vaping Use   Vaping status: Never Used  Substance and Sexual Activity   Alcohol use: Never    Comment: occ   Drug use: Not Currently    Types: Cocaine    Comment: occ   Sexual activity: Not on file  Other Topics Concern   Not on  file  Social History Narrative   Married, 2 sons, substance abuse Museum/gallery exhibitions officer, sober from cocaine since 2014, exercises -weights, crossfit, trainer, healthy diet mostly (will intermittently eat poorly and regain weight), former smoker, chews tobacco,    Social Drivers of Corporate investment banker Strain: Low Risk  (01/24/2023)   Received from [facility]   Overall Financial Resource Strain (CARDIA)    Difficulty of Paying Living Expenses: Not hard at all  Food Insecurity: No Food Insecurity (01/24/2023)   [facility]   Hunger Vital Sign    Worried About Running Out of Food in the Last Year: Never true    Ran Out of Food in the Last Year: Never true  Transportation Needs: No Transportation Needs (01/24/2023)   Received from [facility] - Transportation    Lack of Transportation (Medical): No    Lack of Transportation (Non-Medical): No  Physical Activity: Sufficiently Active (12/08/2016)   Exercise Vital Sign    Days of Exercise per Week: 5 days    Minutes of Exercise per Session: 60 min  Stress: Stress Concern Present (12/08/2016)   [employer] of Occupational Health - Occupational Stress Questionnaire    Feeling of Stress : Rather much  Social Connections: Unknown (11/22/2021)   Received from [facility]   Social Network    Social Network: Not on file  Intimate Partner Violence: Unknown (11/22/2021)   [facility] from [facility]   HITS    Physically Hurt: Not on file    Insult or Talk Down To: Not on file    Threaten Physical Harm: Not on file    Scream or Curse: Not on file    Review of Systems: Positive for none All other review of systems negative except as mentioned in the HPI.  Physical Exam: Vital signs in last 24 hours: @VSRANGES @   General:  Alert,  Well-developed, well-nourished, pleasant and cooperative in NAD Lungs:  Clear throughout to auscultation.   Heart:  Regular rate and  rhythm; no murmurs, clicks, rubs,  or gallops. Abdomen:  Soft, nontender and nondistended. Normal bowel sounds.   Neuro/Psych:  Alert and cooperative. Normal mood and affect. A and O x 3    No significant changes were identified.  The patient continues to be an appropriate candidate for the planned procedure and anesthesia.   Edman Circle, MD. [facility] 03/03/2023 10:59 AM@

## 2023-03-03 NOTE — Progress Notes (Signed)
 Pt's states no medical or surgical changes since previsit or office visit.

## 2023-03-03 NOTE — Progress Notes (Signed)
1112 HR > 100 with esmolol 25 mg given IV, MD updated, vss

## 2023-03-03 NOTE — Op Note (Signed)
[facility] Patient Name: [name] Holland Procedure Date: 03/03/2023 10:54 AM MRN: [id number] Endoscopist: Lynann Bologna , MD, [id number] Age: 53 Referring MD:  Date of Birth: 06-05-1970 Gender: Male Account #: [id number] Procedure:                Colonoscopy Indications:              Screening for colorectal malignant neoplasm Medicines:                Monitored Anesthesia Care Procedure:                Pre-Anesthesia Assessment:                           - Prior to the procedure, a History and Physical                            was performed, and patient medications and                            allergies were reviewed. The patient's tolerance of                            previous anesthesia was also reviewed. The risks                            and benefits of the procedure and the sedation                            options and risks were discussed with the patient.                            All questions were answered, and informed consent                            was obtained. Prior Anticoagulants: The patient has                            taken no anticoagulant or antiplatelet agents. ASA                            Grade Assessment: III - A patient with severe                            systemic disease. After reviewing the risks and                            benefits, the patient was deemed in satisfactory                            condition to undergo the procedure.                           After obtaining informed consent, the colonoscope  was passed under direct vision. Throughout the                            procedure, the patient's blood pressure, pulse, and                            oxygen saturations were monitored continuously. The                            CF [id number] #[id number] was introduced through the anus                            and advanced to the the cecum, identified by                            appendiceal  orifice and ileocecal valve.                            Thereafter, 2 cm of TI was intubated. The                            colonoscopy was performed without difficulty. The                            patient tolerated the procedure well. The quality                            of the bowel preparation was good. The ileocecal                            valve, TI, appendiceal orifice, and rectum were                            photographed. Scope In: 11:13:39 AM Scope Out: 11:27:01 AM Scope Withdrawal Time: 0 hours 9 minutes 54 seconds  Total Procedure Duration: 0 hours 13 minutes 22 seconds  Findings:                 A few rare small-mouthed diverticula were found in                            the sigmoid colon.                           Non-bleeding internal hemorrhoids were found during                            retroflexion and during perianal exam. The                            hemorrhoids were small and Grade I (internal                            hemorrhoids that do not prolapse).  The exam was otherwise without abnormality on                            direct and retroflexion views. The terminal ileum                            was normal. Complications:            No immediate complications. Estimated Blood Loss:     Estimated blood loss: none. Impression:               - Minimal sigmoid diverticulosis.                           - Non-bleeding internal hemorrhoids.                           - The examination was otherwise normal on direct                            and retroflexion views.                           - No specimens collected. Recommendation:           - Patient has a contact number available for                            emergencies. The signs and symptoms of potential                            delayed complications were discussed with the                            patient. Return to normal activities tomorrow.                             Written discharge instructions were provided to the                            patient.                           - Resume previous diet.                           - Continue present medications.                           - Repeat colonoscopy in 10 years for screening                            purposes. Earlier, if with any new problems or                            change in family history.                           -  The findings and recommendations were discussed                            with the patient's family. Lynann Bologna, MD 03/03/2023 11:32:10 AM This report has been signed electronically.

## 2023-03-04 ENCOUNTER — Telehealth: Payer: Self-pay | Admitting: *Deleted

## 2023-03-04 NOTE — Telephone Encounter (Signed)
  Follow up Call-     03/03/2023   10:54 AM  Call back number  Post procedure Call Back phone  # [phone]  Permission to leave phone message Yes   [facility]
